# Patient Record
Sex: Female | Born: 1945
Health system: Southern US, Community
[De-identification: ages and names within clinical notes are randomized; demographics above are authoritative.]

## PROBLEM LIST (undated history)

## (undated) DIAGNOSIS — I8290 Acute embolism and thrombosis of unspecified vein: Secondary | ICD-10-CM

## (undated) DIAGNOSIS — J449 Chronic obstructive pulmonary disease, unspecified: Secondary | ICD-10-CM

## (undated) DIAGNOSIS — J45909 Unspecified asthma, uncomplicated: Secondary | ICD-10-CM

## (undated) DIAGNOSIS — I509 Heart failure, unspecified: Secondary | ICD-10-CM

## (undated) DIAGNOSIS — Z86711 Personal history of pulmonary embolism: Secondary | ICD-10-CM

## (undated) DIAGNOSIS — F319 Bipolar disorder, unspecified: Secondary | ICD-10-CM

## (undated) DIAGNOSIS — E7800 Pure hypercholesterolemia, unspecified: Secondary | ICD-10-CM

## (undated) DIAGNOSIS — F329 Major depressive disorder, single episode, unspecified: Secondary | ICD-10-CM

## (undated) DIAGNOSIS — F419 Anxiety disorder, unspecified: Secondary | ICD-10-CM

## (undated) DIAGNOSIS — F32A Depression, unspecified: Secondary | ICD-10-CM

## (undated) HISTORY — PX: EYE SURGERY: SHX253

## (undated) HISTORY — DX: Acute embolism and thrombosis of unspecified vein: I82.90

## (undated) HISTORY — DX: Depression, unspecified: F32.A

## (undated) HISTORY — PX: COLONOSCOPY: SHX174

## (undated) HISTORY — DX: Anxiety disorder, unspecified: F41.9

## (undated) HISTORY — DX: Pure hypercholesterolemia, unspecified: E78.00

## (undated) HISTORY — DX: Personal history of pulmonary embolism: Z86.711

## (undated) HISTORY — DX: Bipolar disorder, unspecified: F31.9

## (undated) HISTORY — PX: TONSILLECTOMY: SUR1361

## (undated) HISTORY — DX: Heart failure, unspecified: I50.9

## (undated) HISTORY — DX: Major depressive disorder, single episode, unspecified: F32.9

---

## 2008-01-01 ENCOUNTER — Ambulatory Visit: Payer: Self-pay | Admitting: Anesthesiology

## 2008-06-26 ENCOUNTER — Emergency Department: Payer: Self-pay | Admitting: Internal Medicine

## 2009-03-10 ENCOUNTER — Ambulatory Visit: Payer: Self-pay | Admitting: Anesthesiology

## 2011-09-11 HISTORY — PX: BREAST SURGERY: SHX581

## 2012-12-22 ENCOUNTER — Ambulatory Visit: Payer: Self-pay | Admitting: General Surgery

## 2013-01-01 ENCOUNTER — Other Ambulatory Visit: Payer: Self-pay | Admitting: *Deleted

## 2013-01-01 ENCOUNTER — Ambulatory Visit (INDEPENDENT_AMBULATORY_CARE_PROVIDER_SITE_OTHER): Payer: Medicare Other | Admitting: General Surgery

## 2013-01-01 ENCOUNTER — Encounter: Payer: Self-pay | Admitting: General Surgery

## 2013-01-01 VITALS — BP 118/60 | HR 76 | Resp 12 | Ht 59.0 in | Wt 141.0 lb

## 2013-01-01 DIAGNOSIS — Z1211 Encounter for screening for malignant neoplasm of colon: Secondary | ICD-10-CM | POA: Insufficient documentation

## 2013-01-01 MED ORDER — POLYETHYLENE GLYCOL 3350 17 GM/SCOOP PO POWD: ORAL | Status: DC

## 2013-01-01 NOTE — Progress Notes (Signed)
Patient ID: Mackenzie Ward, female   DOB: 1946/03/22, 67 y.o.   MRN: [id number]  Chief Complaint  Patient presents with  . Colonoscopy    HPI Mackenzie Ward is a 67 y.o. female here today for an evaluation for a colonoscopy screening.Patient states she had a colonoscopy about 15 years ago. HPI  Past Medical History  Diagnosis Date  . High cholesterol   . Depression     Past Surgical History  Procedure Laterality Date  . Breast surgery Right 2013  . Colonoscopy  15 years ago    Family History  Problem Relation Age of Onset  . Lymphoma Mother     Non Hodgkin  . Lymphoma Sister     Non Hodgkin    Social History History  Substance Use Topics  . Smoking status: Current Every Day Smoker -- 0.75 packs/day for 45 years  . Smokeless tobacco: Never Used  . Alcohol Use: Yes    No Known Allergies  Current Outpatient Prescriptions  Medication Sig Dispense Refill  . divalproex (DEPAKOTE) 250 MG DR tablet Take 250 mg by mouth 2 (two) times daily.      . divalproex (DEPAKOTE) 500 MG DR tablet Take 500 mg by mouth daily.      Marland Kitchen ALPRAZolam (XANAX) 0.5 MG tablet Take by mouth 2 (two) times daily.       . folic acid (FOLVITE) 1 MG tablet Take 1 mg by mouth daily.       Marland Kitchen venlafaxine XR (EFFEXOR-XR) 150 MG 24 hr capsule Take 150 mg by mouth daily.        No current facility-administered medications for this visit.    Review of Systems Review of Systems  Constitutional: Negative.   Respiratory: Negative.   Cardiovascular: Negative.   Gastrointestinal: Negative.     Blood pressure 118/60, pulse 76, resp. rate 12, height 4\' 11"  (1.499 m), weight 141 lb (63.957 kg).  Physical Exam Physical Exam  Constitutional: She appears well-developed and well-nourished.  Eyes: Conjunctivae are normal. No scleral icterus.  Neck: Normal range of motion. Neck supple.  Cardiovascular: Normal rate and normal heart sounds.   Pulmonary/Chest: Effort normal and breath sounds normal.   Abdominal: Soft. Bowel sounds are normal. There is no hepatosplenomegaly. No hernia.    Data Reviewed nil  Assessment    Screening for colon polyps/cancer     Plan    Screening colonoscopy        Mackenzie Ward 01/01/2013, 9:54 AM

## 2013-01-01 NOTE — Patient Instructions (Addendum)
Colonoscopy with possible biopsy/polypectomy prn: Information regarding the procedure, including its potential risks and complications (including but not limited to perforation of the bowel, which may require emergency surgery to repair, and bleeding) was verbally given to the patient. Educational information regarding lower instestinal endoscopy was given to the patient. Written instructions for how to complete the bowel prep using Miralax were provided. The importance of drinking ample fluids to avoid dehydration as a result of the prep emphasized.Colonoscopy A colonoscopy is an exam to evaluate your entire colon. In this exam, your colon is cleansed. A long fiberoptic tube is inserted through your rectum and into your colon. The fiberoptic scope (endoscope) is a long bundle of enclosed and very flexible fibers. These fibers transmit light to the area examined and send images from that area to your caregiver. Discomfort is usually minimal. You may be given a drug to help you sleep (sedative) during or prior to the procedure. This exam helps to detect lumps (tumors), polyps, inflammation, and areas of bleeding. Your caregiver may also take a small piece of tissue (biopsy) that will be examined under a microscope. LET YOUR CAREGIVER KNOW ABOUT:   Allergies to food or medicine.  Medicines taken, including vitamins, herbs, eyedrops, over-the-counter medicines, and creams.  Use of steroids (by mouth or creams).  Previous problems with anesthetics or numbing medicines.  History of bleeding problems or blood clots.  Previous surgery.  Other health problems, including diabetes and kidney problems.  Possibility of pregnancy, if this applies. BEFORE THE PROCEDURE   A clear liquid diet may be required for 2 days before the exam.  Ask your caregiver about changing or stopping your regular medications.  Liquid injections (enemas) or laxatives may be required.  A large amount of electrolyte solution  may be given to you to drink over a short period of time. This solution is used to clean out your colon.  You should be present 60 minutes prior to your procedure or as directed by your caregiver. AFTER THE PROCEDURE   If you received a sedative or pain relieving medication, you will need to arrange for someone to drive you home.  Occasionally, there is a little blood passed with the first bowel movement. Do not be concerned. FINDING OUT THE RESULTS OF YOUR TEST Not all test results are available during your visit. If your test results are not back during the visit, make an appointment with your caregiver to find out the results. Do not assume everything is normal if you have not heard from your caregiver or the medical facility. It is important for you to follow up on all of your test results. HOME CARE INSTRUCTIONS   It is not unusual to pass moderate amounts of gas and experience mild abdominal cramping following the procedure. This is due to air being used to inflate your colon during the exam. Walking or a warm pack on your belly (abdomen) may help.  You may resume all normal meals and activities after sedatives and medicines have worn off.  Only take over-the-counter or prescription medicines for pain, discomfort, or fever as directed by your caregiver. Do not use aspirin or blood thinners if a biopsy was taken. Consult your caregiver for medicine usage if biopsies were taken. SEEK IMMEDIATE MEDICAL CARE IF:   You have a fever.  You pass large blood clots or fill a toilet with blood following the procedure. This may also occur 10 to 14 days following the procedure. This is more likely if a  biopsy was taken.  You develop abdominal pain that keeps getting worse and cannot be relieved with medicine. Document Released: 08/24/2000 Document Revised: 11/19/2011 Document Reviewed: 04/08/2008 [facility] Patient Information 2013 [place], [place].

## 2013-01-01 NOTE — Progress Notes (Signed)
Patient has been scheduled for a colonoscopy on 01-14-13 at [facility].

## 2013-01-05 ENCOUNTER — Other Ambulatory Visit: Payer: Self-pay | Admitting: General Surgery

## 2013-01-05 DIAGNOSIS — Z1211 Encounter for screening for malignant neoplasm of colon: Secondary | ICD-10-CM

## 2013-01-14 ENCOUNTER — Ambulatory Visit: Payer: Self-pay | Admitting: General Surgery

## 2013-01-14 DIAGNOSIS — D128 Benign neoplasm of rectum: Secondary | ICD-10-CM

## 2013-01-14 DIAGNOSIS — D126 Benign neoplasm of colon, unspecified: Secondary | ICD-10-CM

## 2013-01-14 DIAGNOSIS — Z1211 Encounter for screening for malignant neoplasm of colon: Secondary | ICD-10-CM

## 2013-01-14 DIAGNOSIS — D129 Benign neoplasm of anus and anal canal: Secondary | ICD-10-CM

## 2013-01-15 LAB — PATHOLOGY REPORT

## 2013-01-19 ENCOUNTER — Encounter: Payer: Self-pay | Admitting: General Surgery

## 2013-06-30 ENCOUNTER — Ambulatory Visit: Payer: Self-pay | Admitting: Family Medicine

## 2014-07-12 ENCOUNTER — Encounter: Payer: Self-pay | Admitting: General Surgery

## 2014-07-27 ENCOUNTER — Ambulatory Visit: Payer: Self-pay | Admitting: Family Medicine

## 2014-07-28 ENCOUNTER — Ambulatory Visit: Payer: Self-pay | Admitting: Family Medicine

## 2014-08-03 ENCOUNTER — Ambulatory Visit: Payer: Self-pay | Admitting: Family Medicine

## 2015-01-03 LAB — SURGICAL PATHOLOGY

## 2015-05-04 ENCOUNTER — Ambulatory Visit (INDEPENDENT_AMBULATORY_CARE_PROVIDER_SITE_OTHER): Payer: Medicare Other | Admitting: Family Medicine

## 2015-05-04 ENCOUNTER — Encounter: Payer: Self-pay | Admitting: Family Medicine

## 2015-05-04 VITALS — BP 105/71 | HR 93 | Temp 97.7°F | Resp 16 | Ht 59.0 in | Wt 146.2 lb

## 2015-05-04 DIAGNOSIS — F32A Depression, unspecified: Secondary | ICD-10-CM

## 2015-05-04 DIAGNOSIS — E785 Hyperlipidemia, unspecified: Secondary | ICD-10-CM

## 2015-05-04 DIAGNOSIS — F329 Major depressive disorder, single episode, unspecified: Secondary | ICD-10-CM

## 2015-05-04 DIAGNOSIS — J302 Other seasonal allergic rhinitis: Secondary | ICD-10-CM

## 2015-05-04 MED ORDER — SIMVASTATIN 20 MG PO TABS: 20.0000 mg | ORAL_TABLET | Freq: Every day | ORAL | Status: DC

## 2015-05-04 MED ORDER — FLUTICASONE PROPIONATE 50 MCG/ACT NA SUSP: 2.0000 | Freq: Every day | NASAL | Status: DC

## 2015-05-04 MED ORDER — CLONAZEPAM 1 MG PO TABS: 1.0000 mg | ORAL_TABLET | Freq: Three times a day (TID) | ORAL | Status: DC | PRN

## 2015-05-04 MED ORDER — VENLAFAXINE HCL ER 75 MG PO CP24: ORAL_CAPSULE | ORAL | Status: DC

## 2015-05-04 MED ORDER — LORATADINE 10 MG PO TABS: 10.0000 mg | ORAL_TABLET | Freq: Every day | ORAL | Status: DC

## 2015-05-04 NOTE — Progress Notes (Signed)
Name: Mackenzie Ward   MRN: [id number]    DOB: 1946/07/22   Date:05/04/2015       Progress Note  Subjective  Chief Complaint  Chief Complaint  Patient presents with  . Anxiety  . Cough    HPI  Here for f/u of depression.  That is a little worse because of her mother having worsening dementia.  She has hyperventilated some. Also c/o a throaty pnd cough.  No problem-specific assessment & plan notes found for this encounter.   Past Medical History  Diagnosis Date  . High cholesterol   . Depression     Social History  Substance Use Topics  . Smoking status: Current Every Day Smoker -- 0.75 packs/day for 45 years  . Smokeless tobacco: Never Used  . Alcohol Use: Yes     Current outpatient prescriptions:  .  clonazePAM (KLONOPIN) 0.5 MG tablet, , Disp: , Rfl:  .  simvastatin (ZOCOR) 20 MG tablet, , Disp: , Rfl:  .  venlafaxine XR (EFFEXOR-XR) 75 MG 24 hr capsule, , Disp: , Rfl:   No Known Allergies  Review of Systems  Constitutional: Negative for fever and chills.  HENT: Positive for congestion. Negative for hearing loss.   Eyes: Negative for blurred vision and double vision.  Respiratory: Negative for cough, sputum production, shortness of breath and wheezing.   Cardiovascular: Negative for chest pain, palpitations, orthopnea and leg swelling.  Gastrointestinal: Negative for heartburn, nausea, vomiting, abdominal pain and blood in stool.  Genitourinary: Negative for dysuria, urgency and frequency.  Musculoskeletal: Negative for myalgias and joint pain.  Skin: Negative for rash.  Neurological: Negative for headaches.  Psychiatric/Behavioral: Positive for depression. The patient is nervous/anxious.        Objective  Filed Vitals:   05/04/15 0911  BP: 105/71  Pulse: 93  Temp: 97.7 F (36.5 C)  Resp: 16  Height: 4\' 11"  (1.499 m)  Weight: 146 lb 3.2 oz (66.316 kg)     Physical Exam  Constitutional: She is oriented to person, place, and time and  well-developed, well-nourished, and in no distress. No distress.  HENT:  Clear post nasal drainage.  Neck: Normal range of motion. Neck supple. Carotid bruit is not present. No thyromegaly present.  Cardiovascular: Normal rate, regular rhythm, normal heart sounds and intact distal pulses.  Exam reveals no gallop and no friction rub.   No murmur heard. Pulmonary/Chest: Effort normal and breath sounds normal. No respiratory distress. She has no wheezes. She has no rales.  Abdominal: Soft. Bowel sounds are normal. She exhibits no distension and no mass. There is no tenderness.  Musculoskeletal: She exhibits no edema.  Lymphadenopathy:    She has no cervical adenopathy.  Neurological: She is alert and oriented to person, place, and time. No cranial nerve deficit.  Psychiatric:  S\omewhat anxious and depressed.  PHQ-9 score of 12  Vitals reviewed.      No results found for this or any previous visit (from the past 2160 hour(s)).   Assessment & Plan  1. Depression   - clonazePAM (KLONOPIN) 1 MG tablet; Take 1 tablet (1 mg total) by mouth 3 (three) times daily as needed for anxiety.  Dispense: 90 tablet; Refill: 5 - venlafaxine XR (EFFEXOR-XR) 75 MG 24 hr capsule; Take 3 capsules in the AM  Dispense: 90 capsule; Refill: 6  2. Hyperlipidemia   - simvastatin (ZOCOR) 20 MG tablet; Take 1 tablet (20 mg total) by mouth daily at 6 PM.  Dispense: 90 tablet; Refill:  3  3. Seasonal allergies   - fluticasone (FLONASE) 50 MCG/ACT nasal spray; Place 2 sprays into both nostrils daily.  Dispense: 16 g; Refill: 6 - loratadine (CLARITIN) 10 MG tablet; Take 1 tablet (10 mg total) by mouth daily.  Dispense: 30 tablet; Refill: 11

## 2015-06-20 ENCOUNTER — Ambulatory Visit (INDEPENDENT_AMBULATORY_CARE_PROVIDER_SITE_OTHER): Payer: Medicare Other | Admitting: Family Medicine

## 2015-06-20 ENCOUNTER — Ambulatory Visit
Admission: RE | Admit: 2015-06-20 | Discharge: 2015-06-20 | Disposition: A | Discharge status: Home / Self Care | Payer: Medicare Other | Source: Ambulatory Visit | Attending: Family Medicine | Admitting: Family Medicine

## 2015-06-20 ENCOUNTER — Encounter: Payer: Self-pay | Admitting: Family Medicine

## 2015-06-20 VITALS — BP 106/70 | HR 82 | Temp 97.8°F | Resp 16 | Ht 59.0 in | Wt 152.2 lb

## 2015-06-20 DIAGNOSIS — R5382 Chronic fatigue, unspecified: Secondary | ICD-10-CM | POA: Insufficient documentation

## 2015-06-20 DIAGNOSIS — R109 Unspecified abdominal pain: Secondary | ICD-10-CM | POA: Insufficient documentation

## 2015-06-20 DIAGNOSIS — F329 Major depressive disorder, single episode, unspecified: Secondary | ICD-10-CM | POA: Insufficient documentation

## 2015-06-20 DIAGNOSIS — M79671 Pain in right foot: Secondary | ICD-10-CM

## 2015-06-20 DIAGNOSIS — S92351A Displaced fracture of fifth metatarsal bone, right foot, initial encounter for closed fracture: Secondary | ICD-10-CM | POA: Insufficient documentation

## 2015-06-20 DIAGNOSIS — D171 Benign lipomatous neoplasm of skin and subcutaneous tissue of trunk: Secondary | ICD-10-CM | POA: Insufficient documentation

## 2015-06-20 DIAGNOSIS — Z23 Encounter for immunization: Secondary | ICD-10-CM

## 2015-06-20 DIAGNOSIS — F413 Other mixed anxiety disorders: Secondary | ICD-10-CM | POA: Insufficient documentation

## 2015-06-20 DIAGNOSIS — X58XXXA Exposure to other specified factors, initial encounter: Secondary | ICD-10-CM | POA: Diagnosis not present

## 2015-06-20 DIAGNOSIS — F419 Anxiety disorder, unspecified: Secondary | ICD-10-CM

## 2015-06-20 MED ORDER — MELOXICAM 7.5 MG PO TABS: ORAL_TABLET | ORAL | Status: DC

## 2015-06-20 NOTE — Patient Instructions (Signed)
Stay off foot as much as possible at this time.

## 2015-06-20 NOTE — Addendum Note (Signed)
Addended by: Theresia Majors A on: 06/20/2015 11:43 AM   Modules accepted: Orders

## 2015-06-20 NOTE — Progress Notes (Signed)
Name: Mackenzie Ward   MRN: [id number]    DOB: [birth year]   Date:06/20/2015       Progress Note  Subjective  Chief Complaint  Chief Complaint  Patient presents with  . Foot Injury    right foot-Fall Injury    HPI C/o R. Foot injury about 1 month ago.  Probably twisted foot.  Has had lat. Forefoot pain since. Also c/o pain in lat. Side of foot.  States she gets extremely fatigued with any activity and wonders if it is her feet.  No problem-specific assessment & plan notes found for this encounter.   Past Medical History  Diagnosis Date  . High cholesterol   . Depression     Social History  Substance Use Topics  . Smoking status: Current Every Day Smoker -- 1.00 packs/day for 45 years    Types: Cigarettes  . Smokeless tobacco: Never Used  . Alcohol Use: 0.0 oz/week    0 Standard drinks or equivalent per week     Current outpatient prescriptions:  .  clonazePAM (KLONOPIN) 1 MG tablet, Take 1 tablet (1 mg total) by mouth 3 (three) times daily as needed for anxiety., Disp: 90 tablet, Rfl: 5 .  fluticasone (FLONASE) 50 MCG/ACT nasal spray, Place 2 sprays into both nostrils daily., Disp: 16 g, Rfl: 6 .  loratadine (CLARITIN) 10 MG tablet, Take 1 tablet (10 mg total) by mouth daily., Disp: 30 tablet, Rfl: 11 .  meloxicam (MOBIC) 7.5 MG tablet, Take 7.5 mg by mouth as needed for pain., Disp: , Rfl:  .  menthol-zinc oxide (GOLD BOND) powder, GOLD BOND (External Powder)  1 (one) Powder Powder apply under breast to keep dry for 0 days  Quantity: 1;  Refills: 0   Ordered :17-March-2014  Larene Beach MD;  Started 11-Nov-2013 Active, Disp: , Rfl:  .  simvastatin (ZOCOR) 20 MG tablet, Take 1 tablet (20 mg total) by mouth daily at 6 PM., Disp: 90 tablet, Rfl: 3 .  venlafaxine XR (EFFEXOR-XR) 75 MG 24 hr capsule, Take 3 capsules in the AM, Disp: 90 capsule, Rfl: 6 .  folic acid (FOLVITE) 1 MG tablet, Take by mouth., Disp: , Rfl:   No Known Allergies  Review of Systems   Constitutional: Positive for malaise/fatigue. Negative for fever, chills and weight loss.  HENT: Negative for hearing loss.   Eyes: Negative for blurred vision and double vision.  Respiratory: Positive for cough (improved). Negative for shortness of breath and wheezing.   Cardiovascular: Negative for chest pain, palpitations, orthopnea and leg swelling.  Gastrointestinal: Positive for abdominal pain and blood in stool. Negative for heartburn.  Genitourinary: Negative for dysuria, urgency and frequency.  Musculoskeletal: Positive for joint pain (R foot).  Neurological: Negative for weakness and headaches.      Objective  Filed Vitals:   06/20/15 1048  BP: 106/70  Pulse: 82  Temp: 97.8 F (36.6 C)  Resp: 16  Height: 4\' 11"  (1.499 m)  Weight: 152 lb 3.2 oz (69.037 kg)     Physical Exam  Constitutional: She is oriented to person, place, and time and well-developed, well-nourished, and in no distress. No distress.  HENT:  Head: Normocephalic and atraumatic.  Neck: Normal range of motion. Neck supple. Carotid bruit is not present. No thyromegaly present.  Cardiovascular: Normal rate, regular rhythm and normal heart sounds.  Exam reveals no gallop and no friction rub.   No murmur heard. Pulses:      Dorsalis pedis pulses are 1+ on the  right side, and 1+ on the left side.       Posterior tibial pulses are 1+ on the right side, and 1+ on the left side.  Pulmonary/Chest: Effort normal and breath sounds normal. No respiratory distress. She has no wheezes. She has no rales.  Musculoskeletal: She exhibits no edema.  R foot tender over 5th metacarpal and somewhat over 4th metacarpal.   No gross bony abnormality.  Lymphadenopathy:    She has no cervical adenopathy.  Neurological: She is alert and oriented to person, place, and time.  Vitals reviewed.     No results found for this or any previous visit (from the past 2160 hour(s)).   Assessment & Plan  1. Need for influenza  vaccination  - Flu vaccine HIGH DOSE PF (Fluzone High dose)  2. Right foot pain  - DG Foot Complete Right; Future - meloxicam (MOBIC) 7.5 MG tablet; 1-2 tabs daily for foot pain  Dispense: 30 tablet; Refill: 6

## 2015-07-05 ENCOUNTER — Ambulatory Visit (INDEPENDENT_AMBULATORY_CARE_PROVIDER_SITE_OTHER): Payer: Medicare Other | Admitting: Family Medicine

## 2015-07-05 ENCOUNTER — Encounter: Payer: Self-pay | Admitting: Family Medicine

## 2015-07-05 VITALS — BP 114/73 | HR 91 | Temp 97.9°F | Resp 16 | Ht 59.0 in | Wt 154.6 lb

## 2015-07-05 DIAGNOSIS — F411 Generalized anxiety disorder: Secondary | ICD-10-CM

## 2015-07-05 DIAGNOSIS — F329 Major depressive disorder, single episode, unspecified: Secondary | ICD-10-CM

## 2015-07-05 DIAGNOSIS — F32A Depression, unspecified: Secondary | ICD-10-CM

## 2015-07-05 MED ORDER — VORTIOXETINE HBR 10 MG PO TABS: 10.0000 mg | ORAL_TABLET | Freq: Every morning | ORAL | Status: DC

## 2015-07-05 NOTE — Progress Notes (Signed)
Name: Mackenzie Ward   MRN: [id number]    DOB: 12-15-1945   Date:07/05/2015       Progress Note  Subjective  Chief Complaint  Chief Complaint  Patient presents with  . Depression    getting worst     HPI  Here for f/u of depression.  Feels that she is getting worse.  Her mother's health is getting much worse.  PHQ-9 score up to 22 (from 17). No problem-specific assessment & plan notes found for this encounter.   Past Medical History  Diagnosis Date  . High cholesterol   . Depression     Social History  Substance Use Topics  . Smoking status: Current Every Day Smoker -- 1.00 packs/day for 45 years    Types: Cigarettes  . Smokeless tobacco: Never Used  . Alcohol Use: No     Current outpatient prescriptions:  .  clonazePAM (KLONOPIN) 1 MG tablet, Take 1 tablet (1 mg total) by mouth 3 (three) times daily as needed for anxiety., Disp: 90 tablet, Rfl: 5 .  fluticasone (FLONASE) 50 MCG/ACT nasal spray, Place 2 sprays into both nostrils daily., Disp: 16 g, Rfl: 6 .  loratadine (CLARITIN) 10 MG tablet, Take 1 tablet (10 mg total) by mouth daily., Disp: 30 tablet, Rfl: 11 .  meloxicam (MOBIC) 7.5 MG tablet, 1-2 tabs daily for foot pain, Disp: 30 tablet, Rfl: 6 .  simvastatin (ZOCOR) 20 MG tablet, Take 1 tablet (20 mg total) by mouth daily at 6 PM., Disp: 90 tablet, Rfl: 3 .  venlafaxine (EFFEXOR) 75 MG tablet, Take 75 mg by mouth every morning. Take 3 capsule every morning, Disp: , Rfl:   No Known Allergies  Review of Systems  Constitutional: Negative for fever, chills, weight loss and malaise/fatigue.  HENT: Negative for hearing loss.   Eyes: Negative for blurred vision and double vision.  Respiratory: Negative for cough, sputum production and shortness of breath.   Cardiovascular: Negative for chest pain, palpitations, orthopnea and leg swelling.  Gastrointestinal: Negative for heartburn, abdominal pain and blood in stool.  Genitourinary: Negative for dysuria, urgency and  frequency.  Musculoskeletal: Negative for myalgias and joint pain.  Neurological: Negative for dizziness, tremors, weakness and headaches.  Psychiatric/Behavioral: Positive for depression. The patient is nervous/anxious.       Objective  Filed Vitals:   07/05/15 0939  BP: 114/73  Pulse: 91  Temp: 97.9 F (36.6 C)  TempSrc: Oral  Resp: 16  Height: 4\' 11"  (1.499 m)  Weight: 154 lb 9.6 oz (70.126 kg)     Physical Exam  Constitutional: She is oriented to person, place, and time and well-developed, well-nourished, and in no distress. No distress.  HENT:  Head: Normocephalic and atraumatic.  Neck: Normal range of motion. Neck supple. No thyromegaly present.  Cardiovascular: Normal rate, regular rhythm, normal heart sounds and intact distal pulses.  Exam reveals no gallop and no friction rub.   No murmur heard. Pulmonary/Chest: Effort normal and breath sounds normal. No respiratory distress. She has no wheezes. She has no rales.  Abdominal: Soft. Bowel sounds are normal. She exhibits no distension and no mass. There is no tenderness.  Musculoskeletal: Normal range of motion. She exhibits no edema.  Lymphadenopathy:    She has no cervical adenopathy.  Neurological: She is alert and oriented to person, place, and time.  Psychiatric:  Affect is depressed.  Motivation is poor.  Memory is less than she would like.  Vitals reviewed.     No results  found for this or any previous visit (from the past 2160 hour(s)).   Assessment & Plan  1. Depression  - Vortioxetine HBr (TRINTELLIX) 10 MG TABS; Take 1 tablet (10 mg total) by mouth every morning.  Dispense: 30 tablet; Refill: 6 -stop Effexor per taper provided before starting this new medication. 2. Generalized anxiety disorder

## 2015-07-05 NOTE — Patient Instructions (Signed)
Taper off Effexor, 2 a day for 1 week, 1 a day for 1 week, 1 every other day for 1 week, then none x 4 days.  Then Start Trintellix 10 mg. 1 daily.

## 2015-08-24 ENCOUNTER — Ambulatory Visit (INDEPENDENT_AMBULATORY_CARE_PROVIDER_SITE_OTHER): Payer: Medicare Other | Admitting: Podiatry

## 2015-08-24 ENCOUNTER — Encounter: Payer: Self-pay | Admitting: Podiatry

## 2015-08-24 ENCOUNTER — Ambulatory Visit (INDEPENDENT_AMBULATORY_CARE_PROVIDER_SITE_OTHER): Payer: Medicare Other

## 2015-08-24 DIAGNOSIS — M7662 Achilles tendinitis, left leg: Secondary | ICD-10-CM

## 2015-08-24 DIAGNOSIS — M2141 Flat foot [pes planus] (acquired), right foot: Secondary | ICD-10-CM

## 2015-08-24 DIAGNOSIS — M79676 Pain in unspecified toe(s): Secondary | ICD-10-CM | POA: Diagnosis not present

## 2015-08-24 DIAGNOSIS — R29818 Other symptoms and signs involving the nervous system: Secondary | ICD-10-CM | POA: Diagnosis not present

## 2015-08-24 DIAGNOSIS — M2142 Flat foot [pes planus] (acquired), left foot: Secondary | ICD-10-CM | POA: Diagnosis not present

## 2015-08-24 DIAGNOSIS — M7661 Achilles tendinitis, right leg: Secondary | ICD-10-CM

## 2015-08-24 DIAGNOSIS — R2689 Other abnormalities of gait and mobility: Secondary | ICD-10-CM

## 2015-08-24 DIAGNOSIS — B351 Tinea unguium: Secondary | ICD-10-CM

## 2015-08-24 DIAGNOSIS — M722 Plantar fascial fibromatosis: Secondary | ICD-10-CM | POA: Diagnosis not present

## 2015-08-24 NOTE — Progress Notes (Signed)
   Subjective:    Patient ID: Mackenzie Ward, female    DOB: 03/02/1946, 69 y.o.   MRN: [id number]  HPI: She presents today as a 69 year old female complaining of loss of balance and recent falls. 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 She states that she has tried many over-the-counter insole because she feels that her feet are flat and this could be causing her to lose her balance. She also states that her toenails are grossly elongated and she was unable to cut them because of their thickness.    Review of Systems  Constitutional: Positive for fatigue.  Musculoskeletal: Positive for gait problem.  Neurological: Positive for dizziness, weakness and light-headedness.  All other systems reviewed and are negative.      Objective:   Physical Exam: 69 year old female stress vital signs stable alert and oriented 3. Pulses are strongly palpable. Neurologic sensorium is intact. Negative Romberg's. Deep tendon reflexes are intact and muscle strength is normal bilateral. Orthopedic evaluation demonstrates pain on palpation to the Achilles tendon and its insertion site of the left heel with some edema in the pre-Achilles fat pad. She also has some tenderness along the medial aspect of the ankle extending into the posterior tibial tendon left. Right foot demonstrates pain on palpation medial calcaneal tubercle of the right heel. Cutaneous evaluation of Mr. supple hydrated cutis nails that are thick yellow dystrophic, mycotic and painful palpation as well as debridement. She also has soft tissue increase in density at the Achilles insertion site as well as the plantar fascial calcaneal insertion site left foot and right foot respectively.        Assessment & Plan:  Assessment: Achilles tendinitis insertional in nature left. Plantar fasciitis right foot. Pain limb  secondary to onychomycosis 1 through 5 bilateral.  Plan: Discussed etiology pathology conservative versus surgical therapies. Injected Kenalog to the pre-Achilles fat pad area more located toward the medial ankle then posteriorly I also injected the plantar fascial calcaneal insertion site right foot. Debrided toenails 1 through 5 bilateral. And I will follow-up with her in 1 month's period

## 2015-09-21 ENCOUNTER — Ambulatory Visit (INDEPENDENT_AMBULATORY_CARE_PROVIDER_SITE_OTHER): Payer: Medicare HMO | Admitting: Podiatry

## 2015-09-21 ENCOUNTER — Encounter: Payer: Self-pay | Admitting: Podiatry

## 2015-09-21 VITALS — BP 115/74 | HR 96 | Resp 18

## 2015-09-21 DIAGNOSIS — M7661 Achilles tendinitis, right leg: Secondary | ICD-10-CM

## 2015-09-21 DIAGNOSIS — M7662 Achilles tendinitis, left leg: Secondary | ICD-10-CM

## 2015-09-21 DIAGNOSIS — M722 Plantar fascial fibromatosis: Secondary | ICD-10-CM | POA: Diagnosis not present

## 2015-09-21 NOTE — Progress Notes (Signed)
She presents today for follow-up of her plantar fasciitis and states that a sickly her feet are doing better with exception of the right one. She is going to follow up with her primary provider tomorrow regarding her balance.  Objective: Vital signs are stable alert and oriented 3. Pulses are palpable. She has pain on palpation of the right heel.  Assessment: Balance issues. Pain in limb secondary to plantar fasciitis right foot.  Plan: Discussed etiology and pathology conservative surgical therapy. Injected right heel again today with Kenalog and local anesthetic follow-up with her in 1 month if needed encouraged continued anti-inflammatories.

## 2015-09-22 ENCOUNTER — Ambulatory Visit (INDEPENDENT_AMBULATORY_CARE_PROVIDER_SITE_OTHER): Payer: Medicare HMO | Admitting: Family Medicine

## 2015-09-22 ENCOUNTER — Encounter: Payer: Self-pay | Admitting: Family Medicine

## 2015-09-22 VITALS — BP 122/80 | HR 83 | Temp 97.4°F | Resp 16 | Ht 59.0 in | Wt 156.0 lb

## 2015-09-22 DIAGNOSIS — F32A Depression, unspecified: Secondary | ICD-10-CM

## 2015-09-22 DIAGNOSIS — F413 Other mixed anxiety disorders: Secondary | ICD-10-CM

## 2015-09-22 DIAGNOSIS — F329 Major depressive disorder, single episode, unspecified: Secondary | ICD-10-CM

## 2015-09-22 MED ORDER — FLUOXETINE HCL 20 MG PO TABS: 20.0000 mg | ORAL_TABLET | Freq: Every day | ORAL | Status: DC

## 2015-09-22 NOTE — Progress Notes (Signed)
Name: Mackenzie Ward   MRN: [id number]    DOB: 04/03/1946   Date:09/22/2015       Progress Note  Subjective  Chief Complaint  Chief Complaint  Patient presents with  . Depression    HPI  Here for f/u of Depression and agitation.  She has been on Trintellix and feels more irritable and agitated.  Does not want to take it anymore.    Also c/o bilateral feet hurting.  Seeing Dr. Jens Som at [facility]. No problem-specific assessment & plan notes found for this encounter.   Past Medical History  Diagnosis Date  . High cholesterol   . Depression     Past Surgical History  Procedure Laterality Date  . Breast surgery Right 2013  . Colonoscopy  15 years ago    Family History  Problem Relation Age of Onset  . Lymphoma Mother     Non Hodgkin  . Lymphoma Sister     Non Hodgkin    Social History   Social History  . Marital Status: Married    Spouse Name: N/A  . Number of Children: N/A  . Years of Education: N/A   Occupational History  . Not on file.   Social History Main Topics  . Smoking status: Current Every Day Smoker -- 1.00 packs/day for 45 years    Types: Cigarettes  . Smokeless tobacco: Never Used  . Alcohol Use: No  . Drug Use: No  . Sexual Activity: Not on file   Other Topics Concern  . Not on file   Social History Narrative     Current outpatient prescriptions:  .  clonazePAM (KLONOPIN) 1 MG tablet, Take 1 tablet (1 mg total) by mouth 3 (three) times daily as needed for anxiety., Disp: 90 tablet, Rfl: 5 .  FLUoxetine (PROZAC) 20 MG tablet, Take 1 tablet (20 mg total) by mouth daily., Disp: 30 tablet, Rfl: 6 .  fluticasone (FLONASE) 50 MCG/ACT nasal spray, Place 2 sprays into both nostrils daily., Disp: 16 g, Rfl: 6 .  folic acid (FOLVITE) 1 MG tablet, Take by mouth., Disp: , Rfl:  .  loratadine (CLARITIN) 10 MG tablet, Take 1 tablet (10 mg total) by mouth daily., Disp: 30 tablet, Rfl: 11 .  meloxicam (MOBIC) 7.5 MG tablet, 1-2 tabs daily for  foot pain, Disp: 30 tablet, Rfl: 6 .  menthol-zinc oxide (GOLD BOND) powder, GOLD BOND (External Powder) 1 (one) Powder Powder apply under breast to keep dry for 0 days Quantity: 1;  Refills: 0 Ordered :17-March-2014 Larene Beach MD;  Started 4-Mar-2015Active, Disp: , Rfl:   Not on File   Review of Systems  Constitutional: Negative for fever, chills, weight loss and malaise/fatigue.  HENT: Negative for hearing loss.   Eyes: Negative for blurred vision and double vision.  Respiratory: Negative for cough, shortness of breath and wheezing.   Cardiovascular: Negative for chest pain, palpitations and leg swelling.  Gastrointestinal: Negative for heartburn, abdominal pain and blood in stool.  Genitourinary: Negative for dysuria, urgency and frequency.  Skin: Negative for rash.  Neurological: Negative for dizziness, tremors, weakness and headaches.  Psychiatric/Behavioral: Positive for depression. The patient is nervous/anxious.       Objective  Filed Vitals:   09/22/15 1015  BP: 122/80  Pulse: 83  Temp: 97.4 F (36.3 C)  TempSrc: Oral  Resp: 16  Height: 4\' 11"  (1.499 m)  Weight: 156 lb (70.761 kg)    Physical Exam  Constitutional: She is oriented to person, place,  and time and well-developed, well-nourished, and in no distress. No distress.  HENT:  Head: Normocephalic and atraumatic.  Eyes: Conjunctivae and EOM are normal. Pupils are equal, round, and reactive to light. No scleral icterus.  Neck: Normal range of motion. Neck supple. Carotid bruit is not present. No thyromegaly present.  Cardiovascular: Normal rate, regular rhythm and normal heart sounds.  Exam reveals no gallop and no friction rub.   No murmur heard. Pulmonary/Chest: Effort normal and breath sounds normal. No respiratory distress. She has no wheezes. She has no rales.  Abdominal: Soft. Bowel sounds are normal. She exhibits no distension, no abdominal bruit and no mass. There is no tenderness.  Musculoskeletal:  She exhibits no edema.  Lymphadenopathy:    She has no cervical adenopathy.  Neurological: She is alert and oriented to person, place, and time.  Psychiatric:  Affect depressed.  No suicidal ideations.  Vitals reviewed.      No results found for this or any previous visit (from the past 2160 hour(s)).   Assessment & Plan  Problem List Items Addressed This Visit      Other   Depression - Primary   Relevant Medications   FLUoxetine (PROZAC) 20 MG tablet   Anxiety disorder   Relevant Medications   FLUoxetine (PROZAC) 20 MG tablet      Meds ordered this encounter  Medications  . FLUoxetine (PROZAC) 20 MG tablet    Sig: Take 1 tablet (20 mg total) by mouth daily.    Dispense:  30 tablet    Refill:  6

## 2015-11-02 ENCOUNTER — Other Ambulatory Visit: Payer: Self-pay | Admitting: Family Medicine

## 2015-11-07 ENCOUNTER — Ambulatory Visit: Payer: Medicare Other | Admitting: Family Medicine

## 2015-11-08 ENCOUNTER — Encounter: Payer: Self-pay | Admitting: Family Medicine

## 2015-11-08 ENCOUNTER — Ambulatory Visit (INDEPENDENT_AMBULATORY_CARE_PROVIDER_SITE_OTHER): Payer: Medicare HMO | Admitting: Family Medicine

## 2015-11-08 VITALS — BP 116/77 | HR 66 | Temp 97.8°F | Resp 16 | Wt 153.6 lb

## 2015-11-08 DIAGNOSIS — F329 Major depressive disorder, single episode, unspecified: Secondary | ICD-10-CM | POA: Diagnosis not present

## 2015-11-08 DIAGNOSIS — F419 Anxiety disorder, unspecified: Principal | ICD-10-CM

## 2015-11-08 DIAGNOSIS — F32A Depression, unspecified: Secondary | ICD-10-CM

## 2015-11-08 DIAGNOSIS — F418 Other specified anxiety disorders: Secondary | ICD-10-CM | POA: Diagnosis not present

## 2015-11-08 DIAGNOSIS — F413 Other mixed anxiety disorders: Secondary | ICD-10-CM

## 2015-11-08 MED ORDER — CLONAZEPAM 1 MG PO TABS: ORAL_TABLET | ORAL | Status: DC

## 2015-11-08 MED ORDER — FLUOXETINE HCL 20 MG PO TABS: 20.0000 mg | ORAL_TABLET | Freq: Every day | ORAL | Status: DC

## 2015-11-08 NOTE — Progress Notes (Signed)
Name: Mackenzie Ward   MRN: [id number]    DOB: 12/30/1945   Date:11/08/2015       Progress Note  Subjective  Chief Complaint  Chief Complaint  Patient presents with  . Depression    Follow up    HPI Here for f/o of depression with anxiety.  Her agitation is doing much better.  Prozac seems to help more than any other antidepressant.  She feels much better about herself and her situation.  Ranks herself as 7/10..  This is improved.  Stiul having some problem with concentration.  No problem-specific assessment & plan notes found for this encounter.   Past Medical History  Diagnosis Date  . High cholesterol   . Depression     Past Surgical History  Procedure Laterality Date  . Breast surgery Right 2013  . Colonoscopy  15 years ago    Family History  Problem Relation Age of Onset  . Lymphoma Mother     Non Hodgkin  . Lymphoma Sister     Non Hodgkin    Social History   Social History  . Marital Status: Married    Spouse Name: N/A  . Number of Children: N/A  . Years of Education: N/A   Occupational History  . Not on file.   Social History Main Topics  . Smoking status: Current Every Day Smoker -- 1.00 packs/day for 45 years    Types: Cigarettes  . Smokeless tobacco: Never Used  . Alcohol Use: No  . Drug Use: No  . Sexual Activity: Not on file   Other Topics Concern  . Not on file   Social History Narrative     Current outpatient prescriptions:  .  clonazePAM (KLONOPIN) 1 MG tablet, Take 1/2 tablet twice a day and 1 at  Bedtime., Disp: 60 tablet, Rfl: 5 .  FLUoxetine (PROZAC) 20 MG tablet, Take 1 tablet (20 mg total) by mouth daily., Disp: 90 tablet, Rfl: 3 .  loratadine (CLARITIN) 10 MG tablet, Take 1 tablet (10 mg total) by mouth daily., Disp: 30 tablet, Rfl: 11 .  fluticasone (FLONASE) 50 MCG/ACT nasal spray, Place 2 sprays into both nostrils daily. (Patient not taking: Reported on 11/08/2015), Disp: 16 g, Rfl: 6 .  folic acid (FOLVITE) 1 MG tablet,  Take by mouth. Reported on 11/08/2015, Disp: , Rfl:  .  meloxicam (MOBIC) 7.5 MG tablet, 1-2 tabs daily for foot pain (Patient not taking: Reported on 11/08/2015), Disp: 30 tablet, Rfl: 6 .  menthol-zinc oxide (GOLD BOND) powder, Reported on 11/08/2015, Disp: , Rfl:   Not on File   Review of Systems  Constitutional: Negative for fever, chills, weight loss and malaise/fatigue.  HENT: Negative for hearing loss.   Eyes: Negative for blurred vision and double vision.  Respiratory: Negative for cough, shortness of breath and wheezing.   Cardiovascular: Negative for chest pain, palpitations and leg swelling.  Gastrointestinal: Negative for heartburn, abdominal pain and blood in stool.  Genitourinary: Negative for dysuria, urgency and frequency.  Musculoskeletal: Negative for myalgias and joint pain.       Foot pain/tendonitis  Skin: Negative for rash.  Neurological: Negative for weakness and headaches.  Psychiatric/Behavioral: Positive for depression. The patient is nervous/anxious.       Objective  Filed Vitals:   11/08/15 0906  BP: 116/77  Pulse: 66  Temp: 97.8 F (36.6 C)  TempSrc: Oral  Resp: 16  Weight: 153 lb 9.6 oz (69.673 kg)    Physical Exam  Constitutional: She  is oriented to person, place, and time and well-developed, well-nourished, and in no distress.  HENT:  Head: Normocephalic and atraumatic.  Eyes: Conjunctivae and EOM are normal. Pupils are equal, round, and reactive to light. No scleral icterus.  Neck: Normal range of motion. Neck supple. Carotid bruit is not present. No thyromegaly present.  Cardiovascular: Normal rate, regular rhythm and normal heart sounds.  Exam reveals no gallop and no friction rub.   No murmur heard. Pulmonary/Chest: Effort normal and breath sounds normal. No respiratory distress. She has no wheezes. She has no rales.  Lymphadenopathy:    She has no cervical adenopathy.  Neurological: She is alert and oriented to person, place, and time.   Psychiatric:  Affect is much improved.  Anxiety and agitation improved.  Some concentration problem remains.  Vitals reviewed.      No results found for this or any previous visit (from the past 2160 hour(s)).   Assessment & Plan  Problem List Items Addressed This Visit      Other   Depression   Relevant Medications   FLUoxetine (PROZAC) 20 MG tablet   Anxiety and depression - Primary   Relevant Medications   clonazePAM (KLONOPIN) 1 MG tablet   Anxiety disorder   Relevant Medications   FLUoxetine (PROZAC) 20 MG tablet      Meds ordered this encounter  Medications  . FLUoxetine (PROZAC) 20 MG tablet    Sig: Take 1 tablet (20 mg total) by mouth daily.    Dispense:  90 tablet    Refill:  3  . clonazePAM (KLONOPIN) 1 MG tablet    Sig: Take 1/2 tablet twice a day and 1 at  Bedtime.    Dispense:  60 tablet    Refill:  5   1. Anxiety and depression  - clonazePAM (KLONOPIN) 1 MG tablet; Take 1/2 tablet twice a day and 1 at  Bedtime.  Dispense: 60 tablet; Refill: 5  2. Depression  - FLUoxetine (PROZAC) 20 MG tablet; Take 1 tablet (20 mg total) by mouth daily.  Dispense: 90 tablet; Refill: 3  3. Other mixed anxiety disorders  - FLUoxetine (PROZAC) 20 MG tablet; Take 1 tablet (20 mg total) by mouth daily.  Dispense: 90 tablet; Refill: 3

## 2015-11-21 ENCOUNTER — Ambulatory Visit: Payer: Medicare HMO | Admitting: Podiatry

## 2015-12-01 ENCOUNTER — Encounter: Payer: Self-pay | Admitting: Family Medicine

## 2015-12-01 ENCOUNTER — Ambulatory Visit (INDEPENDENT_AMBULATORY_CARE_PROVIDER_SITE_OTHER): Payer: Medicare HMO | Admitting: Family Medicine

## 2015-12-01 VITALS — BP 132/85 | HR 103 | Temp 98.5°F | Resp 16 | Ht 59.0 in | Wt 152.0 lb

## 2015-12-01 DIAGNOSIS — J45901 Unspecified asthma with (acute) exacerbation: Secondary | ICD-10-CM | POA: Diagnosis not present

## 2015-12-01 MED ORDER — ALBUTEROL SULFATE HFA 108 (90 BASE) MCG/ACT IN AERS: 2.0000 | INHALATION_SPRAY | Freq: Four times a day (QID) | RESPIRATORY_TRACT | Status: DC | PRN

## 2015-12-01 MED ORDER — PREDNISONE 10 MG PO TABS: ORAL_TABLET | ORAL | Status: DC

## 2015-12-01 MED ORDER — DM-GUAIFENESIN ER 30-600 MG PO TB12: 1.0000 | ORAL_TABLET | Freq: Two times a day (BID) | ORAL | Status: DC

## 2015-12-01 MED ORDER — AZITHROMYCIN 250 MG PO TABS: ORAL_TABLET | ORAL | Status: DC

## 2015-12-01 NOTE — Progress Notes (Signed)
Name: Mackenzie Ward   MRN: [id number]    DOB: 09-Apr-1946   Date:12/01/2015       Progress Note  Subjective  Chief Complaint  Chief Complaint  Patient presents with  . Cough  . Headache    HPI Here c/o cough for past 3-4 days.  Deep cough.  Dry.  + nasal drainage.  Some sputum production.  No fever.  No aches or chills.  No problem-specific assessment & plan notes found for this encounter.   Past Medical History  Diagnosis Date  . High cholesterol   . Depression     Social History  Substance Use Topics  . Smoking status: Current Every Day Smoker -- 1.00 packs/day for 45 years    Types: Cigarettes  . Smokeless tobacco: Never Used  . Alcohol Use: No     Current outpatient prescriptions:  .  clonazePAM (KLONOPIN) 1 MG tablet, Take 1/2 tablet twice a day and 1 at  Bedtime., Disp: 60 tablet, Rfl: 5 .  FLUoxetine (PROZAC) 20 MG tablet, Take 1 tablet (20 mg total) by mouth daily., Disp: 90 tablet, Rfl: 3 .  fluticasone (FLONASE) 50 MCG/ACT nasal spray, Place 2 sprays into both nostrils daily., Disp: 16 g, Rfl: 6 .  folic acid (FOLVITE) 1 MG tablet, Take by mouth. Reported on 11/08/2015, Disp: , Rfl:  .  loratadine (CLARITIN) 10 MG tablet, Take 1 tablet (10 mg total) by mouth daily., Disp: 30 tablet, Rfl: 11 .  meloxicam (MOBIC) 7.5 MG tablet, 1-2 tabs daily for foot pain, Disp: 30 tablet, Rfl: 6 .  menthol-zinc oxide (GOLD BOND) powder, Reported on 11/08/2015, Disp: , Rfl:   Not on File  Review of Systems  Constitutional: Positive for malaise/fatigue. Negative for fever, chills and weight loss.  HENT: Negative for hearing loss.   Eyes: Negative for blurred vision and double vision.  Respiratory: Positive for cough, shortness of breath and wheezing.   Cardiovascular: Negative for chest pain, palpitations and leg swelling.  Gastrointestinal: Negative for heartburn, abdominal pain and blood in stool.  Genitourinary: Negative for dysuria, urgency and frequency.  Skin:  Negative for rash.  Neurological: Negative for weakness and headaches.      Objective  Filed Vitals:   12/01/15 1454  BP: 132/85  Pulse: 103  Temp: 98.5 F (36.9 C)  TempSrc: Oral  Resp: 16  Height: 4\' 11"  (1.499 m)  Weight: 152 lb (68.947 kg)     Physical Exam  Constitutional: She is oriented to person, place, and time and well-developed, well-nourished, and in no distress. No distress.  HENT:  Head: Normocephalic and atraumatic.  Right Ear: External ear normal.  Left Ear: External ear normal.  Nose: Rhinorrhea (clear) present.  Mouth/Throat: Oropharynx is clear and moist.  Neck: Normal range of motion. Neck supple.  Cardiovascular: Regular rhythm and normal heart sounds.  Tachycardia present.   Pulmonary/Chest: Effort normal. No respiratory distress. She has wheezes (mild exp wheeze). She has no rales.  Coarse rhonchi throughout.  Musculoskeletal: She exhibits no edema.  Lymphadenopathy:    She has no cervical adenopathy.  Neurological: She is alert and oriented to person, place, and time.  Vitals reviewed.     No results found for this or any previous visit (from the past 2160 hour(s)).   Assessment & Plan  .1. Asthmatic bronchitis with acute exacerbation  - azithromycin (ZITHROMAX) 250 MG tablet; Take 2 tabs on day 1, then 1 tab daily on days 2-5  Dispense: 6 tablet; Refill: 0 -  albuterol (PROVENTIL HFA;VENTOLIN HFA) 108 (90 Base) MCG/ACT inhaler; Inhale 2 puffs into the lungs every 6 (six) hours as needed for wheezing or shortness of breath.  Dispense: 1 Inhaler; Refill: 12 - predniSONE (DELTASONE) 10 MG tablet; Take 3 tabs a day for 2 days, 2 tabs a day for 2 days, then 1 tab a day for 2 days. (3, 3, 2,. 2, 1, 1.)  Dispense: 12 tablet; Refill: 0 - dextromethorphan-guaiFENesin (MUCINEX DM) 30-600 MG 12hr tablet; Take 1 tablet by mouth 2 (two) times daily.  Dispense: 20 tablet; Refill: 0

## 2015-12-08 ENCOUNTER — Ambulatory Visit: Payer: Medicare Other | Admitting: Family Medicine

## 2015-12-08 ENCOUNTER — Ambulatory Visit
Admission: RE | Admit: 2015-12-08 | Discharge: 2015-12-08 | Disposition: A | Discharge status: Home / Self Care | Payer: Medicare HMO | Source: Ambulatory Visit | Attending: Family Medicine | Admitting: Family Medicine

## 2015-12-08 ENCOUNTER — Encounter: Payer: Self-pay | Admitting: Family Medicine

## 2015-12-08 ENCOUNTER — Ambulatory Visit (INDEPENDENT_AMBULATORY_CARE_PROVIDER_SITE_OTHER): Payer: Medicare Other | Admitting: Family Medicine

## 2015-12-08 VITALS — BP 134/85 | HR 96 | Temp 99.1°F | Resp 16 | Ht 59.0 in | Wt 146.0 lb

## 2015-12-08 DIAGNOSIS — R0602 Shortness of breath: Secondary | ICD-10-CM | POA: Insufficient documentation

## 2015-12-08 DIAGNOSIS — R059 Cough, unspecified: Secondary | ICD-10-CM

## 2015-12-08 DIAGNOSIS — R05 Cough: Secondary | ICD-10-CM | POA: Insufficient documentation

## 2015-12-08 DIAGNOSIS — J4521 Mild intermittent asthma with (acute) exacerbation: Secondary | ICD-10-CM

## 2015-12-08 MED ORDER — IPRATROPIUM-ALBUTEROL 0.5-2.5 (3) MG/3ML IN SOLN
3.0000 mL | Freq: Once | RESPIRATORY_TRACT | Status: AC
  Administered 2015-12-08: 3 mL via RESPIRATORY_TRACT

## 2015-12-08 MED ORDER — IPRATROPIUM-ALBUTEROL 0.5-2.5 (3) MG/3ML IN SOLN: 3.0000 mL | Freq: Four times a day (QID) | RESPIRATORY_TRACT | Status: DC | PRN

## 2015-12-08 NOTE — Patient Instructions (Signed)
Patient treated with DuoNeb nebulizer.  PO2 post treatment was 94%.

## 2015-12-08 NOTE — Progress Notes (Signed)
Name: Mackenzie Ward   MRN: [id number]    DOB: 1945/12/17   Date:12/08/2015       Progress Note  Subjective  Chief Complaint  Chief Complaint  Patient presents with  . Cough  . Shortness of Breath    HPI Here for f/u of cough and SOB.  She has finished Z-pak and prednisone and is finishing Tamiflu  tomorrow.  Husband with the flu.  She is still SOB and very tired with any activity.  Cough still productive of clear sputum.    No problem-specific assessment & plan notes found for this encounter.   Past Medical History  Diagnosis Date  . High cholesterol   . Depression     Social History  Substance Use Topics  . Smoking status: Current Every Day Smoker -- 1.00 packs/day for 45 years    Types: Cigarettes  . Smokeless tobacco: Never Used  . Alcohol Use: No     Current outpatient prescriptions:  .  albuterol (PROVENTIL HFA;VENTOLIN HFA) 108 (90 Base) MCG/ACT inhaler, Inhale 2 puffs into the lungs every 6 (six) hours as needed for wheezing or shortness of breath., Disp: 1 Inhaler, Rfl: 12 .  clonazePAM (KLONOPIN) 1 MG tablet, Take 1/2 tablet twice a day and 1 at  Bedtime., Disp: 60 tablet, Rfl: 5 .  dextromethorphan-guaiFENesin (MUCINEX DM) 30-600 MG 12hr tablet, Take 1 tablet by mouth 2 (two) times daily., Disp: 20 tablet, Rfl: 0 .  FLUoxetine (PROZAC) 20 MG tablet, Take 1 tablet (20 mg total) by mouth daily., Disp: 90 tablet, Rfl: 3 .  fluticasone (FLONASE) 50 MCG/ACT nasal spray, Place 2 sprays into both nostrils daily., Disp: 16 g, Rfl: 6 .  folic acid (FOLVITE) 1 MG tablet, Take by mouth. Reported on 11/08/2015, Disp: , Rfl:  .  loratadine (CLARITIN) 10 MG tablet, Take 1 tablet (10 mg total) by mouth daily., Disp: 30 tablet, Rfl: 11 .  meloxicam (MOBIC) 7.5 MG tablet, 1-2 tabs daily for foot pain, Disp: 30 tablet, Rfl: 6 .  menthol-zinc oxide (GOLD BOND) powder, Reported on 11/08/2015, Disp: , Rfl:  .  TAMIFLU 75 MG capsule, Take 75 mg by mouth 2 (two) times daily., Disp: ,  Rfl:   Not on File  Review of Systems  Constitutional: Positive for malaise/fatigue. Negative for fever, chills and weight loss.  HENT: Positive for congestion. Negative for hearing loss.   Eyes: Negative for blurred vision and double vision.  Respiratory: Positive for cough, sputum production and shortness of breath. Negative for wheezing.   Cardiovascular: Negative for chest pain, palpitations and leg swelling.  Gastrointestinal: Negative for heartburn, abdominal pain and blood in stool.  Genitourinary: Negative for dysuria, urgency and frequency.  Musculoskeletal: Negative for myalgias.  Neurological: Positive for weakness. Negative for headaches.      Objective  Filed Vitals:   12/08/15 1443  BP: 134/85  Pulse: 96  Temp: 99.1 F (37.3 C)  TempSrc: Oral  Resp: 16  Height: 4\' 11"  (1.499 m)  Weight: 146 lb (66.225 kg)  SpO2: 93%     Physical Exam  Constitutional: She is oriented to person, place, and time. She appears distressed (Appears to feel very weak.).  HENT:  Head: Normocephalic and atraumatic.  Right Ear: External ear normal.  Left Ear: External ear normal.  Nose: Nose normal.  Mouth/Throat: Oropharynx is clear and moist.  Eyes: Conjunctivae and EOM are normal. Pupils are equal, round, and reactive to light. No scleral icterus.  Neck: No thyromegaly present.  Cardiovascular: Normal rate, regular rhythm and normal heart sounds.  Exam reveals no gallop and no friction rub.   No murmur heard. Pulmonary/Chest: No respiratory distress. She has no rales.  Musical rhonchi with wet sounding cough.  Some SOB after coughing and after walking down hallway.  Musculoskeletal: She exhibits no edema.  Lymphadenopathy:    She has no cervical adenopathy.  Neurological: She is alert and oriented to person, place, and time.  Skin: Skin is warm and dry.  Vitals reviewed.     No results found for this or any previous visit (from the past 2160 hour(s)).   Assessment &  Plan  1. Cough  - DG Chest 2 View; Future - DG Chest 2 View-no acute cardiopulmonary disease. - ipratropium-albuterol (DUONEB) 0.5-2.5 (3) MG/3ML nebulizer solution 3 mL; Take 3 mLs by nebulization once.  2. SOB (shortness of breath)  - DG Chest 2 View; Future - DG Chest 2 View - ipratropium-albuterol (DUONEB) 0.5-2.5 (3) MG/3ML nebulizer solution 3 mL; Take 3 mLs by nebulization once.  3. Asthmatic bronchitis, mild intermittent, with acute exacerbation  - ipratropium-albuterol (DUONEB) 0.5-2.5 (3) MG/3ML SOLN; Take 3 mLs by nebulization every 6 (six) hours as needed.  Dispense: 320 mL; Refill: 12

## 2015-12-12 ENCOUNTER — Ambulatory Visit: Payer: Medicare Other | Admitting: Family Medicine

## 2015-12-22 ENCOUNTER — Encounter: Payer: Self-pay | Admitting: Family Medicine

## 2015-12-22 ENCOUNTER — Ambulatory Visit (INDEPENDENT_AMBULATORY_CARE_PROVIDER_SITE_OTHER): Payer: Medicare HMO | Admitting: Family Medicine

## 2015-12-22 VITALS — BP 98/62 | HR 94 | Resp 16 | Ht 59.0 in | Wt 150.0 lb

## 2015-12-22 DIAGNOSIS — F419 Anxiety disorder, unspecified: Principal | ICD-10-CM

## 2015-12-22 DIAGNOSIS — R5382 Chronic fatigue, unspecified: Secondary | ICD-10-CM

## 2015-12-22 DIAGNOSIS — F418 Other specified anxiety disorders: Secondary | ICD-10-CM | POA: Diagnosis not present

## 2015-12-22 DIAGNOSIS — R5383 Other fatigue: Secondary | ICD-10-CM | POA: Insufficient documentation

## 2015-12-22 DIAGNOSIS — F329 Major depressive disorder, single episode, unspecified: Secondary | ICD-10-CM

## 2015-12-22 NOTE — Progress Notes (Signed)
Name: Mackenzie Ward   MRN: [id number]    DOB: 02-Aug-1946   Date:12/22/2015       Progress Note  Subjective  Chief Complaint  Chief Complaint  Patient presents with  . Cough    f/u breathing issues    HPI Here for f/u of cough.  She has finally stopped smoking.  Her cough has resolved.  She does c/o fatigue all the time.  Too tired to do her routine housework a lot of times.  Her depression and agitation  are doing very well.  No problem-specific assessment & plan notes found for this encounter.   Past Medical History  Diagnosis Date  . High cholesterol   . Depression     Past Surgical History  Procedure Laterality Date  . Breast surgery Right 2013  . Colonoscopy  15 years ago    Family History  Problem Relation Age of Onset  . Lymphoma Mother     Non Hodgkin  . Lymphoma Sister     Non Hodgkin    Social History   Social History  . Marital Status: Married    Spouse Name: N/A  . Number of Children: N/A  . Years of Education: N/A   Occupational History  . Not on file.   Social History Main Topics  . Smoking status: Former Smoker -- 1.00 packs/day for 45 years    Types: Cigarettes    Quit date: 11/21/2015  . Smokeless tobacco: Never Used  . Alcohol Use: No  . Drug Use: No  . Sexual Activity: Not on file   Other Topics Concern  . Not on file   Social History Narrative     Current outpatient prescriptions:  .  albuterol (PROVENTIL HFA;VENTOLIN HFA) 108 (90 Base) MCG/ACT inhaler, Inhale 2 puffs into the lungs every 6 (six) hours as needed for wheezing or shortness of breath., Disp: 1 Inhaler, Rfl: 12 .  clonazePAM (KLONOPIN) 1 MG tablet, Take 1/2 tablet twice a day and 1 at  Bedtime., Disp: 60 tablet, Rfl: 5 .  FLUoxetine (PROZAC) 20 MG tablet, Take 1 tablet (20 mg total) by mouth daily., Disp: 90 tablet, Rfl: 3 .  ipratropium-albuterol (DUONEB) 0.5-2.5 (3) MG/3ML SOLN, Take 3 mLs by nebulization every 6 (six) hours as needed., Disp: 320 mL, Rfl: 12 .   loratadine (CLARITIN) 10 MG tablet, Take 1 tablet (10 mg total) by mouth daily., Disp: 30 tablet, Rfl: 11  Not on File   Review of Systems  Constitutional: Positive for malaise/fatigue. Negative for fever, chills and weight loss.  HENT: Negative for hearing loss.   Eyes: Negative for blurred vision and double vision.  Respiratory: Negative for cough, hemoptysis and wheezing.   Cardiovascular: Negative for chest pain, palpitations and leg swelling.  Gastrointestinal: Negative for heartburn, abdominal pain and blood in stool.  Genitourinary: Negative for dysuria, urgency and frequency.  Skin: Negative for rash.  Neurological: Negative for weakness and headaches.      Objective  Filed Vitals:   12/22/15 1420  BP: 98/62  Pulse: 94  Resp: 16  Height: 4\' 11"  (1.499 m)  Weight: 150 lb (68.04 kg)    Physical Exam  Constitutional: She is oriented to person, place, and time and well-developed, well-nourished, and in no distress. No distress.  HENT:  Head: Normocephalic and atraumatic.  Right Ear: External ear normal.  Left Ear: External ear normal.  Nose: Nose normal.  Mouth/Throat: Oropharynx is clear and moist.  Eyes: Conjunctivae and EOM are normal. Pupils  are equal, round, and reactive to light. No scleral icterus.  Neck: Normal range of motion. Neck supple. Carotid bruit is not present. No thyromegaly present.  Cardiovascular: Normal rate, regular rhythm and normal heart sounds.  Exam reveals no gallop and no friction rub.   No murmur heard. Pulmonary/Chest: Effort normal and breath sounds normal. No respiratory distress. She has no wheezes. She has no rales.  Abdominal: Soft. Bowel sounds are normal. She exhibits abdominal bruit. She exhibits no distension and no mass. There is no tenderness.  Musculoskeletal: She exhibits no edema.  Lymphadenopathy:    She has no cervical adenopathy.  Neurological: She is alert and oriented to person, place, and time.  Psychiatric: Mood,  memory, affect and judgment normal.  Vitals reviewed.      No results found for this or any previous visit (from the past 2160 hour(s)).   Assessment & Plan  Problem List Items Addressed This Visit      Other   Anxiety and depression - Primary   Fatigue   Relevant Orders   Comprehensive Metabolic Panel (CMET)   CBC with Differential   TSH   Vitamin D (25 hydroxy)   B12 and Folate Panel      No orders of the defined types were placed in this encounter.   1. Anxiety and depression Cont. Prozac and Klonipin  2. Chronic fatigue  - Comprehensive Metabolic Panel (CMET) - CBC with Differential - TSH - Vitamin D (25 hydroxy) - B12 and Folate Panel

## 2015-12-28 LAB — COMPREHENSIVE METABOLIC PANEL
A/G RATIO: 1.5 (ref 1.2–2.2)
ALT: 16 IU/L (ref 0–32)
AST: 17 IU/L (ref 0–40)
Albumin: 3.8 g/dL (ref 3.6–4.8)
Alkaline Phosphatase: 105 IU/L (ref 39–117)
BUN/Creatinine Ratio: 10 — ABNORMAL LOW (ref 12–28)
BUN: 7 mg/dL — ABNORMAL LOW (ref 8–27)
CALCIUM: 9.5 mg/dL (ref 8.7–10.3)
CHLORIDE: 104 mmol/L (ref 96–106)
CO2: 23 mmol/L (ref 18–29)
Creatinine, Ser: 0.69 mg/dL (ref 0.57–1.00)
GFR calc Af Amer: 103 mL/min/{1.73_m2} (ref >59)
GFR, EST NON AFRICAN AMERICAN: 90 mL/min/{1.73_m2} (ref >59)
GLUCOSE: 92 mg/dL (ref 65–99)
Globulin, Total: 2.6 g/dL (ref 1.5–4.5)
POTASSIUM: 5.5 mmol/L — AB (ref 3.5–5.2)
Sodium: 143 mmol/L (ref 134–144)
Total Protein: 6.4 g/dL (ref 6.0–8.5)

## 2015-12-28 LAB — CBC WITH DIFFERENTIAL/PLATELET
BASOS ABS: 0 10*3/uL (ref 0.0–0.2)
Basos: 0 %
EOS (ABSOLUTE): 0.1 10*3/uL (ref 0.0–0.4)
Eos: 2 %
HEMOGLOBIN: 14 g/dL (ref 11.1–15.9)
Hematocrit: 42.9 % (ref 34.0–46.6)
Immature Grans (Abs): 0 10*3/uL (ref 0.0–0.1)
Immature Granulocytes: 0 %
LYMPHS: 33 %
Lymphocytes Absolute: 2.3 10*3/uL (ref 0.7–3.1)
MCH: 28.7 pg (ref 26.6–33.0)
MCHC: 32.6 g/dL (ref 31.5–35.7)
MCV: 88 fL (ref 79–97)
Monocytes Absolute: 0.6 10*3/uL (ref 0.1–0.9)
Monocytes: 8 %
NEUTROS PCT: 57 %
Neutrophils Absolute: 4 10*3/uL (ref 1.4–7.0)
PLATELETS: 355 10*3/uL (ref 150–379)
RBC: 4.88 x10E6/uL (ref 3.77–5.28)
RDW: 15.2 % (ref 12.3–15.4)
WBC: 7.1 10*3/uL (ref 3.4–10.8)

## 2015-12-28 LAB — TSH: TSH: 2.59 u[IU]/mL (ref 0.450–4.500)

## 2015-12-28 LAB — B12 AND FOLATE PANEL
Folate: 15 ng/mL (ref >3.0)
Vitamin B-12: 439 pg/mL (ref 211–946)

## 2015-12-28 LAB — VITAMIN D 25 HYDROXY (VIT D DEFICIENCY, FRACTURES): VIT D 25 HYDROXY: 11.5 ng/mL — AB (ref 30.0–100.0)

## 2015-12-29 ENCOUNTER — Other Ambulatory Visit: Payer: Self-pay | Admitting: *Deleted

## 2015-12-29 ENCOUNTER — Other Ambulatory Visit: Payer: Self-pay | Admitting: Family Medicine

## 2015-12-29 DIAGNOSIS — E875 Hyperkalemia: Secondary | ICD-10-CM

## 2016-01-06 LAB — BASIC METABOLIC PANEL
BUN / CREAT RATIO: 9 — AB (ref 12–28)
BUN: 6 mg/dL — ABNORMAL LOW (ref 8–27)
CO2: 21 mmol/L (ref 18–29)
CREATININE: 0.65 mg/dL (ref 0.57–1.00)
Calcium: 9.2 mg/dL (ref 8.7–10.3)
Chloride: 99 mmol/L (ref 96–106)
GFR, EST AFRICAN AMERICAN: 105 mL/min/{1.73_m2} (ref >59)
GFR, EST NON AFRICAN AMERICAN: 92 mL/min/{1.73_m2} (ref >59)
GLUCOSE: 84 mg/dL (ref 65–99)
Potassium: 5.3 mmol/L — ABNORMAL HIGH (ref 3.5–5.2)
SODIUM: 137 mmol/L (ref 134–144)

## 2016-02-16 ENCOUNTER — Ambulatory Visit
Admission: RE | Admit: 2016-02-16 | Discharge: 2016-02-16 | Disposition: A | Discharge status: Home / Self Care | Payer: Medicare HMO | Source: Ambulatory Visit | Attending: Family Medicine | Admitting: Family Medicine

## 2016-02-16 ENCOUNTER — Ambulatory Visit (INDEPENDENT_AMBULATORY_CARE_PROVIDER_SITE_OTHER): Payer: Medicare HMO | Admitting: Family Medicine

## 2016-02-16 ENCOUNTER — Encounter: Payer: Self-pay | Admitting: Family Medicine

## 2016-02-16 VITALS — BP 109/70 | HR 79 | Temp 98.0°F | Resp 16 | Ht <= 58 in | Wt 148.0 lb

## 2016-02-16 DIAGNOSIS — J40 Bronchitis, not specified as acute or chronic: Secondary | ICD-10-CM

## 2016-02-16 MED ORDER — DM-GUAIFENESIN ER 30-600 MG PO TB12: 1.0000 | ORAL_TABLET | Freq: Two times a day (BID) | ORAL | Status: DC | PRN

## 2016-02-16 MED ORDER — LEVOFLOXACIN 500 MG PO TABS: 500.0000 mg | ORAL_TABLET | Freq: Every day | ORAL | Status: AC

## 2016-02-16 MED ORDER — FLUTICASONE PROPIONATE HFA 110 MCG/ACT IN AERO: 2.0000 | INHALATION_SPRAY | Freq: Two times a day (BID) | RESPIRATORY_TRACT | Status: DC

## 2016-02-16 NOTE — Progress Notes (Signed)
Name: Mackenzie Ward   MRN: [id number]    DOB: [birth year]   Date:02/16/2016       Progress Note  Subjective  Chief Complaint  Chief Complaint  Patient presents with  . Cough    cough went away then came back several weeks ago.    HPI Here c/o cough for past 2 weeks.  Cough is occ productive.  Treated with Z-pak and Prednisone 2-3 months ago.  She has started smoking again.  She has had no fever or chills.  She c/o R lateral and post rib cage pain.  No problem-specific assessment & plan notes found for this encounter.   Past Medical History  Diagnosis Date  . High cholesterol   . Depression     Social History  Substance Use Topics  . Smoking status: Former Smoker -- 1.00 packs/day for 45 years    Types: Cigarettes    Quit date: 11/21/2015  . Smokeless tobacco: Never Used  . Alcohol Use: No     Current outpatient prescriptions:  .  albuterol (PROVENTIL HFA;VENTOLIN HFA) 108 (90 Base) MCG/ACT inhaler, Inhale 2 puffs into the lungs every 6 (six) hours as needed for wheezing or shortness of breath., Disp: 1 Inhaler, Rfl: 12 .  clonazePAM (KLONOPIN) 1 MG tablet, Take 1/2 tablet twice a day and 1 at  Bedtime., Disp: 60 tablet, Rfl: 5 .  diphenhydrAMINE (SOMINEX) 25 MG tablet, Take 25 mg by mouth at bedtime as needed for sleep., Disp: , Rfl:  .  FLUoxetine (PROZAC) 20 MG tablet, Take 1 tablet (20 mg total) by mouth daily., Disp: 90 tablet, Rfl: 3 .  ipratropium-albuterol (DUONEB) 0.5-2.5 (3) MG/3ML SOLN, Take 3 mLs by nebulization every 6 (six) hours as needed., Disp: 320 mL, Rfl: 12 .  loratadine (CLARITIN) 10 MG tablet, Take 1 tablet (10 mg total) by mouth daily., Disp: 30 tablet, Rfl: 11  Not on File  Review of Systems  Constitutional: Positive for malaise/fatigue. Negative for fever, chills and weight loss.  HENT: Negative for hearing loss.   Eyes: Negative for blurred vision and double vision.  Respiratory: Positive for cough, sputum production, shortness of breath and  wheezing.   Cardiovascular: Positive for chest pain (R lateral and post). Negative for palpitations and leg swelling.  Gastrointestinal: Negative for heartburn, abdominal pain and blood in stool.  Skin: Negative for rash.  Neurological: Negative for weakness and headaches.      Objective  Filed Vitals:   02/16/16 0913  BP: 109/70  Pulse: 79  Temp: 98 F (36.7 C)  TempSrc: Oral  Resp: 16  Height: 4\' 10"  (1.473 m)  Weight: 148 lb (67.132 kg)     Physical Exam  Constitutional: She is oriented to person, place, and time and well-developed, well-nourished, and in no distress. No distress.  HENT:  Head: Normocephalic and atraumatic.  Right Ear: External ear normal.  Left Ear: External ear normal.  Nose: Nose normal.  Mouth/Throat: Oropharynx is clear and moist.  Neck: Normal range of motion. Neck supple. Carotid bruit is not present. No thyromegaly present.  Cardiovascular: Normal rate, regular rhythm and normal heart sounds.   Pulmonary/Chest: Effort normal. No respiratory distress. She has wheezes. She has no rales.  Lymphadenopathy:    She has no cervical adenopathy.  Neurological: She is alert and oriented to person, place, and time.  Vitals reviewed.     Recent Results (from the past 2160 hour(s))  Comprehensive Metabolic Panel (CMET)     Status: Abnormal  Collection Time: 12/27/15  8:13 AM  Result Value Ref Range   Glucose 92 65 - 99 mg/dL   BUN 7 (L) 8 - 27 mg/dL   Creatinine, Ser 0.69 0.57 - 1.00 mg/dL   GFR calc non Af Amer 90 >59 mL/min/1.73   GFR calc Af Amer 103 >59 mL/min/1.73   BUN/Creatinine Ratio 10 (L) 12 - 28   Sodium 143 134 - 144 mmol/L   Potassium 5.5 (H) 3.5 - 5.2 mmol/L   Chloride 104 96 - 106 mmol/L   CO2 23 18 - 29 mmol/L   Calcium 9.5 8.7 - 10.3 mg/dL   Total Protein 6.4 6.0 - 8.5 g/dL   Albumin 3.8 3.6 - 4.8 g/dL   Globulin, Total 2.6 1.5 - 4.5 g/dL   Albumin/Globulin Ratio 1.5 1.2 - 2.2   Bilirubin Total <0.2 0.0 - 1.2 mg/dL    Alkaline Phosphatase 105 39 - 117 IU/L   AST 17 0 - 40 IU/L   ALT 16 0 - 32 IU/L  CBC with Differential     Status: None   Collection Time: 12/27/15  8:13 AM  Result Value Ref Range   WBC 7.1 3.4 - 10.8 x10E3/uL   RBC 4.88 3.77 - 5.28 x10E6/uL   Hemoglobin 14.0 11.1 - 15.9 g/dL   Hematocrit 42.9 34.0 - 46.6 %   MCV 88 79 - 97 fL   MCH 28.7 26.6 - 33.0 pg   MCHC 32.6 31.5 - 35.7 g/dL   RDW 15.2 12.3 - 15.4 %   Platelets 355 150 - 379 x10E3/uL   Neutrophils 57 %   Lymphs 33 %   Monocytes 8 %   Eos 2 %   Basos 0 %   Neutrophils Absolute 4.0 1.4 - 7.0 x10E3/uL   Lymphocytes Absolute 2.3 0.7 - 3.1 x10E3/uL   Monocytes Absolute 0.6 0.1 - 0.9 x10E3/uL   EOS (ABSOLUTE) 0.1 0.0 - 0.4 x10E3/uL   Basophils Absolute 0.0 0.0 - 0.2 x10E3/uL   Immature Granulocytes 0 %   Immature Grans (Abs) 0.0 0.0 - 0.1 x10E3/uL  TSH     Status: None   Collection Time: 12/27/15  8:13 AM  Result Value Ref Range   TSH 2.590 0.450 - 4.500 uIU/mL  Vitamin D (25 hydroxy)     Status: Abnormal   Collection Time: 12/27/15  8:13 AM  Result Value Ref Range   Vit D, 25-Hydroxy 11.5 (L) 30.0 - 100.0 ng/mL    Comment: Vitamin D deficiency has been defined by the Institute of Medicine and an Endocrine Society practice guideline as a level of serum 25-OH vitamin D less than 20 ng/mL (1,2). The Endocrine Society went on to further define vitamin D insufficiency as a level between 21 and 29 ng/mL (2). 1. IOM (Institute of Medicine). 2010. Dietary reference    intakes for calcium and D. [place]: The    [employer]. 2. Holick MF, [place] , Bischoff-Ferrari HA, et al.    Evaluation, treatment, and prevention of vitamin D    deficiency: an Endocrine Society clinical practice    guideline. JCEM. 2011 Jul; 96(7):1911-30.   B12 and Folate Panel     Status: None   Collection Time: 12/27/15  8:13 AM  Result Value Ref Range   Vitamin B-12 439 211 - 946 pg/mL   Folate 15.0 >3.0 ng/mL    Comment: A  serum folate concentration of less than 3.1 ng/mL is considered to represent clinical deficiency.   Basic Metabolic Panel (  BMET)     Status: Abnormal   Collection Time: 01/05/16  8:39 AM  Result Value Ref Range   Glucose 84 65 - 99 mg/dL   BUN 6 (L) 8 - 27 mg/dL   Creatinine, Ser 0.65 0.57 - 1.00 mg/dL   GFR calc non Af Amer 92 >59 mL/min/1.73   GFR calc Af Amer 105 >59 mL/min/1.73   BUN/Creatinine Ratio 9 (L) 12 - 28   Sodium 137 134 - 144 mmol/L   Potassium 5.3 (H) 3.5 - 5.2 mmol/L   Chloride 99 96 - 106 mmol/L   CO2 21 18 - 29 mmol/L   Calcium 9.2 8.7 - 10.3 mg/dL     Assessment & Plan   1. Bronchitis  - DG Chest 2 View; Future - levofloxacin (LEVAQUIN) 500 MG tablet; Take 1 tablet (500 mg total) by mouth daily.  Dispense: 10 tablet; Refill: 0 - fluticasone (FLOVENT HFA) 110 MCG/ACT inhaler; Inhale 2 puffs into the lungs 2 (two) times daily. Rinse mouth with water after use  Dispense: 1 Inhaler; Refill: 12 - dextromethorphan-guaiFENesin (MUCINEX DM) 30-600 MG 12hr tablet; Take 1 tablet by mouth 2 (two) times daily as needed for cough.  Dispense: 30 tablet; Refill: 1

## 2016-02-16 NOTE — Patient Instructions (Signed)
Discussed stopping smoking again

## 2016-03-06 ENCOUNTER — Encounter: Payer: Self-pay | Admitting: Family Medicine

## 2016-03-06 ENCOUNTER — Ambulatory Visit (INDEPENDENT_AMBULATORY_CARE_PROVIDER_SITE_OTHER): Payer: Medicare HMO | Admitting: Family Medicine

## 2016-03-06 VITALS — BP 99/63 | HR 76 | Temp 97.7°F | Resp 16 | Ht <= 58 in | Wt 148.0 lb

## 2016-03-06 DIAGNOSIS — R197 Diarrhea, unspecified: Secondary | ICD-10-CM

## 2016-03-06 DIAGNOSIS — R053 Chronic cough: Secondary | ICD-10-CM

## 2016-03-06 DIAGNOSIS — R05 Cough: Secondary | ICD-10-CM

## 2016-03-06 DIAGNOSIS — F329 Major depressive disorder, single episode, unspecified: Secondary | ICD-10-CM | POA: Diagnosis not present

## 2016-03-06 DIAGNOSIS — F32A Depression, unspecified: Secondary | ICD-10-CM

## 2016-03-06 DIAGNOSIS — F413 Other mixed anxiety disorders: Secondary | ICD-10-CM

## 2016-03-06 NOTE — Progress Notes (Signed)
Name: Mackenzie Ward   MRN: [id number]    DOB: [birth year]   Date:03/06/2016       Progress Note  Subjective  Chief Complaint  Chief Complaint  Patient presents with  . Depression  . Diarrhea    mornings x months    HPI Here for f/u of depression.  She is developing agitation and over activity again.  She feels tired all the time. She feels that she is more forgetful.  She got confused in bedroom several weeks ago. She has developed diarrhea in the AM daily for past several months.  Watery.  None in afternoon  She still has some cough productive of clear sputum.  No problem-specific assessment & plan notes found for this encounter.   Past Medical History  Diagnosis Date  . High cholesterol   . Depression     Past Surgical History  Procedure Laterality Date  . Breast surgery Right 2013  . Colonoscopy  15 years ago    Family History  Problem Relation Age of Onset  . Lymphoma Mother     Non Hodgkin  . Lymphoma Sister     Non Hodgkin    Social History   Social History  . Marital Status: Married    Spouse Name: N/A  . Number of Children: N/A  . Years of Education: N/A   Occupational History  . Not on file.   Social History Main Topics  . Smoking status: Former Smoker -- 1.00 packs/day for 45 years    Types: Cigarettes    Quit date: 11/21/2015  . Smokeless tobacco: Never Used  . Alcohol Use: No  . Drug Use: No  . Sexual Activity: Not on file   Other Topics Concern  . Not on file   Social History Narrative     Current outpatient prescriptions:  .  albuterol (PROVENTIL HFA;VENTOLIN HFA) 108 (90 Base) MCG/ACT inhaler, Inhale 2 puffs into the lungs every 6 (six) hours as needed for wheezing or shortness of breath., Disp: 1 Inhaler, Rfl: 12 .  albuterol (PROVENTIL) (2.5 MG/3ML) 0.083% nebulizer solution, Take 2.5 mg by nebulization every 6 (six) hours as needed for wheezing or shortness of breath., Disp: , Rfl:  .  clonazePAM (KLONOPIN) 1 MG tablet, Take 1/2  tablet twice a day and 1 at  Bedtime., Disp: 60 tablet, Rfl: 5 .  dextromethorphan-guaiFENesin (MUCINEX DM) 30-600 MG 12hr tablet, Take 1 tablet by mouth 2 (two) times daily as needed for cough., Disp: 30 tablet, Rfl: 1 .  FLUoxetine (PROZAC) 20 MG tablet, Take 1 tablet (20 mg total) by mouth daily., Disp: 90 tablet, Rfl: 3 .  ipratropium-albuterol (DUONEB) 0.5-2.5 (3) MG/3ML SOLN, Take 3 mLs by nebulization every 6 (six) hours as needed., Disp: 320 mL, Rfl: 12 .  fluticasone (FLOVENT HFA) 110 MCG/ACT inhaler, Inhale 2 puffs into the lungs 2 (two) times daily. Rinse mouth with water after use (Patient not taking: Reported on 03/06/2016), Disp: 1 Inhaler, Rfl: 12  No Known Allergies   Review of Systems  Constitutional: Positive for malaise/fatigue. Negative for fever, chills and weight loss.  HENT: Negative for hearing loss.   Eyes: Negative for blurred vision and double vision.  Respiratory: Positive for cough. Negative for shortness of breath and wheezing.   Cardiovascular: Negative for palpitations, leg swelling and PND.  Gastrointestinal: Positive for diarrhea. Negative for heartburn, abdominal pain and blood in stool.  Genitourinary: Negative for dysuria, urgency and frequency.  Musculoskeletal: Negative for myalgias and joint pain.  Neurological: Negative for dizziness, tremors, weakness and headaches.  Psychiatric/Behavioral: Positive for depression. The patient is nervous/anxious.       Objective  Filed Vitals:   03/06/16 0924  BP: 99/63  Pulse: 76  Temp: 97.7 F (36.5 C)  TempSrc: Oral  Resp: 16  Height: 4\' 10"  (1.473 m)  Weight: 148 lb (67.132 kg)    Physical Exam  Constitutional: She is oriented to person, place, and time and well-developed, well-nourished, and in no distress. No distress.  HENT:  Head: Normocephalic and atraumatic.  Eyes: Conjunctivae and EOM are normal. Pupils are equal, round, and reactive to light. No scleral icterus.  Neck: Normal range of  motion. Neck supple. Carotid bruit is not present. No thyromegaly present.  Cardiovascular: Normal rate, regular rhythm and normal heart sounds.  Exam reveals no gallop and no friction rub.   No murmur heard. Pulmonary/Chest: Effort normal and breath sounds normal. No respiratory distress. She has no wheezes. She has no rales.  Abdominal: Soft. Bowel sounds are normal. She exhibits no distension and no mass. There is no tenderness.  Musculoskeletal: She exhibits no edema.  Lymphadenopathy:    She has no cervical adenopathy.  Neurological: She is alert and oriented to person, place, and time.  Psychiatric:  Over talkative.  Flight of ideas.    Vitals reviewed.      Recent Results (from the past 2160 hour(s))  Comprehensive Metabolic Panel (CMET)     Status: Abnormal   Collection Time: 12/27/15  8:13 AM  Result Value Ref Range   Glucose 92 65 - 99 mg/dL   BUN 7 (L) 8 - 27 mg/dL   Creatinine, Ser 0.69 0.57 - 1.00 mg/dL   GFR calc non Af Amer 90 >59 mL/min/1.73   GFR calc Af Amer 103 >59 mL/min/1.73   BUN/Creatinine Ratio 10 (L) 12 - 28   Sodium 143 134 - 144 mmol/L   Potassium 5.5 (H) 3.5 - 5.2 mmol/L   Chloride 104 96 - 106 mmol/L   CO2 23 18 - 29 mmol/L   Calcium 9.5 8.7 - 10.3 mg/dL   Total Protein 6.4 6.0 - 8.5 g/dL   Albumin 3.8 3.6 - 4.8 g/dL   Globulin, Total 2.6 1.5 - 4.5 g/dL   Albumin/Globulin Ratio 1.5 1.2 - 2.2   Bilirubin Total <0.2 0.0 - 1.2 mg/dL   Alkaline Phosphatase 105 39 - 117 IU/L   AST 17 0 - 40 IU/L   ALT 16 0 - 32 IU/L  CBC with Differential     Status: None   Collection Time: 12/27/15  8:13 AM  Result Value Ref Range   WBC 7.1 3.4 - 10.8 x10E3/uL   RBC 4.88 3.77 - 5.28 x10E6/uL   Hemoglobin 14.0 11.1 - 15.9 g/dL   Hematocrit 42.9 34.0 - 46.6 %   MCV 88 79 - 97 fL   MCH 28.7 26.6 - 33.0 pg   MCHC 32.6 31.5 - 35.7 g/dL   RDW 15.2 12.3 - 15.4 %   Platelets 355 150 - 379 x10E3/uL   Neutrophils 57 %   Lymphs 33 %   Monocytes 8 %   Eos 2 %    Basos 0 %   Neutrophils Absolute 4.0 1.4 - 7.0 x10E3/uL   Lymphocytes Absolute 2.3 0.7 - 3.1 x10E3/uL   Monocytes Absolute 0.6 0.1 - 0.9 x10E3/uL   EOS (ABSOLUTE) 0.1 0.0 - 0.4 x10E3/uL   Basophils Absolute 0.0 0.0 - 0.2 x10E3/uL   Immature Granulocytes 0 %  Immature Grans (Abs) 0.0 0.0 - 0.1 x10E3/uL  TSH     Status: None   Collection Time: 12/27/15  8:13 AM  Result Value Ref Range   TSH 2.590 0.450 - 4.500 uIU/mL  Vitamin D (25 hydroxy)     Status: Abnormal   Collection Time: 12/27/15  8:13 AM  Result Value Ref Range   Vit D, 25-Hydroxy 11.5 (L) 30.0 - 100.0 ng/mL    Comment: Vitamin D deficiency has been defined by the [place] practice guideline as a level of serum 25-OH vitamin D less than 20 ng/mL (1,2). The Endocrine Society went on to further define vitamin D insufficiency as a level between 21 and 29 ng/mL (2). 1. IOM (Institute of Medicine). 2010. Dietary reference    intakes for calcium and D. [place]: The    [employer]. 2. Holick MF, Binkley [place], Bischoff-Ferrari HA, et al.    Evaluation, treatment, and prevention of vitamin D    deficiency: an Endocrine Society clinical practice    guideline. JCEM. 2011 Jul; 96(7):1911-30.   B12 and Folate Panel     Status: None   Collection Time: 12/27/15  8:13 AM  Result Value Ref Range   Vitamin B-12 439 211 - 946 pg/mL   Folate 15.0 >3.0 ng/mL    Comment: A serum folate concentration of less than 3.1 ng/mL is considered to represent clinical deficiency.   Basic Metabolic Panel (BMET)     Status: Abnormal   Collection Time: 01/05/16  8:39 AM  Result Value Ref Range   Glucose 84 65 - 99 mg/dL   BUN 6 (L) 8 - 27 mg/dL   Creatinine, Ser 0.65 0.57 - 1.00 mg/dL   GFR calc non Af Amer 92 >59 mL/min/1.73   GFR calc Af Amer 105 >59 mL/min/1.73   BUN/Creatinine Ratio 9 (L) 12 - 28   Sodium 137 134 - 144 mmol/L   Potassium 5.3 (H) 3.5 - 5.2 mmol/L   Chloride 99 96 - 106  mmol/L   CO2 21 18 - 29 mmol/L   Calcium 9.2 8.7 - 10.3 mg/dL     Assessment & Plan  Problem List Items Addressed This Visit      Other   Depression - Primary   Relevant Orders   Ambulatory referral to Psychiatry   Anxiety disorder   Chronic cough    Other Visit Diagnoses    Diarrhea, unspecified type        Relevant Orders    Stool Culture    Stool C-Diff Toxin Assay    Stool Giardia/Cryptosporidium       Meds ordered this encounter  Medications  . albuterol (PROVENTIL) (2.5 MG/3ML) 0.083% nebulizer solution    Sig: Take 2.5 mg by nebulization every 6 (six) hours as needed for wheezing or shortness of breath.   1. Depression Cont Prozac and Klonopin for now. - Ambulatory referral to Psychiatry-[facility]  2. Other mixed anxiety disorders   3. Diarrhea, unspecified type Use OTC Imodium once or twice daily as needed. - Stool Culture - Stool C-Diff Toxin Assay - Stool Giardia/Cryptosporidium  4. Chronic cough Use Albuterol prn Patient refuses to use Advair/Symbicort or steroid inhaler because of possibility of thrush.

## 2016-03-08 LAB — C. DIFFICILE GDH AND TOXIN A/B
C. DIFF TOXIN A/B: NOT DETECTED
C. DIFFICILE GDH: NOT DETECTED

## 2016-03-11 LAB — STOOL CULTURE

## 2016-03-12 LAB — GIARDIA/CRYPTOSPORIDIUM (EIA)

## 2016-03-22 ENCOUNTER — Ambulatory Visit (INDEPENDENT_AMBULATORY_CARE_PROVIDER_SITE_OTHER): Payer: Medicare HMO | Admitting: Family Medicine

## 2016-03-22 ENCOUNTER — Encounter: Payer: Self-pay | Admitting: Family Medicine

## 2016-03-22 VITALS — BP 113/70 | HR 83 | Temp 98.0°F | Resp 16 | Ht <= 58 in | Wt 148.0 lb

## 2016-03-22 DIAGNOSIS — F329 Major depressive disorder, single episode, unspecified: Secondary | ICD-10-CM

## 2016-03-22 DIAGNOSIS — F39 Unspecified mood [affective] disorder: Secondary | ICD-10-CM | POA: Diagnosis not present

## 2016-03-22 DIAGNOSIS — F418 Other specified anxiety disorders: Secondary | ICD-10-CM | POA: Diagnosis not present

## 2016-03-22 DIAGNOSIS — F419 Anxiety disorder, unspecified: Secondary | ICD-10-CM

## 2016-03-22 NOTE — Progress Notes (Signed)
Name: Mackenzie Ward   MRN: [id number]    DOB: Feb 06, 1946   Date:03/22/2016       Progress Note  Subjective  Chief Complaint  Chief Complaint  Patient presents with  . Depression    HPI  Here to f/u depression and mood disorder.  She has appt with Psych next week.  Apprehensive somewhat but willing to go if I redommend No problem-specific assessment & plan notes found for this encounter.   Past Medical History  Diagnosis Date  . High cholesterol   . Depression     Past Surgical History  Procedure Laterality Date  . Breast surgery Right 2013  . Colonoscopy  15 years ago    Family History  Problem Relation Age of Onset  . Lymphoma Mother     Non Hodgkin  . Lymphoma Sister     Non Hodgkin    Social History   Social History  . Marital Status: Married    Spouse Name: N/A  . Number of Children: N/A  . Years of Education: N/A   Occupational History  . Not on file.   Social History Main Topics  . Smoking status: Former Smoker -- 1.00 packs/day for 45 years    Types: Cigarettes    Quit date: 11/21/2015  . Smokeless tobacco: Never Used  . Alcohol Use: No  . Drug Use: No  . Sexual Activity: Not on file   Other Topics Concern  . Not on file   Social History Narrative     Current outpatient prescriptions:  .  albuterol (PROVENTIL HFA;VENTOLIN HFA) 108 (90 Base) MCG/ACT inhaler, Inhale 2 puffs into the lungs every 6 (six) hours as needed for wheezing or shortness of breath., Disp: 1 Inhaler, Rfl: 12 .  albuterol (PROVENTIL) (2.5 MG/3ML) 0.083% nebulizer solution, Take 2.5 mg by nebulization every 6 (six) hours as needed for wheezing or shortness of breath., Disp: , Rfl:  .  clonazePAM (KLONOPIN) 1 MG tablet, Take 1/2 tablet twice a day and 1 at  Bedtime., Disp: 60 tablet, Rfl: 5 .  FLUoxetine (PROZAC) 20 MG tablet, Take 1 tablet (20 mg total) by mouth daily., Disp: 90 tablet, Rfl: 3 .  ipratropium-albuterol (DUONEB) 0.5-2.5 (3) MG/3ML SOLN, Take 3 mLs by  nebulization every 6 (six) hours as needed., Disp: 320 mL, Rfl: 12  Not on File   Review of Systems  Constitutional: Negative for fever, chills, weight loss and malaise/fatigue.  HENT: Negative for hearing loss.   Eyes: Negative for blurred vision and double vision.  Respiratory: Negative for cough, shortness of breath and wheezing.   Cardiovascular: Negative for chest pain, palpitations and leg swelling.  Gastrointestinal: Negative for heartburn, abdominal pain and blood in stool.  Genitourinary: Negative for dysuria, urgency and frequency.  Skin: Negative for rash.  Neurological: Negative for weakness and headaches.  Psychiatric/Behavioral: Positive for depression. The patient is nervous/anxious and has insomnia.       Objective  Filed Vitals:   03/22/16 1051  BP: 113/70  Pulse: 83  Temp: 98 F (36.7 C)  TempSrc: Oral  Resp: 16  Height: 4\' 10"  (1.473 m)  Weight: 148 lb (67.132 kg)    Physical Exam  Constitutional: She is oriented to person, place, and time and well-developed, well-nourished, and in no distress.  HENT:  Head: Normocephalic and atraumatic.  Cardiovascular: Normal rate, regular rhythm and normal heart sounds.   Pulmonary/Chest: Effort normal and breath sounds normal.  Neurological: She is alert and oriented to person, place,  and time.  Psychiatric:  She is very talkative.  Some flight of ideas.  Appropriate.  Vitals reviewed.      Recent Results (from the past 2160 hour(s))  Comprehensive Metabolic Panel (CMET)     Status: Abnormal   Collection Time: 12/27/15  8:13 AM  Result Value Ref Range   Glucose 92 65 - 99 mg/dL   BUN 7 (L) 8 - 27 mg/dL   Creatinine, Ser 0.69 0.57 - 1.00 mg/dL   GFR calc non Af Amer 90 >59 mL/min/1.73   GFR calc Af Amer 103 >59 mL/min/1.73   BUN/Creatinine Ratio 10 (L) 12 - 28   Sodium 143 134 - 144 mmol/L   Potassium 5.5 (H) 3.5 - 5.2 mmol/L   Chloride 104 96 - 106 mmol/L   CO2 23 18 - 29 mmol/L   Calcium 9.5 8.7 -  10.3 mg/dL   Total Protein 6.4 6.0 - 8.5 g/dL   Albumin 3.8 3.6 - 4.8 g/dL   Globulin, Total 2.6 1.5 - 4.5 g/dL   Albumin/Globulin Ratio 1.5 1.2 - 2.2   Bilirubin Total <0.2 0.0 - 1.2 mg/dL   Alkaline Phosphatase 105 39 - 117 IU/L   AST 17 0 - 40 IU/L   ALT 16 0 - 32 IU/L  CBC with Differential     Status: None   Collection Time: 12/27/15  8:13 AM  Result Value Ref Range   WBC 7.1 3.4 - 10.8 x10E3/uL   RBC 4.88 3.77 - 5.28 x10E6/uL   Hemoglobin 14.0 11.1 - 15.9 g/dL   Hematocrit 42.9 34.0 - 46.6 %   MCV 88 79 - 97 fL   MCH 28.7 26.6 - 33.0 pg   MCHC 32.6 31.5 - 35.7 g/dL   RDW 15.2 12.3 - 15.4 %   Platelets 355 150 - 379 x10E3/uL   Neutrophils 57 %   Lymphs 33 %   Monocytes 8 %   Eos 2 %   Basos 0 %   Neutrophils Absolute 4.0 1.4 - 7.0 x10E3/uL   Lymphocytes Absolute 2.3 0.7 - 3.1 x10E3/uL   Monocytes Absolute 0.6 0.1 - 0.9 x10E3/uL   EOS (ABSOLUTE) 0.1 0.0 - 0.4 x10E3/uL   Basophils Absolute 0.0 0.0 - 0.2 x10E3/uL   Immature Granulocytes 0 %   Immature Grans (Abs) 0.0 0.0 - 0.1 x10E3/uL  TSH     Status: None   Collection Time: 12/27/15  8:13 AM  Result Value Ref Range   TSH 2.590 0.450 - 4.500 uIU/mL  Vitamin D (25 hydroxy)     Status: Abnormal   Collection Time: 12/27/15  8:13 AM  Result Value Ref Range   Vit D, 25-Hydroxy 11.5 (L) 30.0 - 100.0 ng/mL    Comment: Vitamin D deficiency has been defined by the Institute of Medicine and an Endocrine Society practice guideline as a level of serum 25-OH vitamin D less than 20 ng/mL (1,2). The Endocrine Society went on to further define vitamin D insufficiency as a level between 21 and 29 ng/mL (2). 1. IOM (Institute of Medicine). 2010. Dietary reference    intakes for calcium and D. [place]: The    [employer]. 2. Holick MF, Binkley [place], Bischoff-Ferrari HA, et al.    Evaluation, treatment, and prevention of vitamin D    deficiency: an Endocrine Society clinical practice    guideline. JCEM. 2011 Jul;  96(7):1911-30.   B12 and Folate Panel     Status: None   Collection Time: 12/27/15  8:13 AM  Result Value Ref Range   Vitamin B-12 439 211 - 946 pg/mL   Folate 15.0 >3.0 ng/mL    Comment: A serum folate concentration of less than 3.1 ng/mL is considered to represent clinical deficiency.   Basic Metabolic Panel (BMET)     Status: Abnormal   Collection Time: 01/05/16  8:39 AM  Result Value Ref Range   Glucose 84 65 - 99 mg/dL   BUN 6 (L) 8 - 27 mg/dL   Creatinine, Ser 0.65 0.57 - 1.00 mg/dL   GFR calc non Af Amer 92 >59 mL/min/1.73   GFR calc Af Amer 105 >59 mL/min/1.73   BUN/Creatinine Ratio 9 (L) 12 - 28   Sodium 137 134 - 144 mmol/L   Potassium 5.3 (H) 3.5 - 5.2 mmol/L   Chloride 99 96 - 106 mmol/L   CO2 21 18 - 29 mmol/L   Calcium 9.2 8.7 - 10.3 mg/dL  Stool Culture     Status: None   Collection Time: 03/07/16 10:05 AM  Result Value Ref Range   Organism ID, Bacteria No Salmonella,Shigella,Campylobacter,Yersinia,or    Organism ID, Bacteria No E.coli [id number] isolated.   Giardia/cryptosporidium (EIA)     Status: None   Collection Time: 03/07/16 10:05 AM  Result Value Ref Range   Source: STOOL    Giardia Ag, EIA, Stool       Comment:   GIARDIA AG, EIA, STOOL       MICRO NUMBER:      [id number]   TEST STATUS:       FINAL   SPECIMEN SOURCE:   STOOL   SPECIMEN QUALITY:  ADEQUATE   RESULT 1:          Not Detected                      NOTE: Due to intermittent shedding, one negative                      sample does not necessarily rule out the presence                      of a parasitic infection.    Source: STOOL    Cryptosporidium Ag, DFA       Comment:   CRYPTOSPORIDIUM AG, DFA       MICRO NUMBER:      [id number]   TEST STATUS:       FINAL   SPECIMEN SOURCE:   STOOL   SPECIMEN QUALITY:  ADEQUATE   CRYPTOSPORIDIUM:   Not Detected                      NOTE: Due to intermittent shedding, one negative                      sample does not necessarily rule out the presence                       of a parasitic infection.   C. difficile GDH and Toxin A/B     Status: None   Collection Time: 03/07/16 10:05 AM  Result Value Ref Range   C. difficile GDH Not Detected    C. difficile Toxin A/B Not Detected     Comment: No Toxigenic C. difficile Detected   Source STOOL      Assessment & Plan  Problem List Items Addressed This Visit  Other   Anxiety and depression   Affective disorder ([place]) - Primary      No orders of the defined types were placed in this encounter.   1. Affective disorder ([place])   2. Anxiety and depression Keep Psych appt. Next week.  Cont Prozac and Klonipin for now.

## 2016-03-26 ENCOUNTER — Encounter: Payer: Self-pay | Admitting: Psychiatry

## 2016-03-26 ENCOUNTER — Ambulatory Visit (INDEPENDENT_AMBULATORY_CARE_PROVIDER_SITE_OTHER): Payer: Medicare HMO | Admitting: Psychiatry

## 2016-03-26 VITALS — BP 100/62 | HR 95 | Temp 98.2°F | Ht <= 58 in | Wt 148.8 lb

## 2016-03-26 DIAGNOSIS — F310 Bipolar disorder, current episode hypomanic: Secondary | ICD-10-CM

## 2016-03-26 MED ORDER — QUETIAPINE FUMARATE 50 MG PO TABS: 50.0000 mg | ORAL_TABLET | Freq: Every day | ORAL | Status: DC

## 2016-03-26 MED ORDER — CLONAZEPAM 0.5 MG PO TABS: 0.5000 mg | ORAL_TABLET | Freq: Every day | ORAL | Status: DC
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
## 2016-04-02 ENCOUNTER — Ambulatory Visit: Payer: Medicare Other | Admitting: Family Medicine

## 2016-04-10 ENCOUNTER — Ambulatory Visit (INDEPENDENT_AMBULATORY_CARE_PROVIDER_SITE_OTHER): Payer: Medicare HMO | Admitting: Psychiatry

## 2016-04-10 DIAGNOSIS — F413 Other mixed anxiety disorders: Secondary | ICD-10-CM | POA: Diagnosis not present

## 2016-04-10 DIAGNOSIS — F329 Major depressive disorder, single episode, unspecified: Secondary | ICD-10-CM | POA: Diagnosis not present

## 2016-04-10 DIAGNOSIS — F32A Depression, unspecified: Secondary | ICD-10-CM

## 2016-04-10 DIAGNOSIS — F310 Bipolar disorder, current episode hypomanic: Secondary | ICD-10-CM

## 2016-04-10 MED ORDER — CLONAZEPAM 1 MG PO TABS: 0.5000 mg | ORAL_TABLET | Freq: Every day | ORAL | Status: DC

## 2016-04-10 MED ORDER — QUETIAPINE FUMARATE 50 MG PO TABS: 50.0000 mg | ORAL_TABLET | Freq: Every day | ORAL | 0 refills | Status: DC

## 2016-04-10 MED ORDER — FLUOXETINE HCL 20 MG PO TABS: 20.0000 mg | ORAL_TABLET | Freq: Every day | ORAL | 3 refills | Status: DC
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
## 2016-05-07 ENCOUNTER — Ambulatory Visit (INDEPENDENT_AMBULATORY_CARE_PROVIDER_SITE_OTHER): Payer: Medicare HMO | Admitting: Family Medicine

## 2016-05-07 ENCOUNTER — Encounter: Payer: Self-pay | Admitting: Family Medicine

## 2016-05-07 DIAGNOSIS — F319 Bipolar disorder, unspecified: Secondary | ICD-10-CM | POA: Diagnosis not present

## 2016-05-07 NOTE — Progress Notes (Signed)
Name: Mackenzie Ward   MRN: [id number]    DOB: 19-Oct-1945   Date:05/07/2016       Progress Note  Subjective  Chief Complaint  Chief Complaint  Patient presents with  . Depression    HPI For f/u of Depression.  He is happy with the Psychiatrist.  Dx of bipolar disorder and meds changed.   She is feeling much better overall.  More energy at home and more motivation.  No problem-specific Assessment & Plan notes found for this encounter.   Past Medical History:  Diagnosis Date  . Depression   . High cholesterol     Past Surgical History:  Procedure Laterality Date  . BREAST SURGERY Right 2013  . COLONOSCOPY  15 years ago  . EYE SURGERY    . TONSILLECTOMY      Family History  Problem Relation Age of Onset  . Lymphoma Mother     Non Hodgkin  . Dementia Mother   . Alzheimer's disease Mother   . Lymphoma Sister     Non Hodgkin  . Depression Sister   . Heart attack Father   . Alcohol abuse Father     Social History   Social History  . Marital status: Married    Spouse name: N/A  . Number of children: N/A  . Years of education: N/A   Occupational History  . Not on file.   Social History Main Topics  . Smoking status: Former Smoker    Packs/day: 1.00    Years: 45.00    Types: Cigarettes    Quit date: 11/21/2015  . Smokeless tobacco: Never Used  . Alcohol use No  . Drug use: No  . Sexual activity: Not Currently   Other Topics Concern  . Not on file   Social History Narrative  . No narrative on file     Current Outpatient Prescriptions:  .  albuterol (PROVENTIL HFA;VENTOLIN HFA) 108 (90 Base) MCG/ACT inhaler, Inhale 2 puffs into the lungs every 6 (six) hours as needed for wheezing or shortness of breath., Disp: 1 Inhaler, Rfl: 12 .  albuterol (PROVENTIL) (2.5 MG/3ML) 0.083% nebulizer solution, Take 2.5 mg by nebulization every 6 (six) hours as needed for wheezing or shortness of breath., Disp: , Rfl:  .  Cholecalciferol (VITAMIN D) 2000 units CAPS,  Take 200 Units by mouth daily., Disp: , Rfl:  .  clonazePAM (KLONOPIN) 1 MG tablet, Take 0.5 tablets (0.5 mg total) by mouth at bedtime. Pt has picked up 1mg  pills #60 on 04/05/16, Disp: , Rfl:  .  Cyanocobalamin 2500 MCG CHEW, Chew 1 tablet by mouth daily., Disp: , Rfl:  .  FLUoxetine (PROZAC) 20 MG tablet, Take 1 tablet (20 mg total) by mouth daily., Disp: 90 tablet, Rfl: 3 .  ipratropium-albuterol (DUONEB) 0.5-2.5 (3) MG/3ML SOLN, Take 3 mLs by nebulization every 6 (six) hours as needed., Disp: 320 mL, Rfl: 12 .  QUEtiapine (SEROQUEL) 50 MG tablet, Take 1 tablet (50 mg total) by mouth at bedtime., Disp: 30 tablet, Rfl: 0 .  vitamin E 400 UNIT capsule, Take 400 Units by mouth daily., Disp: , Rfl:   Not on File   Review of Systems  Constitutional: Negative for chills, fever, malaise/fatigue and weight loss.  HENT: Negative for hearing loss.   Eyes: Negative for blurred vision and double vision.  Respiratory: Negative for cough, shortness of breath and wheezing.   Cardiovascular: Negative for chest pain and palpitations.  Gastrointestinal: Negative for abdominal pain, blood in stool  and heartburn.  Genitourinary: Negative for dysuria, frequency and urgency.  Skin: Negative for rash.  Neurological: Negative for dizziness, tremors, weakness and headaches.  Psychiatric/Behavioral: Negative for depression. The patient is not nervous/anxious and does not have insomnia.       Objective  Vitals:   05/07/16 1104  BP: 132/81  Pulse: 69  Resp: 16  Temp: 97.7 F (36.5 C)  TempSrc: Oral  Weight: 145 lb (65.8 kg)  Height: 5' (1.524 m)    Physical Exam  Constitutional: She is oriented to person, place, and time and well-developed, well-nourished, and in no distress. No distress.  HENT:  Head: Normocephalic and atraumatic.  Neck: Normal range of motion. Neck supple. Carotid bruit is not present. No thyromegaly present.  Cardiovascular: Normal rate, regular rhythm and normal heart sounds.   Exam reveals no gallop and no friction rub.   No murmur heard. Pulmonary/Chest: Effort normal and breath sounds normal. No respiratory distress. She has no wheezes. She has no rales.  Abdominal: Soft. Bowel sounds are normal. She exhibits no distension and no mass. There is no tenderness.  Musculoskeletal: She exhibits no edema.  Lymphadenopathy:    She has no cervical adenopathy.  Neurological: She is alert and oriented to person, place, and time.  Vitals reviewed.      Recent Results (from the past 2160 hour(s))  Stool Culture     Status: None   Collection Time: 03/07/16 10:05 AM  Result Value Ref Range   Organism ID, Bacteria No Salmonella,Shigella,Campylobacter,Yersinia,or    Organism ID, Bacteria No E.coli [id number] isolated.   Giardia/cryptosporidium (EIA)     Status: None   Collection Time: 03/07/16 10:05 AM  Result Value Ref Range   Source: STOOL    Giardia Ag, EIA, Stool       Comment:   GIARDIA AG, EIA, STOOL       MICRO NUMBER:      [id number]   TEST STATUS:       FINAL   SPECIMEN SOURCE:   STOOL   SPECIMEN QUALITY:  ADEQUATE   RESULT 1:          Not Detected                      NOTE: Due to intermittent shedding, one negative                      sample does not necessarily rule out the presence                      of a parasitic infection.    Source: STOOL    Cryptosporidium Ag, DFA       Comment:   CRYPTOSPORIDIUM AG, DFA       MICRO NUMBER:      [id number]   TEST STATUS:       FINAL   SPECIMEN SOURCE:   STOOL   SPECIMEN QUALITY:  ADEQUATE   CRYPTOSPORIDIUM:   Not Detected                      NOTE: Due to intermittent shedding, one negative                      sample does not necessarily rule out the presence                      of a parasitic  infection.   C. difficile GDH and Toxin A/B     Status: None   Collection Time: 03/07/16 10:05 AM  Result Value Ref Range   C. difficile GDH Not Detected    C. difficile Toxin A/B Not Detected     Comment: No  Toxigenic C. difficile Detected   Source STOOL      Assessment & Plan  Problem List Items Addressed This Visit      Other   Bipolar 1 disorder ([place])    Other Visit Diagnoses   None.     Meds ordered this encounter  Medications  . vitamin E 400 UNIT capsule    Sig: Take 400 Units by mouth daily.  . Cholecalciferol (VITAMIN D) 2000 units CAPS    Sig: Take 200 Units by mouth daily.  . Cyanocobalamin 2500 MCG CHEW    Sig: Chew 1 tablet by mouth daily.   1. Bipolar 1 disorder ([place]) Cont meds from Psych and cont visits.

## 2016-05-09 ENCOUNTER — Encounter: Payer: Self-pay | Admitting: Psychiatry

## 2016-05-09 ENCOUNTER — Ambulatory Visit (INDEPENDENT_AMBULATORY_CARE_PROVIDER_SITE_OTHER): Payer: Medicare HMO | Admitting: Psychiatry

## 2016-05-09 VITALS — BP 125/79 | HR 97 | Temp 97.9°F | Ht 60.0 in | Wt 148.0 lb

## 2016-05-09 DIAGNOSIS — F310 Bipolar disorder, current episode hypomanic: Secondary | ICD-10-CM | POA: Diagnosis not present

## 2016-05-09 MED ORDER — CLONAZEPAM 0.5 MG PO TABS: 0.5000 mg | ORAL_TABLET | Freq: Every day | ORAL | 1 refills | Status: DC

## 2016-05-09 MED ORDER — QUETIAPINE FUMARATE 50 MG PO TABS: 50.0000 mg | ORAL_TABLET | Freq: Every day | ORAL | 1 refills | Status: DC
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
## 2016-07-09 ENCOUNTER — Encounter: Payer: Self-pay | Admitting: Psychiatry

## 2016-07-09 ENCOUNTER — Ambulatory Visit (INDEPENDENT_AMBULATORY_CARE_PROVIDER_SITE_OTHER): Payer: Medicare HMO | Admitting: Psychiatry

## 2016-07-09 VITALS — BP 112/74 | HR 109 | Temp 97.5°F | Wt 145.4 lb

## 2016-07-09 DIAGNOSIS — F413 Other mixed anxiety disorders: Secondary | ICD-10-CM

## 2016-07-09 DIAGNOSIS — F310 Bipolar disorder, current episode hypomanic: Secondary | ICD-10-CM | POA: Diagnosis not present

## 2016-07-09 MED ORDER — FLUOXETINE HCL 20 MG PO TABS: 20.0000 mg | ORAL_TABLET | Freq: Every day | ORAL | 3 refills | Status: DC

## 2016-07-09 MED ORDER — QUETIAPINE FUMARATE 50 MG PO TABS: 50.0000 mg | ORAL_TABLET | Freq: Every day | ORAL | 1 refills | Status: DC

## 2016-07-09 MED ORDER — CLONAZEPAM 0.5 MG PO TABS: 0.5000 mg | ORAL_TABLET | Freq: Every day | ORAL | 1 refills | Status: DC
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
## 2016-07-30 ENCOUNTER — Ambulatory Visit (INDEPENDENT_AMBULATORY_CARE_PROVIDER_SITE_OTHER): Payer: Medicare HMO | Admitting: Family Medicine

## 2016-07-30 ENCOUNTER — Encounter: Payer: Self-pay | Admitting: Family Medicine

## 2016-07-30 VITALS — BP 123/77 | HR 82 | Temp 97.7°F | Resp 16 | Ht <= 58 in | Wt 145.0 lb

## 2016-07-30 DIAGNOSIS — L821 Other seborrheic keratosis: Secondary | ICD-10-CM | POA: Diagnosis not present

## 2016-07-30 DIAGNOSIS — J320 Chronic maxillary sinusitis: Secondary | ICD-10-CM | POA: Diagnosis not present

## 2016-07-30 MED ORDER — FLUTICASONE PROPIONATE 50 MCG/ACT NA SUSP: 2.0000 | Freq: Every day | NASAL | 3 refills | Status: DC

## 2016-07-30 MED ORDER — AMOXICILLIN-POT CLAVULANATE 875-125 MG PO TABS: 1.0000 | ORAL_TABLET | Freq: Two times a day (BID) | ORAL | 0 refills | Status: DC

## 2016-07-30 NOTE — Assessment & Plan Note (Signed)
Benign appearing SK on R lateral scalp Follow-up as needed

## 2016-07-30 NOTE — Patient Instructions (Signed)
Thank you for coming in to clinic today.  It sounds like you have a Sinusitis (Bacterial Infection) - this most likely started as an Upper Respiratory Virus that has settled into an infection. Allergies can also cause this. - Start Augmentin 1 pill twice daily (breakfast and dinner, with food and plenty of water) for 10 days, complete entire course, do not stop early even if feeling better - Start taking Flonase nasal spray 2 spray in each nostril each day for next 4 weeks, may need for longer - May also try to flush nose with Nasal Saline spray multiple times a day to help flush out congestion and clear sinuses - Improve hydration by drinking plenty of clear fluids (water, gatorade) to reduce secretions and thin congestion - Start Claritin or Zyrtec (once daily) over the counter for about 1 month trial - if helping can continue or resume during allergy season - Congestion draining down throat can cause irritation. May try warm herbal tea with honey, cough drops - Stop mucinex  Mole on head is a Seborrheic Keratosis, which is a benign skin growth.  Please schedule a follow-up appointment with Dr. Parks Ranger in 2 weeks if not improving sinusitis  If you have any other questions or concerns, please feel free to call the clinic or send a message through [facility]. You may also schedule an earlier appointment if necessary.  Nobie Putnam, DO [place]

## 2016-07-30 NOTE — Progress Notes (Signed)
Subjective:    Patient ID: Mackenzie Ward, female    DOB: 04-02-1946, 70 y.o.   MRN: [id number]  Mackenzie Ward is a 70 y.o. female presenting on 07/30/2016 for URI (onset 09/17 ear pain and HA not coughing too much mostly in morning has only 15% energy SOB as per pt )  Patient presents for a same day appointment.  HPI   SINUSITIS: - Reports intermittent worsening over past 3 months, husband is sick contact with same symptoms over this time period, thinks they have shared same infection a few times. Describes initial symptoms are URI congestion, cough, cold symptoms, improvement then return of symptoms off and on. Recently over past few weeks she has had worsening sinus pain and pressure with ear fullness and pressure as well, associated headaches, sinus congestion and pressure is worse in morning and at night. Still has occasional cough but non productive and it is improved. - Tried Mucinex-D for several weeks without relief, various other OTC meds - Has history of allergies, but no longer taking flonase and anti-histamine - She has resumed smoking as well - Admits concern with low energy worse in morning and fatigue with this persistent illness, some dyspnea with activity at times but improves with rest - Denies any fevers/chills, nausea, vomiting, chest pain, dyspnea at rest, back pain, ear pain, dizziness, lightheadedness, pre-syncope   Seborrheic Keratosis, Scalp - Family member noticed a mole on her scalp when cutting her hair recently, wanted to get it checked out. Unsure how long it has been there, described a large brown flat spot. - No prior history of skin cancer - Denies redness, bleeding, swelling, pain  Social History  Substance Use Topics  . Smoking status: Former Smoker    Packs/day: 1.00    Years: 45.00    Types: Cigarettes    Quit date: 11/21/2015  . Smokeless tobacco: Never Used  . Alcohol use No    Review of Systems Per HPI unless specifically indicated  above     Objective:    BP 123/77   Pulse 82   Temp 97.7 F (36.5 C) (Oral)   Resp 16   Ht 4\' 10"  (1.473 m)   Wt 145 lb (65.8 kg)   SpO2 99%   BMI 30.31 kg/m   Wt Readings from Last 3 Encounters:  07/30/16 145 lb (65.8 kg)  05/07/16 145 lb (65.8 kg)  03/22/16 148 lb (67.1 kg)    Physical Exam  Constitutional: She appears well-developed and well-nourished. No distress.  Well-appearing, comfortable, cooperative  HENT:  Head: Normocephalic and atraumatic.  Mouth/Throat: Oropharynx is clear and moist.  Mild maxillary sinuses tender. Nares with some deeper congestion but no purulence or edema. Bilateral TMs clear without erythema, effusion or bulging. Oropharynx clear without erythema, exudates, edema or asymmetry.  Eyes: Conjunctivae are normal.  Neck: Normal range of motion. Neck supple. No thyromegaly present.  Cardiovascular: Normal rate, regular rhythm, normal heart sounds and intact distal pulses.   No murmur heard. Pulmonary/Chest: Effort normal and breath sounds normal. No respiratory distress. She has no wheezes. She has no rales.  Musculoskeletal: She exhibits no edema.  Lymphadenopathy:    She has no cervical adenopathy.  Neurological: She is alert.  Skin: Skin is warm and dry. No rash noted. She is not diaphoretic.  Scalp Right mid lateral scalp superior to ear with 2 x 2 flat light brown raised rough stuck on appearance skin lesion most consistent with seborrheic keratosis. Non tender, no surrounding  skin changes  Nursing note and vitals reviewed.         Assessment & Plan:   Problem List Items Addressed This Visit    Seborrheic keratoses    Benign appearing SK on R lateral scalp Follow-up as needed       Other Visit Diagnoses    Chronic maxillary sinusitis    -  Primary  Consistent with chronic rhinosinusitis, likely initially viral URI with allergic etiology with improvement now with persistent worsening suspect re-infection second sickening with  possible bacterial infection with likely allergic rhinitis component  Plan: 1. Start Augmentin 875-125mg  PO BID x 10 days 2. Start Claritin 10mg  OTC trial x 1 month, may continue if improved 3. Resume Flonase 2 sprays BID x 1 month, may continue if improved then PRN 4. Supportive care with nasal saline OTC, hydration, warm compresses as needed 5. Return criteria reviewed    Relevant Medications   amoxicillin-clavulanate (AUGMENTIN) 875-125 MG tablet   fluticasone (FLONASE) 50 MCG/ACT nasal spray      Meds ordered this encounter  Medications  . amoxicillin-clavulanate (AUGMENTIN) 875-125 MG tablet    Sig: Take 1 tablet by mouth 2 (two) times daily.    Dispense:  20 tablet    Refill:  0  . fluticasone (FLONASE) 50 MCG/ACT nasal spray    Sig: Place 2 sprays into both nostrils daily. For 1 month then as needed    Dispense:  16 g    Refill:  3      Follow up plan: Return in about 2 weeks (around 08/13/2016), or if symptoms worsen or fail to improve, for 2 weeks if not improving sinusitis.  Nobie Putnam, [facility] 07/30/2016, 12:59 PM

## 2016-10-09 ENCOUNTER — Ambulatory Visit: Payer: Medicare HMO | Admitting: Psychiatry

## 2016-10-16 ENCOUNTER — Encounter: Payer: Self-pay | Admitting: Psychiatry

## 2016-10-16 ENCOUNTER — Ambulatory Visit (INDEPENDENT_AMBULATORY_CARE_PROVIDER_SITE_OTHER): Payer: 59 | Admitting: Psychiatry

## 2016-10-16 VITALS — BP 123/73 | HR 89 | Temp 97.7°F | Wt 144.6 lb

## 2016-10-16 DIAGNOSIS — F310 Bipolar disorder, current episode hypomanic: Secondary | ICD-10-CM | POA: Diagnosis not present

## 2016-10-16 DIAGNOSIS — F413 Other mixed anxiety disorders: Secondary | ICD-10-CM | POA: Diagnosis not present

## 2016-10-16 MED ORDER — FLUOXETINE HCL 20 MG PO TABS: 20.0000 mg | ORAL_TABLET | Freq: Every day | ORAL | 3 refills | Status: DC

## 2016-10-16 MED ORDER — BUSPIRONE HCL 5 MG PO TABS: 5.0000 mg | ORAL_TABLET | Freq: Every evening | ORAL | 2 refills | Status: DC

## 2016-10-16 MED ORDER — CLONAZEPAM 0.5 MG PO TABS: 0.2500 mg | ORAL_TABLET | Freq: Every day | ORAL | 2 refills | Status: DC

## 2016-10-16 MED ORDER — QUETIAPINE FUMARATE 50 MG PO TABS: 50.0000 mg | ORAL_TABLET | Freq: Every day | ORAL | 1 refills | Status: DC
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
## 2016-11-05 ENCOUNTER — Encounter: Payer: Self-pay | Admitting: Family Medicine

## 2016-11-05 ENCOUNTER — Encounter: Payer: Self-pay | Admitting: *Deleted

## 2016-11-05 ENCOUNTER — Other Ambulatory Visit: Payer: Self-pay | Admitting: *Deleted

## 2016-11-05 ENCOUNTER — Ambulatory Visit (INDEPENDENT_AMBULATORY_CARE_PROVIDER_SITE_OTHER): Payer: Medicare Other | Admitting: Family Medicine

## 2016-11-05 VITALS — BP 134/85 | HR 83 | Temp 98.1°F | Resp 16 | Ht 60.0 in | Wt 143.0 lb

## 2016-11-05 DIAGNOSIS — M10032 Idiopathic gout, left wrist: Secondary | ICD-10-CM

## 2016-11-05 DIAGNOSIS — F319 Bipolar disorder, unspecified: Secondary | ICD-10-CM | POA: Diagnosis not present

## 2016-11-05 DIAGNOSIS — M109 Gout, unspecified: Secondary | ICD-10-CM | POA: Insufficient documentation

## 2016-11-05 DIAGNOSIS — E7800 Pure hypercholesterolemia, unspecified: Secondary | ICD-10-CM | POA: Diagnosis not present

## 2016-11-05 MED ORDER — TRAMADOL HCL 50 MG PO TABS: 50.0000 mg | ORAL_TABLET | Freq: Four times a day (QID) | ORAL | 0 refills | Status: DC | PRN

## 2016-11-05 MED ORDER — INDOMETHACIN 25 MG PO CAPS: 25.0000 mg | ORAL_CAPSULE | Freq: Three times a day (TID) | ORAL | 6 refills | Status: DC

## 2016-11-05 NOTE — Progress Notes (Signed)
Name: Mackenzie Ward   MRN: [id number]    DOB: Jan 22, 1946   Date:11/05/2016       Progress Note  Subjective  Chief Complaint  Chief Complaint  Patient presents with  . Wrist Pain    LT    HPI Here for f/u of COPD and bipolar disorder.  Seeing Psych re bipolar and taking meds from her.  Breathing is doing well right now.  Acutely c/o L wrist pain that started suddenly Sat night.  She had been doing some extra cleaning during the 2 days before, but no acute injury.  Sharp pain now.  Pain with ROM of R wrist.  No trauma.    She also states that L "lazy eye" is getting worse and L eyelid drooping and L pupil seems constricted.  Also has lesion on L scalp.  She feels that her energy level is not as good it should be.  No problem-specific Assessment & Plan notes found for this encounter.   Past Medical History:  Diagnosis Date  . Depression   . High cholesterol     Past Surgical History:  Procedure Laterality Date  . BREAST SURGERY Right 2013  . COLONOSCOPY  15 years ago  . EYE SURGERY    . TONSILLECTOMY      Family History  Problem Relation Age of Onset  . Lymphoma Mother     Non Hodgkin  . Dementia Mother   . Alzheimer's disease Mother   . Lymphoma Sister     Non Hodgkin  . Depression Sister   . Heart attack Father   . Alcohol abuse Father     Social History   Social History  . Marital status: Married    Spouse name: N/A  . Number of children: N/A  . Years of education: N/A   Occupational History  . Not on file.   Social History Main Topics  . Smoking status: Former Smoker    Packs/day: 1.00    Years: 45.00    Types: Cigarettes    Quit date: 11/21/2015  . Smokeless tobacco: Never Used  . Alcohol use No  . Drug use: No  . Sexual activity: Not Currently   Other Topics Concern  . Not on file   Social History Narrative  . No narrative on file     Current Outpatient Prescriptions:  .  albuterol (PROVENTIL) (2.5 MG/3ML) 0.083% nebulizer  solution, Take 2.5 mg by nebulization every 6 (six) hours as needed for wheezing or shortness of breath., Disp: , Rfl:  .  busPIRone (BUSPAR) 5 MG tablet, Take 1 tablet (5 mg total) by mouth every evening., Disp: 30 tablet, Rfl: 2 .  Cholecalciferol (VITAMIN D) 2000 units CAPS, Take 200 Units by mouth daily., Disp: , Rfl:  .  clonazePAM (KLONOPIN) 0.5 MG tablet, Take 0.5 tablets (0.25 mg total) by mouth at bedtime., Disp: 15 tablet, Rfl: 2 .  Cyanocobalamin 2500 MCG CHEW, Chew 1 tablet by mouth daily., Disp: , Rfl:  .  FLUoxetine (PROZAC) 20 MG tablet, Take 1 tablet (20 mg total) by mouth daily., Disp: 90 tablet, Rfl: 3 .  fluticasone (FLONASE) 50 MCG/ACT nasal spray, Place 2 sprays into both nostrils daily. For 1 month then as needed, Disp: 16 g, Rfl: 3 .  ipratropium-albuterol (DUONEB) 0.5-2.5 (3) MG/3ML SOLN, Take 3 mLs by nebulization every 6 (six) hours as needed., Disp: 320 mL, Rfl: 12 .  QUEtiapine (SEROQUEL) 50 MG tablet, Take 1 tablet (50 mg total) by mouth at  bedtime., Disp: 90 tablet, Rfl: 1 .  vitamin E 400 UNIT capsule, Take 400 Units by mouth daily., Disp: , Rfl:  .  indomethacin (INDOCIN) 25 MG capsule, Take 1 capsule (25 mg total) by mouth 3 (three) times daily with meals., Disp: 30 capsule, Rfl: 6 .  traMADol (ULTRAM) 50 MG tablet, Take 1 tablet (50 mg total) by mouth every 6 (six) hours as needed for moderate pain., Disp: 20 tablet, Rfl: 0  No Known Allergies   Review of Systems  Constitutional: Negative for chills, fever, malaise/fatigue and weight loss.  HENT: Negative for hearing loss and tinnitus.   Eyes: Negative for blurred vision and double vision.       L eyelid drooping.  L pupil constricted.  Respiratory: Negative for cough, shortness of breath and wheezing.   Cardiovascular: Negative for chest pain, palpitations and leg swelling.  Gastrointestinal: Negative for abdominal pain, blood in stool and heartburn.  Genitourinary: Negative for dysuria, frequency and  urgency.  Musculoskeletal: Positive for joint pain (L wrist).  Skin: Negative for rash.  Neurological: Negative for dizziness, tingling, tremors, weakness and headaches.  Psychiatric/Behavioral: Negative for depression (stable).      Objective  Vitals:   11/05/16 1448  BP: 134/85  Pulse: 83  Resp: 16  Temp: 98.1 F (36.7 C)  TempSrc: Oral  Weight: 143 lb (64.9 kg)  Height: 5' (1.524 m)    Physical Exam  Constitutional: She is oriented to person, place, and time. She appears distressed (Appears in pain from L wrist).  HENT:  Head: Normocephalic and atraumatic.  Eyes: Conjunctivae and EOM are normal. Pupils are equal, round, and reactive to light.  L eye is non central.  Vision in L eye in minimal at best.  L eyelid with mild ptosis  Neck: Normal range of motion. Carotid bruit is present. No thyromegaly present.  Cardiovascular: Normal rate, regular rhythm and normal heart sounds.  Exam reveals no gallop and no friction rub.   No murmur heard. Pulmonary/Chest: Effort normal and breath sounds normal. No respiratory distress. She has no wheezes. She has no rales.  Musculoskeletal: She exhibits no edema.  L wrist red, swollen warm and exquisitely tender to touch and ROM.  Lymphadenopathy:    She has no cervical adenopathy.  Neurological: She is alert and oriented to person, place, and time.  Vitals reviewed.      No results found for this or any previous visit (from the past 2160 hour(s)).   Assessment & Plan  Problem List Items Addressed This Visit      Other   Pure hypercholesterolemia - Primary   Relevant Orders   COMPLETE METABOLIC PANEL WITH GFR   Lipid Profile   Bipolar 1 disorder (HCC)   Gout attack   Relevant Medications   indomethacin (INDOCIN) 25 MG capsule   traMADol (ULTRAM) 50 MG tablet   Other Relevant Orders   Uric acid   COMPLETE METABOLIC PANEL WITH GFR   CBC with Differential      Meds ordered this encounter  Medications  .  indomethacin (INDOCIN) 25 MG capsule    Sig: Take 1 capsule (25 mg total) by mouth 3 (three) times daily with meals.    Dispense:  30 capsule    Refill:  6  . traMADol (ULTRAM) 50 MG tablet    Sig: Take 1 tablet (50 mg total) by mouth every 6 (six) hours as needed for moderate pain.    Dispense:  20 tablet  Refill:  0   1. Acute idiopathic gout of left wrist  - Uric acid - COMPLETE METABOLIC PANEL WITH GFR - CBC with Differential - indomethacin (INDOCIN) 25 MG capsule; Take 1 capsule (25 mg total) by mouth 3 (three) times daily with meals.  Dispense: 30 capsule; Refill: 6 - traMADol (ULTRAM) 50 MG tablet; Take 1 tablet (50 mg total) by mouth every 6 (six) hours as needed for moderate pain.  Dispense: 20 tablet; Refill: 0  2. Pure hypercholesterolemia  - COMPLETE METABOLIC PANEL WITH GFR - Lipid Profile  3. Bipolar 1 disorder ([place]) Cont meds from Psych

## 2016-11-06 ENCOUNTER — Other Ambulatory Visit: Payer: Self-pay

## 2016-11-06 ENCOUNTER — Ambulatory Visit: Payer: Self-pay | Admitting: Family Medicine

## 2016-11-06 LAB — COMPLETE METABOLIC PANEL WITH GFR
ALT: 15 U/L (ref 6–29)
AST: 17 U/L (ref 10–35)
Albumin: 3.7 g/dL (ref 3.6–5.1)
Alkaline Phosphatase: 108 U/L (ref 33–130)
BUN: 6 mg/dL — ABNORMAL LOW (ref 7–25)
CO2: 26 mmol/L (ref 20–31)
CREATININE: 0.65 mg/dL (ref 0.50–0.99)
Calcium: 9.1 mg/dL (ref 8.6–10.4)
Chloride: 105 mmol/L (ref 98–110)
GFR, Est Non African American: >89 mL/min (ref >=60)
Glucose, Bld: 75 mg/dL (ref 65–99)
Potassium: 4.5 mmol/L (ref 3.5–5.3)
SODIUM: 137 mmol/L (ref 135–146)
Total Bilirubin: 0.3 mg/dL (ref 0.2–1.2)
Total Protein: 6.5 g/dL (ref 6.1–8.1)

## 2016-11-06 LAB — CBC WITH DIFFERENTIAL/PLATELET
BASOS PCT: 0 %
Basophils Absolute: 0 cells/uL (ref 0–200)
Eosinophils Absolute: 104 cells/uL (ref 15–500)
Eosinophils Relative: 1 %
HEMATOCRIT: 43.5 % (ref 35.0–45.0)
Hemoglobin: 14.6 g/dL (ref 11.7–15.5)
LYMPHS PCT: 31 %
Lymphs Abs: 3224 cells/uL (ref 850–3900)
MCH: 29.6 pg (ref 27.0–33.0)
MCHC: 33.6 g/dL (ref 32.0–36.0)
MCV: 88.2 fL (ref 80.0–100.0)
MPV: 10.4 fL (ref 7.5–12.5)
Monocytes Absolute: 1144 cells/uL — ABNORMAL HIGH (ref 200–950)
Monocytes Relative: 11 %
Neutro Abs: 5928 cells/uL (ref 1500–7800)
Neutrophils Relative %: 57 %
Platelets: 354 10*3/uL (ref 140–400)
RBC: 4.93 MIL/uL (ref 3.80–5.10)
RDW: 14.3 % (ref 11.0–15.0)
WBC: 10.4 10*3/uL (ref 3.8–10.8)

## 2016-11-06 LAB — LIPID PANEL
CHOL/HDL RATIO: 4.6 ratio (ref <5.0)
Cholesterol: 230 mg/dL — ABNORMAL HIGH (ref <200)
HDL: 50 mg/dL — AB (ref >50)
LDL CALC: 153 mg/dL — AB (ref <100)
Triglycerides: 133 mg/dL (ref <150)
VLDL: 27 mg/dL (ref <30)

## 2016-11-07 LAB — URIC ACID: Uric Acid, Serum: 4.6 mg/dL (ref 2.5–7.0)

## 2016-11-13 ENCOUNTER — Other Ambulatory Visit: Payer: Self-pay | Admitting: Family Medicine

## 2016-11-13 MED ORDER — ETODOLAC 500 MG PO TABS: 500.0000 mg | ORAL_TABLET | Freq: Two times a day (BID) | ORAL | 6 refills | Status: DC

## 2017-01-10 ENCOUNTER — Other Ambulatory Visit: Payer: Self-pay

## 2017-01-10 MED ORDER — LORATADINE 10 MG PO TABS: 10.0000 mg | ORAL_TABLET | Freq: Every day | ORAL | 11 refills | Status: DC

## 2017-01-10 NOTE — Telephone Encounter (Signed)
Last ov  11/05/16 Last filled 2016

## 2017-01-14 ENCOUNTER — Ambulatory Visit (INDEPENDENT_AMBULATORY_CARE_PROVIDER_SITE_OTHER): Payer: 59 | Admitting: Psychiatry

## 2017-01-14 ENCOUNTER — Encounter: Payer: Self-pay | Admitting: Psychiatry

## 2017-01-14 VITALS — BP 111/66 | HR 80 | Temp 97.7°F | Wt 144.0 lb

## 2017-01-14 DIAGNOSIS — F413 Other mixed anxiety disorders: Secondary | ICD-10-CM | POA: Diagnosis not present

## 2017-01-14 DIAGNOSIS — F3160 Bipolar disorder, current episode mixed, unspecified: Secondary | ICD-10-CM | POA: Diagnosis not present

## 2017-01-14 MED ORDER — BUSPIRONE HCL 5 MG PO TABS: 5.0000 mg | ORAL_TABLET | Freq: Every evening | ORAL | 2 refills | Status: DC

## 2017-01-14 MED ORDER — LITHIUM CARBONATE 150 MG PO CAPS: 150.0000 mg | ORAL_CAPSULE | Freq: Every morning | ORAL | 1 refills | Status: DC

## 2017-01-14 MED ORDER — VENLAFAXINE HCL ER 75 MG PO CP24: 75.0000 mg | ORAL_CAPSULE | Freq: Every day | ORAL | 1 refills | Status: DC

## 2017-01-14 MED ORDER — QUETIAPINE FUMARATE 50 MG PO TABS: 25.0000 mg | ORAL_TABLET | Freq: Every day | ORAL | 1 refills | Status: DC
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
## 2017-01-28 ENCOUNTER — Encounter: Payer: Self-pay | Admitting: Psychiatry

## 2017-01-28 ENCOUNTER — Ambulatory Visit (INDEPENDENT_AMBULATORY_CARE_PROVIDER_SITE_OTHER): Payer: 59 | Admitting: Psychiatry

## 2017-01-28 VITALS — BP 119/85 | HR 82 | Temp 97.8°F | Wt 142.4 lb

## 2017-01-28 DIAGNOSIS — F3160 Bipolar disorder, current episode mixed, unspecified: Secondary | ICD-10-CM

## 2017-01-28 MED ORDER — VENLAFAXINE HCL ER 75 MG PO CP24: 75.0000 mg | ORAL_CAPSULE | Freq: Every day | ORAL | 1 refills | Status: DC

## 2017-01-28 MED ORDER — QUETIAPINE FUMARATE 25 MG PO TABS: 25.0000 mg | ORAL_TABLET | Freq: Every day | ORAL | 1 refills | Status: DC

## 2017-01-28 MED ORDER — LITHIUM CARBONATE 150 MG PO CAPS: 150.0000 mg | ORAL_CAPSULE | Freq: Every morning | ORAL | 1 refills | Status: DC
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
## 2017-03-06 ENCOUNTER — Ambulatory Visit (INDEPENDENT_AMBULATORY_CARE_PROVIDER_SITE_OTHER): Payer: Medicare Other | Admitting: Family Medicine

## 2017-03-06 ENCOUNTER — Encounter: Payer: Self-pay | Admitting: Family Medicine

## 2017-03-06 VITALS — BP 101/67 | HR 80 | Ht 59.0 in | Wt 139.6 lb

## 2017-03-06 DIAGNOSIS — F319 Bipolar disorder, unspecified: Secondary | ICD-10-CM | POA: Diagnosis not present

## 2017-03-06 DIAGNOSIS — Z1239 Encounter for other screening for malignant neoplasm of breast: Secondary | ICD-10-CM

## 2017-03-06 DIAGNOSIS — E559 Vitamin D deficiency, unspecified: Secondary | ICD-10-CM

## 2017-03-06 DIAGNOSIS — Z1231 Encounter for screening mammogram for malignant neoplasm of breast: Secondary | ICD-10-CM | POA: Diagnosis not present

## 2017-03-06 DIAGNOSIS — R5382 Chronic fatigue, unspecified: Secondary | ICD-10-CM | POA: Diagnosis not present

## 2017-03-06 DIAGNOSIS — E782 Mixed hyperlipidemia: Secondary | ICD-10-CM

## 2017-03-06 NOTE — Progress Notes (Signed)
Subjective:    Patient ID: Mackenzie Ward, female    DOB: 1946-02-08, 71 y.o.   MRN: [id number]  Mackenzie Ward is a 71 y.o. female presenting on 03/06/2017 for Hyperlipidemia (fatigue, pt concern about the decrease in energy  x 1 yr)   HPI   HYPERLIPIDEMIA: - Reports she was previously on Simvastatin, then stopped due to myalgias. Last lipid panel 10/2016, abnormal, elevated LDL and low HDL - Limited regular exercise  Fatigue / Reduced Energy / History of Bipolar Depression / Vitamin D Deficiency - Followed by Dr Sharlot Gowda, she is concerned about possible medication side effect for her fatigue symptoms - No new concerns only persistent fatigue reduced energy for >1 year without acute change - Admits to sleeping well, feels rested, not waking up overnight - History of low Vitamin D in past 11.5. Was taking D3 2,000 unit daily for period of time then off now, asking about what vitamins to day regularly - Denies worsening mood, suicidal or homicidal ideation, anxiety, agitation, daytime sleepiness, apnea, chest pain or tightness, exertional symptoms  Tobacco Abuse - Unsuccessful in past trying to quit. Remains active smoker. Tried Quitline, Hyponosis  Health Maintenance: - Due for Mammogram screening, has history of abnormal in past with benign nodule s/p biopsy, no known personal or family history of breast cancer. Last mammogram 2015. She is concerned about how many more mammograms she needs - Due for Pneumovax-23, last vaccine prevnar-13 in 2015, declines now wants to get in 10/2017  Depression screen [facility] 2/9 03/06/2016 11/08/2015 09/22/2015  Decreased Interest '3 1 3  ' Down, Depressed, Hopeless '1 1 1  ' PHQ - 2 Score '4 2 4  ' Altered sleeping '3 1 2  ' Tired, decreased energy '3 2 3  ' Change in appetite '1 1 3  ' Feeling bad or failure about yourself  '2 1 3  ' Trouble concentrating '3 3 3  ' Moving slowly or fidgety/restless 0 0 0  Suicidal thoughts 0 0 0  PHQ-9 Score '16 10 18  ' Difficult  doing work/chores Somewhat difficult Somewhat difficult Very difficult    Social History  Substance Use Topics  . Smoking status: Current Every Day Smoker    Packs/day: 1.00    Years: 45.00    Types: Cigarettes    Last attempt to quit: 11/21/2015  . Smokeless tobacco: Never Used  . Alcohol use No    Review of Systems Per HPI unless specifically indicated above     Objective:    BP 101/67 (BP Location: Right Arm, Patient Position: Sitting, Cuff Size: Normal)   Pulse 80   Ht '4\' 11"'  (1.499 m)   Wt 139 lb 9.6 oz (63.3 kg)   BMI 28.20 kg/m   Wt Readings from Last 3 Encounters:  03/06/17 139 lb 9.6 oz (63.3 kg)  11/05/16 143 lb (64.9 kg)  07/30/16 145 lb (65.8 kg)    Physical Exam  Constitutional: She is oriented to person, place, and time. She appears well-developed and well-nourished. No distress.  Well-appearing, comfortable, cooperative  HENT:  Head: Normocephalic and atraumatic.  Mouth/Throat: Oropharynx is clear and moist.  Eyes: Conjunctivae are normal. Right eye exhibits no discharge. Left eye exhibits no discharge.  Neck: Normal range of motion. Neck supple. No thyromegaly present.  Cardiovascular: Normal rate, regular rhythm and normal heart sounds.   No murmur heard. Pulmonary/Chest: Effort normal and breath sounds normal. No respiratory distress. She has no wheezes. She has no rales.  Musculoskeletal: She exhibits no edema.  Lymphadenopathy:  She has no cervical adenopathy.  Neurological: She is alert and oriented to person, place, and time.  Skin: Skin is warm and dry. No rash noted. She is not diaphoretic. No erythema.  Psychiatric: She has a normal mood and affect. Her behavior is normal.  Nursing note and vitals reviewed.  Results for orders placed or performed in visit on 11/05/16  Uric acid  Result Value Ref Range   Uric Acid, Serum 4.6 2.5 - 7.0 mg/dL  COMPLETE METABOLIC PANEL WITH GFR  Result Value Ref Range   Sodium 137 135 - 146 mmol/L    Potassium 4.5 3.5 - 5.3 mmol/L   Chloride 105 98 - 110 mmol/L   CO2 26 20 - 31 mmol/L   Glucose, Bld 75 65 - 99 mg/dL   BUN 6 (L) 7 - 25 mg/dL   Creat 0.65 0.50 - 0.99 mg/dL   Total Bilirubin 0.3 0.2 - 1.2 mg/dL   Alkaline Phosphatase 108 33 - 130 U/L   AST 17 10 - 35 U/L   ALT 15 6 - 29 U/L   Total Protein 6.5 6.1 - 8.1 g/dL   Albumin 3.7 3.6 - 5.1 g/dL   Calcium 9.1 8.6 - 10.4 mg/dL   GFR, Est African American >89 >=60 mL/min   GFR, Est Non African American >89 >=60 mL/min  CBC with Differential  Result Value Ref Range   WBC 10.4 3.8 - 10.8 K/uL   RBC 4.93 3.80 - 5.10 MIL/uL   Hemoglobin 14.6 11.7 - 15.5 g/dL   HCT 43.5 35.0 - 45.0 %   MCV 88.2 80.0 - 100.0 fL   MCH 29.6 27.0 - 33.0 pg   MCHC 33.6 32.0 - 36.0 g/dL   RDW 14.3 11.0 - 15.0 %   Platelets 354 140 - 400 K/uL   MPV 10.4 7.5 - 12.5 fL   Neutro Abs 5,928 1,500 - 7,800 cells/uL   Lymphs Abs 3,224 850 - 3,900 cells/uL   Monocytes Absolute 1,144 (H) 200 - 950 cells/uL   Eosinophils Absolute 104 15 - 500 cells/uL   Basophils Absolute 0 0 - 200 cells/uL   Neutrophils Relative % 57 %   Lymphocytes Relative 31 %   Monocytes Relative 11 %   Eosinophils Relative 1 %   Basophils Relative 0 %   Smear Review Criteria for review not met   Lipid Profile  Result Value Ref Range   Cholesterol 230 (H) <200 mg/dL   Triglycerides 133 <150 mg/dL   HDL 50 (L) >50 mg/dL   Total CHOL/HDL Ratio 4.6 <5.0 Ratio   VLDL 27 <30 mg/dL   LDL Cholesterol 153 (H) <100 mg/dL      Assessment & Plan:   Problem List Items Addressed This Visit    Vitamin D deficiency    Prior vitamin D deficiency, last check 11.5 (12/2015) Off current supplement, did not adhere to therapeutic dose Recommend resume OTC Vitamin D3 5,000 iu daily for 3 months, then lower dose to 2,000 daily maintenance Re-check Vitamin D with upcoming labs 8 months      Mixed hyperlipidemia    Uncontrolled cholesterol no longer on statin, limited lifestyle Last lipid  panel 10/2016 Elevated ASCVD risk score  Plan: 1. Counseling on reducing ASCVD risk - may consider low dose other statin rosuvastatin intermittent titrate up dosing, and or start ASA 30m for primary ASCVD risk reduction 3. Encourage improved lifestyle - low carb/cholesterol, reduce portion size, start regular exercise 4. Follow-up 8 months, labs  Fatigue - Primary    Suspected multifactorial etiology with age, co-morbidities, mood/depression/bipolar, multiple medications, vitamin D deficiency, possibly other vitamin/nutritional def, maybe related to lack of regular exercise  Plan: 1. Resume vitamin D therapeutic dose, then maintenance, re-check with next labs 8 months 2. Encourage improve regular exercise, healthy diet, hydration, sleep 3. Advised her to ask Psychiatry to review med side effects 4. Follow-up within 8 months for labs      Bipolar 1 disorder ([place])    Stable chronic problem, recent PHQ increased Followed by Dr Gretel Acre, [facility] No change to meds recently - Lithium, Quetiapine, Venlafaxine, consider potential med side effect affecting her energy       Other Visit Diagnoses    Screening for breast cancer       Possibly last mammogram, per patient preference. No significant abnormal, did have benign biopsy in past   Relevant Orders   MM SCREENING BREAST TOMO BILATERAL      No orders of the defined types were placed in this encounter.   Follow up plan: Return in about 8 months (around 11/06/2017) for CPE Medicare.   Nobie Putnam, [facility] 03/07/2017, 12:39 AM

## 2017-03-06 NOTE — Patient Instructions (Addendum)
Thank you for coming to the clinic today.  1. You had low vitamin D before, recommend taking Vitamin D3 5,000 units daily for 3 months, then resume lower dose supplement Vitamin D3 2,000 units daily every day  You may also take a regular multi vitamin including B vitamins and Vitamin B12 up to 1000mg  daily for energy  2. Consider in future taking low dose other Statin cholesterol med such as Crestor or Rosuvastatin, also may consider taking baby aspirin 81mg  daily to help reduce risk of heart attack or stroke  [facility] [place], [place] [address] Phone: [phone] Call anytime to schedule now that order has been placed.   You will be due for FASTING BLOOD WORK (no food or drink after midnight before, only water or coffee without cream/sugar on the morning of)  - Please go ahead and schedule a "Lab Only" visit in the morning at the clinic for lab draw in 8 months  - Before annual physical - Make sure Lab Only appointment is at least 1-2 weeks before your next appointment, so that results will be available  For Lab Results, once available within 2-3 days of blood draw, you can can log in to MyChart online to view your results and a brief explanation. Also, we can discuss results at next follow-up visit.   Please schedule a Follow-up Appointment to: Return in about 8 months (around 11/06/2017) for CPE Medicare.  If you have any other questions or concerns, please feel free to call the clinic or send a message through [place]. You may also schedule an earlier appointment if necessary.  Additionally, you may be receiving a survey about your experience at our clinic within a few days to 1 week by e-mail or mail. We value your feedback.  Nobie Putnam, DO [place]

## 2017-03-07 ENCOUNTER — Other Ambulatory Visit: Payer: Self-pay | Admitting: Family Medicine

## 2017-03-07 DIAGNOSIS — R799 Abnormal finding of blood chemistry, unspecified: Secondary | ICD-10-CM

## 2017-03-07 DIAGNOSIS — E782 Mixed hyperlipidemia: Secondary | ICD-10-CM

## 2017-03-07 DIAGNOSIS — R5382 Chronic fatigue, unspecified: Secondary | ICD-10-CM

## 2017-03-07 DIAGNOSIS — E559 Vitamin D deficiency, unspecified: Secondary | ICD-10-CM

## 2017-03-07 DIAGNOSIS — F319 Bipolar disorder, unspecified: Secondary | ICD-10-CM

## 2017-03-07 DIAGNOSIS — Z79899 Other long term (current) drug therapy: Secondary | ICD-10-CM

## 2017-03-07 NOTE — Assessment & Plan Note (Signed)
Suspected multifactorial etiology with age, co-morbidities, mood/depression/bipolar, multiple medications, vitamin D deficiency, possibly other vitamin/nutritional def, maybe related to lack of regular exercise  Plan: 1. Resume vitamin D therapeutic dose, then maintenance, re-check with next labs 8 months 2. Encourage improve regular exercise, healthy diet, hydration, sleep 3. Advised her to ask Psychiatry to review med side effects 4. Follow-up within 8 months for labs

## 2017-03-07 NOTE — Assessment & Plan Note (Addendum)
Stable chronic problem, recent PHQ increased Followed by Dr Gretel Acre, [facility] No change to meds recently - Lithium, Quetiapine, Venlafaxine, consider potential med side effect affecting her energy

## 2017-03-07 NOTE — Assessment & Plan Note (Signed)
Uncontrolled cholesterol no longer on statin, limited lifestyle Last lipid panel 10/2016 Elevated ASCVD risk score  Plan: 1. Counseling on reducing ASCVD risk - may consider low dose other statin rosuvastatin intermittent titrate up dosing, and or start ASA 81mg  for primary ASCVD risk reduction 3. Encourage improved lifestyle - low carb/cholesterol, reduce portion size, start regular exercise 4. Follow-up 8 months, labs

## 2017-03-07 NOTE — Assessment & Plan Note (Signed)
Prior vitamin D deficiency, last check 11.5 (12/2015) Off current supplement, did not adhere to therapeutic dose Recommend resume OTC Vitamin D3 5,000 iu daily for 3 months, then lower dose to 2,000 daily maintenance Re-check Vitamin D with upcoming labs 8 months

## 2017-04-29 ENCOUNTER — Ambulatory Visit: Payer: Medicare Other | Admitting: Psychiatry

## 2017-04-29 ENCOUNTER — Telehealth: Payer: Self-pay

## 2017-04-29 NOTE — Telephone Encounter (Signed)
  Pt thought her appt was for today but she is seeing you next Tuesday the 28th.   Pt needs enough medication to get to her appt next week.    Order Providers   Prescribing Provider Encounter Provider  Rainey Pines, MD Rainey Pines, MD  Medication Detail    Disp Refills Start End   QUEtiapine (SEROQUEL) 25 MG tablet 30 tablet 1 01/28/2017    Sig - Route: Take 1 tablet (25 mg total) by mouth at bedtime. - Oral   Class: No Print   Pharmacy   [place], [address]

## 2017-05-07 ENCOUNTER — Ambulatory Visit (INDEPENDENT_AMBULATORY_CARE_PROVIDER_SITE_OTHER): Payer: 59 | Admitting: Psychiatry

## 2017-05-07 ENCOUNTER — Encounter: Payer: Self-pay | Admitting: Psychiatry

## 2017-05-07 VITALS — BP 117/69 | HR 103 | Temp 97.8°F | Wt 134.0 lb

## 2017-05-07 DIAGNOSIS — F3160 Bipolar disorder, current episode mixed, unspecified: Secondary | ICD-10-CM

## 2017-05-07 MED ORDER — QUETIAPINE FUMARATE 25 MG PO TABS: 25.0000 mg | ORAL_TABLET | Freq: Every day | ORAL | 2 refills | Status: DC

## 2017-05-07 MED ORDER — VENLAFAXINE HCL ER 75 MG PO CP24: 75.0000 mg | ORAL_CAPSULE | Freq: Every day | ORAL | 1 refills | Status: DC

## 2017-05-07 MED ORDER — LITHIUM CARBONATE 150 MG PO CAPS: 150.0000 mg | ORAL_CAPSULE | Freq: Every morning | ORAL | 1 refills | Status: DC
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
## 2017-05-10 ENCOUNTER — Other Ambulatory Visit: Payer: Self-pay | Admitting: Psychiatry

## 2017-05-14 ENCOUNTER — Telehealth: Payer: Self-pay

## 2017-05-14 NOTE — Telephone Encounter (Signed)
pt called states that the pharmacy did not received the seroquel rx. please send in rx to seroquel to [facility],     Disp Refills Start End   QUEtiapine (SEROQUEL) 25 MG tablet 30 tablet 2 05/07/2017    Sig - Route: Take 1 tablet (25 mg total) by mouth at bedtime. - Oral   Class: No Print

## 2017-05-14 NOTE — Telephone Encounter (Signed)
Please call the pharmacy to confirm that they do not have the prescription. Epic shows the prescriptions.

## 2017-05-15 NOTE — Telephone Encounter (Signed)
They have not received anything.

## 2017-06-18 ENCOUNTER — Ambulatory Visit
Admission: RE | Admit: 2017-06-18 | Discharge: 2017-06-18 | Disposition: A | Discharge status: Home / Self Care | Payer: Medicare Other | Source: Ambulatory Visit | Attending: Family Medicine | Admitting: Family Medicine

## 2017-06-18 DIAGNOSIS — Z1239 Encounter for other screening for malignant neoplasm of breast: Secondary | ICD-10-CM

## 2017-06-18 DIAGNOSIS — Z1231 Encounter for screening mammogram for malignant neoplasm of breast: Secondary | ICD-10-CM | POA: Diagnosis not present

## 2017-06-21 ENCOUNTER — Ambulatory Visit (INDEPENDENT_AMBULATORY_CARE_PROVIDER_SITE_OTHER): Payer: Medicare Other | Admitting: Family Medicine

## 2017-06-21 ENCOUNTER — Encounter: Payer: Self-pay | Admitting: Family Medicine

## 2017-06-21 VITALS — BP 118/70 | HR 81 | Temp 98.4°F | Resp 16 | Ht 59.0 in | Wt 134.0 lb

## 2017-06-21 DIAGNOSIS — Z23 Encounter for immunization: Secondary | ICD-10-CM | POA: Diagnosis not present

## 2017-06-21 DIAGNOSIS — B0229 Other postherpetic nervous system involvement: Secondary | ICD-10-CM

## 2017-06-21 DIAGNOSIS — B029 Zoster without complications: Secondary | ICD-10-CM | POA: Diagnosis not present

## 2017-06-21 MED ORDER — GABAPENTIN 100 MG PO CAPS: ORAL_CAPSULE | ORAL | 1 refills | Status: DC

## 2017-06-21 MED ORDER — VALACYCLOVIR HCL 1 G PO TABS: 1000.0000 mg | ORAL_TABLET | Freq: Three times a day (TID) | ORAL | 0 refills | Status: DC

## 2017-06-21 NOTE — Patient Instructions (Addendum)
Thank you for coming to the clinic today.  1. I am concerned that you may have a shingles rash on scalp and face  It has been 1 week so the medicine may not be as effective  Go ahead and take Valtrex 3 times daily for 7 days to help it heal  Once scabs heal you may still have some tingling, burning and pain, this is "Post herpetic Neuralgia"  Start Gabapentin 100mg  capsules, take at night for 2-3 nights only, and then increase to 2 times a day for a few days, and then may increase to 3 times a day, it may make you drowsy, if helps significantly at night only, then you can increase instead to 3 capsules at night, instead of 3 times a day  Also we checked your Left eye for any involvement of the Shingles, I do not see this on the Fluorescein Dye Test today.  I do recommend calling [facility] and notifying them as they may try to work you in today or early next week.  Please schedule a Follow-up Appointment to: Return in about 2 weeks (around 07/05/2017), or if symptoms worsen or fail to improve, for shingles, otherwise keep apt for February.  If you have any other questions or concerns, please feel free to call the clinic or send a message through [place]. You may also schedule an earlier appointment if necessary.  Additionally, you may be receiving a survey about your experience at our clinic within a few days to 1 week by e-mail or mail. We value your feedback.  Nobie Putnam, DO [place]

## 2017-06-21 NOTE — Progress Notes (Signed)
Subjective:    Patient ID: Mackenzie Ward, female    DOB: 1945-09-20, 71 y.o.   MRN: [id number]  Mackenzie Ward is a 71 y.o. female presenting on 06/21/2017 for Herpes Zoster   HPI   SCALP RASH / PRESUMED SHINGLES - Reports new complaint today with possible Shingles rash started on her scalp left side about 1 week ago, initial had some tingling and itching and developed rash with some scabbing, states she thought it would improve but it did not and still present now, has some intermittent episodes of pain and tingling and burning. Some involvement also of Left upper eyelid and nose, but not affecting her eye. She has chronic L eye amblyopia and limited vision on this side, followed by [facility] - She attempted to schedule with [facility] for this rash, but they could not see her until 10/27 - Never had shingles before - Never had shingles vaccine - Denies fevers/chills, weakness, fatigue, loss of vision, hearing loss, headache sinus pain  Health Maintenance: - Due for Flu Shot, will receive today  Depression screen [facility] 2/9 03/06/2016 11/08/2015 09/22/2015  Decreased Interest _0 Down, Depressed, Hopeless _1 PHQ - 2 Score _2 Altered sleeping _3 Tired, decreased energy _4 Change in appetite _5 Feeling bad or failure about yourself  _6 Trouble concentrating _7 Moving slowly or fidgety/restless 0 0 0  Suicidal thoughts 0 0 0  PHQ-9 Score _8 Difficult doing work/chores Somewhat difficult Somewhat difficult Very difficult    Social History  Substance Use Topics  . Smoking status: Current Every Day Smoker    Packs/day: 1.00    Years: 45.00    Types: Cigarettes    Last attempt to quit: 11/21/2015  . Smokeless tobacco: Never Used  . Alcohol use No    Review of Systems Per HPI unless specifically indicated above     Objective:    BP 118/70   Pulse 81   Temp 98.4 F (36.9 C) (Oral)   Resp 16   Ht _9  (1.499 m)    Wt 134 lb (60.8 kg)   BMI 27.06 kg/m   Wt Readings from Last 3 Encounters:  06/21/17 134 lb (60.8 kg)  03/06/17 139 lb 9.6 oz (63.3 kg)  11/05/16 143 lb (64.9 kg)    Physical Exam  Constitutional: She is oriented to person, place, and time. She appears well-developed and well-nourished. No distress.  Well-appearing, comfortable, cooperative  HENT:  Head: Normocephalic and atraumatic.  Mouth/Throat: Oropharynx is clear and moist.  Eyes: Pupils are equal, round, and reactive to light. Conjunctivae and EOM are normal. Right eye exhibits no discharge. Left eye exhibits no discharge.  L eye chronic amblyopia, limited visual function at baseline.  No appearance of any lesions or conjunctival injection of left eye on exam.  Fluorescein dye eye test on L side with normal appearance of dye, no evidence of any shingles lesion or any corneal abrasion or pathology. All dye collected in bottom aspect corners of eye appropriately under black light.  Cardiovascular: Normal rate and intact distal pulses.   Pulmonary/Chest: Effort normal.  Musculoskeletal: She exhibits no edema.  Neurological: She is alert and oriented to person, place, and time.  Skin: Skin is warm and dry. Rash (Top left scalp lesion with multiple scab-like vesicular appearing rash appears dry and early healing stages,  does not cross midline extends forward towards forehead but does not extend) noted. She is not diaphoretic. No erythema.  Additional rash involving Left upper eyelid minimal redness and swelling and one spot on L aspect of nose ,see picture. No involvement of Left eye on appearance  Psychiatric: She has a normal mood and affect. Her behavior is normal.  Well groomed, good eye contact, normal speech and thoughts  Nursing note and vitals reviewed.     Left top of scalp     Left face / upper eyelid     Results for orders placed or performed in visit on 11/05/16  Uric acid  Result Value Ref Range   Uric Acid,  Serum 4.6 2.5 - 7.0 mg/dL  COMPLETE METABOLIC PANEL WITH GFR  Result Value Ref Range   Sodium 137 135 - 146 mmol/L   Potassium 4.5 3.5 - 5.3 mmol/L   Chloride 105 98 - 110 mmol/L   CO2 26 20 - 31 mmol/L   Glucose, Bld 75 65 - 99 mg/dL   BUN 6 (L) 7 - 25 mg/dL   Creat 0.65 0.50 - 0.99 mg/dL   Total Bilirubin 0.3 0.2 - 1.2 mg/dL   Alkaline Phosphatase 108 33 - 130 U/L   AST 17 10 - 35 U/L   ALT 15 6 - 29 U/L   Total Protein 6.5 6.1 - 8.1 g/dL   Albumin 3.7 3.6 - 5.1 g/dL   Calcium 9.1 8.6 - 10.4 mg/dL   GFR, Est African American >89 >=60 mL/min   GFR, Est Non African American >89 >=60 mL/min  CBC with Differential  Result Value Ref Range   WBC 10.4 3.8 - 10.8 K/uL   RBC 4.93 3.80 - 5.10 MIL/uL   Hemoglobin 14.6 11.7 - 15.5 g/dL   HCT 43.5 35.0 - 45.0 %   MCV 88.2 80.0 - 100.0 fL   MCH 29.6 27.0 - 33.0 pg   MCHC 33.6 32.0 - 36.0 g/dL   RDW 14.3 11.0 - 15.0 %   Platelets 354 140 - 400 K/uL   MPV 10.4 7.5 - 12.5 fL   Neutro Abs 5,928 1,500 - 7,800 cells/uL   Lymphs Abs 3,224 850 - 3,900 cells/uL   Monocytes Absolute 1,144 (H) 200 - 950 cells/uL   Eosinophils Absolute 104 15 - 500 cells/uL   Basophils Absolute 0 0 - 200 cells/uL   Neutrophils Relative % 57 %   Lymphocytes Relative 31 %   Monocytes Relative 11 %   Eosinophils Relative 1 %   Basophils Relative 0 %   Smear Review Criteria for review not met   Lipid Profile  Result Value Ref Range   Cholesterol 230 (H) <200 mg/dL   Triglycerides 133 <150 mg/dL   HDL 50 (L) >50 mg/dL   Total CHOL/HDL Ratio 4.6 <5.0 Ratio   VLDL 27 <30 mg/dL   LDL Cholesterol 153 (H) <100 mg/dL      Assessment & Plan:   Problem List Items Addressed This Visit    None    Visit Diagnoses    Herpes zoster without complication    -  Primary  Clinically consistent with new acute shingles herpes zoster outbreak onset 7 days ago, with mild to moderate neuropathic pain. Rash involving L scalp and L upper eyelid - Never received zoster  vaccine - Does not appear to involve L eye, but only eyelid. No other complication  Plan: 1. See exam for Fluorescein Dye L eye test - negative for ocular  involvement - Additionally recommended that as a precaution she notify her [facility] doctor about this and they may want to re-evaluate her Left eye today or early next week 2. Start Valacyclovir 1070m TID with food x 7 days, counseled on treatment course and side effects 3. Discussed additional neuropathic pain med options such as gabapentin - new rx written may only take nightly for now can titrate up as needed 4. Follow-up as needed within 2-4 weeks if not resolving or if residual pain, strict return criteria if ocular involvement or other acute concerns      Relevant Medications   valACYclovir (VALTREX) 1000 MG tablet   Post herpetic neuralgia       Relevant Medications   gabapentin (NEURONTIN) 100 MG capsule   Needs flu shot       Relevant Orders   Flu vaccine HIGH DOSE PF (Completed)      Meds ordered this encounter  Medications  . valACYclovir (VALTREX) 1000 MG tablet    Sig: Take 1 tablet (1,000 mg total) by mouth 3 (three) times daily. For shingles    Dispense:  21 tablet    Refill:  0  . gabapentin (NEURONTIN) 100 MG capsule    Sig: Start 1 capsule daily, increase by 1 cap every 2-3 days as tolerated up to 3 times a day, or may take 3 at once in evening.    Dispense:  30 capsule    Refill:  1    Follow up plan: Return in about 2 weeks (around 07/05/2017), or if symptoms worsen or fail to improve, for shingles, otherwise keep apt for February.  ANobie Putnam DPlattvilleMedical Group 06/21/2017, 12:51 PM

## 2017-07-29 ENCOUNTER — Encounter: Payer: Self-pay | Admitting: Psychiatry

## 2017-07-29 ENCOUNTER — Ambulatory Visit: Payer: Medicare Other | Admitting: Psychiatry

## 2017-07-29 DIAGNOSIS — F3160 Bipolar disorder, current episode mixed, unspecified: Secondary | ICD-10-CM

## 2017-07-29 MED ORDER — LITHIUM CARBONATE 150 MG PO CAPS: 150.0000 mg | ORAL_CAPSULE | Freq: Two times a day (BID) | ORAL | 1 refills | Status: DC

## 2017-07-29 MED ORDER — VENLAFAXINE HCL ER 75 MG PO CP24: 75.0000 mg | ORAL_CAPSULE | Freq: Every day | ORAL | 1 refills | Status: DC

## 2017-07-29 MED ORDER — QUETIAPINE FUMARATE 25 MG PO TABS: 25.0000 mg | ORAL_TABLET | Freq: Every day | ORAL | 2 refills | Status: DC

## 2017-07-29 MED ORDER — HYDROXYZINE HCL 10 MG PO TABS: 10.0000 mg | ORAL_TABLET | Freq: Three times a day (TID) | ORAL | 2 refills | Status: DC | PRN
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
## 2017-08-12 ENCOUNTER — Other Ambulatory Visit: Payer: Self-pay | Admitting: Psychiatry

## 2017-09-09 ENCOUNTER — Other Ambulatory Visit: Payer: Self-pay | Admitting: Psychiatry

## 2017-09-30 ENCOUNTER — Encounter: Payer: Self-pay | Admitting: Psychiatry

## 2017-09-30 ENCOUNTER — Other Ambulatory Visit: Payer: Self-pay

## 2017-09-30 ENCOUNTER — Ambulatory Visit: Payer: Medicare Other | Admitting: Psychiatry

## 2017-09-30 VITALS — BP 113/72 | HR 116 | Temp 97.2°F | Wt 125.8 lb

## 2017-09-30 DIAGNOSIS — F413 Other mixed anxiety disorders: Secondary | ICD-10-CM | POA: Diagnosis not present

## 2017-09-30 DIAGNOSIS — F3160 Bipolar disorder, current episode mixed, unspecified: Secondary | ICD-10-CM | POA: Diagnosis not present

## 2017-09-30 MED ORDER — QUETIAPINE FUMARATE 25 MG PO TABS: 25.0000 mg | ORAL_TABLET | Freq: Every day | ORAL | 2 refills | Status: DC

## 2017-09-30 MED ORDER — LITHIUM CARBONATE 150 MG PO CAPS: 150.0000 mg | ORAL_CAPSULE | Freq: Two times a day (BID) | ORAL | 1 refills | Status: DC

## 2017-09-30 MED ORDER — VENLAFAXINE HCL ER 75 MG PO CP24: 75.0000 mg | ORAL_CAPSULE | Freq: Every day | ORAL | 1 refills | Status: DC
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
## 2017-10-15 ENCOUNTER — Other Ambulatory Visit: Payer: Medicare Other

## 2017-10-15 DIAGNOSIS — F319 Bipolar disorder, unspecified: Secondary | ICD-10-CM

## 2017-10-15 DIAGNOSIS — E559 Vitamin D deficiency, unspecified: Secondary | ICD-10-CM

## 2017-10-15 DIAGNOSIS — Z79899 Other long term (current) drug therapy: Secondary | ICD-10-CM

## 2017-10-15 DIAGNOSIS — R799 Abnormal finding of blood chemistry, unspecified: Secondary | ICD-10-CM

## 2017-10-15 DIAGNOSIS — R5382 Chronic fatigue, unspecified: Secondary | ICD-10-CM

## 2017-10-15 DIAGNOSIS — E782 Mixed hyperlipidemia: Secondary | ICD-10-CM

## 2017-10-16 LAB — CBC WITH DIFFERENTIAL/PLATELET
BASOS ABS: 47 {cells}/uL (ref 0–200)
Basophils Relative: 0.6 %
EOS ABS: 47 {cells}/uL (ref 15–500)
Eosinophils Relative: 0.6 %
HCT: 47.2 % — ABNORMAL HIGH (ref 35.0–45.0)
Hemoglobin: 15.5 g/dL (ref 11.7–15.5)
Lymphs Abs: 2433 cells/uL (ref 850–3900)
MCH: 28.2 pg (ref 27.0–33.0)
MCHC: 32.8 g/dL (ref 32.0–36.0)
MCV: 85.8 fL (ref 80.0–100.0)
MONOS PCT: 13 %
MPV: 10.9 fL (ref 7.5–12.5)
Neutro Abs: 4345 cells/uL (ref 1500–7800)
Neutrophils Relative %: 55 %
PLATELETS: 363 10*3/uL (ref 140–400)
RBC: 5.5 10*6/uL — ABNORMAL HIGH (ref 3.80–5.10)
RDW: 13.8 % (ref 11.0–15.0)
TOTAL LYMPHOCYTE: 30.8 %
WBC mixed population: 1027 cells/uL — ABNORMAL HIGH (ref 200–950)
WBC: 7.9 10*3/uL (ref 3.8–10.8)

## 2017-10-16 LAB — HEMOGLOBIN A1C
HEMOGLOBIN A1C: 5.5 %{Hb} (ref <5.7)
MEAN PLASMA GLUCOSE: 111 (calc)
eAG (mmol/L): 6.2 (calc)

## 2017-10-16 LAB — COMPREHENSIVE METABOLIC PANEL
AG Ratio: 1.5 (calc) (ref 1.0–2.5)
ALBUMIN MSPROF: 3.8 g/dL (ref 3.6–5.1)
ALKALINE PHOSPHATASE (APISO): 114 U/L (ref 33–130)
ALT: 17 U/L (ref 6–29)
AST: 19 U/L (ref 10–35)
BILIRUBIN TOTAL: 0.3 mg/dL (ref 0.2–1.2)
BUN: 8 mg/dL (ref 7–25)
CO2: 24 mmol/L (ref 20–32)
CREATININE: 0.71 mg/dL (ref 0.60–0.93)
Calcium: 9.1 mg/dL (ref 8.6–10.4)
Chloride: 106 mmol/L (ref 98–110)
GLOBULIN: 2.5 g/dL (ref 1.9–3.7)
Glucose, Bld: 86 mg/dL (ref 65–99)
POTASSIUM: 4.5 mmol/L (ref 3.5–5.3)
SODIUM: 136 mmol/L (ref 135–146)
Total Protein: 6.3 g/dL (ref 6.1–8.1)

## 2017-10-16 LAB — LIPID PANEL
CHOL/HDL RATIO: 4.4 (calc) (ref <5.0)
CHOLESTEROL: 225 mg/dL — AB (ref <200)
HDL: 51 mg/dL (ref >50)
LDL CHOLESTEROL (CALC): 145 mg/dL — AB
NON-HDL CHOLESTEROL (CALC): 174 mg/dL — AB (ref <130)
Triglycerides: 154 mg/dL — ABNORMAL HIGH (ref <150)

## 2017-10-16 LAB — VITAMIN B12: Vitamin B-12: 457 pg/mL (ref 200–1100)

## 2017-10-16 LAB — TSH: TSH: 3.37 m[IU]/L (ref 0.40–4.50)

## 2017-10-16 LAB — VITAMIN D 25 HYDROXY (VIT D DEFICIENCY, FRACTURES): Vit D, 25-Hydroxy: 26 ng/mL — ABNORMAL LOW (ref 30–100)

## 2017-10-22 ENCOUNTER — Ambulatory Visit (INDEPENDENT_AMBULATORY_CARE_PROVIDER_SITE_OTHER): Payer: Medicare Other | Admitting: Family Medicine

## 2017-10-22 ENCOUNTER — Ambulatory Visit (INDEPENDENT_AMBULATORY_CARE_PROVIDER_SITE_OTHER): Payer: Medicare Other

## 2017-10-22 ENCOUNTER — Ambulatory Visit: Payer: Self-pay

## 2017-10-22 ENCOUNTER — Encounter: Payer: Self-pay | Admitting: Family Medicine

## 2017-10-22 ENCOUNTER — Telehealth: Payer: Self-pay | Admitting: *Deleted

## 2017-10-22 VITALS — BP 122/70 | HR 62 | Temp 97.7°F | Resp 16 | Ht 59.0 in | Wt 122.2 lb

## 2017-10-22 DIAGNOSIS — F319 Bipolar disorder, unspecified: Secondary | ICD-10-CM | POA: Diagnosis not present

## 2017-10-22 DIAGNOSIS — Z0001 Encounter for general adult medical examination with abnormal findings: Secondary | ICD-10-CM | POA: Diagnosis not present

## 2017-10-22 DIAGNOSIS — R5382 Chronic fatigue, unspecified: Secondary | ICD-10-CM

## 2017-10-22 DIAGNOSIS — E782 Mixed hyperlipidemia: Secondary | ICD-10-CM | POA: Diagnosis not present

## 2017-10-22 DIAGNOSIS — F329 Major depressive disorder, single episode, unspecified: Secondary | ICD-10-CM | POA: Diagnosis not present

## 2017-10-22 DIAGNOSIS — R0609 Other forms of dyspnea: Secondary | ICD-10-CM | POA: Diagnosis not present

## 2017-10-22 DIAGNOSIS — Z87891 Personal history of nicotine dependence: Secondary | ICD-10-CM

## 2017-10-22 DIAGNOSIS — Z720 Tobacco use: Secondary | ICD-10-CM | POA: Diagnosis not present

## 2017-10-22 DIAGNOSIS — E559 Vitamin D deficiency, unspecified: Secondary | ICD-10-CM | POA: Diagnosis not present

## 2017-10-22 DIAGNOSIS — Z0000 Encounter for general adult medical examination without abnormal findings: Secondary | ICD-10-CM | POA: Diagnosis not present

## 2017-10-22 DIAGNOSIS — F419 Anxiety disorder, unspecified: Secondary | ICD-10-CM

## 2017-10-22 DIAGNOSIS — F32A Depression, unspecified: Secondary | ICD-10-CM

## 2017-10-22 DIAGNOSIS — Z23 Encounter for immunization: Secondary | ICD-10-CM | POA: Diagnosis not present

## 2017-10-22 DIAGNOSIS — J432 Centrilobular emphysema: Secondary | ICD-10-CM | POA: Insufficient documentation

## 2017-10-22 DIAGNOSIS — J449 Chronic obstructive pulmonary disease, unspecified: Secondary | ICD-10-CM | POA: Diagnosis not present

## 2017-10-22 DIAGNOSIS — Z122 Encounter for screening for malignant neoplasm of respiratory organs: Secondary | ICD-10-CM

## 2017-10-22 MED ORDER — TIOTROPIUM BROMIDE MONOHYDRATE 18 MCG IN CAPS: 18.0000 ug | ORAL_CAPSULE | Freq: Every day | RESPIRATORY_TRACT | 12 refills | Status: DC

## 2017-10-22 NOTE — Assessment & Plan Note (Addendum)
Improved Vitamin D deficiency. Could have contributed to energy, suspect other factors more related Now Vit D up from 11 to 24 Recommend continue Vitamin D3 2,000 iu daily maintenance

## 2017-10-22 NOTE — Assessment & Plan Note (Signed)
Active smoker, not quite ready to quit Has had some success in past 1 yr, familiar with quitline Has some sequela of chronic smoking with sputum production etc No formal dx COPD, see A&P - started Spiriva Recommend Low Dose Lung CA CT scan - through [facility], handout and info given, this was initially set up by Tyler Aas LPN from medicare wellness visit today, they should contact patient with apt, she is hesitant and may not schedule

## 2017-10-22 NOTE — Patient Instructions (Addendum)
Thank you for coming to the office today.  1.  Most likely have a mild form of COPD with your symptoms  Try Spiriva inhaler once daily for next 4-6 weeks let me know if we need to adjust sooner may try other inhaler   Low Dose Chest CT Lung CA Screening - Age 71-74 - Smoking history >30 pack years - Annual screening for 15 years after quit date  [place] Smoking Cessation Class Ph: [place] [facility]) Burgess Estelle, RN, OCN [phone]  2.   Encourage you to try quit smoking as we discussed for your breathing  [phone]-QUIT [phone]   Please schedule a Follow-up Appointment to: Return in about 3 months (around 01/19/2018) for Breathing/COPD med adjust / CT result.    If you have any other questions or concerns, please feel free to call the office or send a message through [place]. You may also schedule an earlier appointment if necessary.  Additionally, you may be receiving a survey about your experience at our office within a few days to 1 week by e-mail or mail. We value your feedback.  Nobie Putnam, DO [place]

## 2017-10-22 NOTE — Assessment & Plan Note (Signed)
Suspected multifactorial No clear cause with labs, Vit D still mild low but improved Not thyroid or vitamin B12 Likely related to Psychiatric diagnosis with Bipolar Mood Disorder on multiple medications can cause sedation, seemed to have improved energy now that this is better controlled

## 2017-10-22 NOTE — Telephone Encounter (Signed)
Received referral for initial lung cancer screening scan. Contacted patient and obtained smoking history,(current, 45 pack year) as well as answering questions related to screening process. Patient denies signs of lung cancer such as weight loss or hemoptysis. Patient denies comorbidity that would prevent curative treatment if lung cancer were found. Patient is scheduled for shared decision making visit and CT scan on 11/12/17.

## 2017-10-22 NOTE — Assessment & Plan Note (Signed)
Related to bipolar mood disorder See A&P

## 2017-10-22 NOTE — Progress Notes (Signed)
Subjective:   Mackenzie Ward is a 72 y.o. female who presents for Medicare Annual (Subsequent) preventive examination.  Review of Systems:   Cardiac Risk Factors include: advanced age (>21men, >65 women);smoking/ tobacco exposure     Objective:     Vitals: BP 122/70 (BP Location: Left Arm, Patient Position: Sitting)   Pulse 62   Temp 97.7 F (36.5 C) (Oral)   Resp 16   Ht 4\' 11"  (1.499 m)   Wt 122 lb 3.2 oz (55.4 kg)   BMI 24.68 kg/m   Body mass index is 24.68 kg/m.  Advanced Directives 10/22/2017  Does Patient Have a Medical Advance Directive? No  Would patient like information on creating a medical advance directive? Yes (MAU/Ambulatory/Procedural Areas - Information given)  Some encounter information is confidential and restricted. Go to Review Flowsheets activity to see all data.    Tobacco Social History   Tobacco Use  Smoking Status Current Every Day Smoker  . Packs/day: 1.00  . Years: 45.00  . Pack years: 45.00  . Types: Cigarettes  . Last attempt to quit: 11/21/2015  . Years since quitting: 1.9  Smokeless Tobacco Never Used     Ready to quit: No Counseling given: Yes   Clinical Intake:  Pre-visit preparation completed: Yes  Pain : No/denies pain     Nutritional Status: BMI of 19-24  Normal Nutritional Risks: None Diabetes: No  How often do you need to have someone help you when you read instructions, pamphlets, or other written materials from your doctor or pharmacy?: 1 - Never What is the last grade level you completed in school?: some college   Interpreter Needed?: No  Information entered by :: Tiffany Hill,LPN   Past Medical History:  Diagnosis Date  . Depression   . High cholesterol    Past Surgical History:  Procedure Laterality Date  . BREAST SURGERY Right 2013  . COLONOSCOPY  15 years ago  . EYE SURGERY    . TONSILLECTOMY     Family History  Problem Relation Age of Onset  . Lymphoma Mother        Non Hodgkin  .  Dementia Mother   . Alzheimer's disease Mother   . Lymphoma Sister        Non Hodgkin  . Depression Sister   . Heart attack Father   . Alcohol abuse Father    Social History   Socioeconomic History  . Marital status: Married    Spouse name: None  . Number of children: None  . Years of education: None  . Highest education level: None  Social Needs  . Financial resource strain: Not hard at all  . Food insecurity - worry: Never true  . Food insecurity - inability: Never true  . Transportation needs - medical: No  . Transportation needs - non-medical: No  Occupational History  . None  Tobacco Use  . Smoking status: Current Every Day Smoker    Packs/day: 1.00    Years: 45.00    Pack years: 45.00    Types: Cigarettes    Last attempt to quit: 11/21/2015    Years since quitting: 1.9  . Smokeless tobacco: Never Used  Substance and Sexual Activity  . Alcohol use: No    Alcohol/week: 0.0 oz  . Drug use: No  . Sexual activity: Not Currently  Other Topics Concern  . None  Social History Narrative  . None    Outpatient Encounter Medications as of 10/22/2017  Medication Sig  .  hydrOXYzine (ATARAX/VISTARIL) 10 MG tablet Take 1 tablet (10 mg total) 3 (three) times daily as needed by mouth.  . lithium carbonate 150 MG capsule Take 1 capsule (150 mg total) by mouth 2 (two) times daily with a meal.  . loratadine (CLARITIN) 10 MG tablet Take 1 tablet (10 mg total) by mouth daily.  . QUEtiapine (SEROQUEL) 25 MG tablet Take 1 tablet (25 mg total) by mouth at bedtime.  Marland Kitchen venlafaxine XR (EFFEXOR XR) 75 MG 24 hr capsule Take 1 capsule (75 mg total) by mouth daily with breakfast.  . [DISCONTINUED] Cholecalciferol (VITAMIN D) 2000 units CAPS Take 200 Units by mouth daily.  . [DISCONTINUED] Cyanocobalamin 2500 MCG CHEW Chew 1 tablet by mouth daily.  . [DISCONTINUED] vitamin E 400 UNIT capsule Take 400 Units by mouth daily.   No facility-administered encounter medications on file as of  10/22/2017.     Activities of Daily Living In your present state of health, do you have any difficulty performing the following activities: 10/22/2017  Hearing? N  Vision? Y  Comment having cataract surgery in the spring   Difficulty concentrating or making decisions? N  Walking or climbing stairs? Y  Comment SOB   Dressing or bathing? N  Doing errands, shopping? N  Preparing Food and eating ? N  Using the Toilet? N  In the past six months, have you accidently leaked urine? N  Do you have problems with loss of bowel control? N  Managing your Medications? N  Managing your Finances? N  Housekeeping or managing your Housekeeping? N  Some recent data might be hidden    Patient Care Team: Olin Hauser, DO as PCP - General (Family Medicine) Christene Lye, MD as Consulting Physician (General Surgery)    Assessment:   This is a routine wellness examination for Mackenzie Ward.  Exercise Activities and Dietary recommendations Current Exercise Habits: The patient does not participate in regular exercise at present, Exercise limited by: None identified  Goals    . Quit Smoking     Smoking cessation discussed       Fall Risk Fall Risk  10/22/2017 03/06/2017 03/06/2016 11/08/2015 09/22/2015  Falls in the past year? No No No Yes Yes  Number falls in past yr: - - - 2 or more 1  Injury with Fall? - - - Yes Yes  Comment - - - Cracked bone in foot -  Risk for fall due to : - - - - Impaired balance/gait  Follow up - - - - Falls evaluation completed   Is the patient's home free of loose throw rugs in walkways, pet beds, electrical cords, etc?   yes      Grab bars in the bathroom? yes      Handrails on the stairs?   yes      Adequate lighting?   yes  Timed Get Up and Go performed: Completed in 9 seconds with no use of assistive devices, steady gait. No intervention needed at this time.   Depression Screen PHQ 2/9 Scores 10/22/2017 03/06/2016 11/08/2015 09/22/2015  PHQ - 2 Score  3 4 2 4   PHQ- 9 Score 6 16 10 18      Cognitive Function     6CIT Screen 10/22/2017  What Year? 0 points  What month? 0 points  What time? 0 points  Count back from 20 0 points  Months in reverse 0 points  Repeat phrase 0 points  Total Score 0    Immunization History  Administered  Date(s) Administered  . Influenza, High Dose Seasonal PF 06/30/2014, 06/20/2015, 06/21/2017  . Influenza,inj,Quad PF,6+ Mos 06/30/2013  . Influenza-Unspecified 06/30/2014  . Pneumococcal Conjugate-13 06/30/2014  . Pneumococcal Polysaccharide-23 10/22/2017  . Tdap 08/04/2014    Qualifies for Shingles Vaccine? Discussed shingrix vaccine   Screening Tests Health Maintenance  Topic Date Due  . MAMMOGRAM  06/19/2019  . COLONOSCOPY  01/15/2023  . TETANUS/TDAP  08/04/2024  . INFLUENZA VACCINE  Completed  . DEXA SCAN  Completed  . Hepatitis C Screening  Completed  . PNA vac Low Risk Adult  Completed    Cancer Screenings: Lung: Low Dose CT Chest recommended if Age 18-80 years, 30 pack-year currently smoking OR have quit w/in 15years. Patient does qualify. Will send in-basket message to Burgess Estelle, RN - Oncology Navigator. Breast:  Up to date on Mammogram? Yes  06/18/2017 Up to date of Bone Density/Dexa? Yes 07/05/2015 Colorectal: colonoscopy completed 01/14/2013  Additional Screenings:  Hepatitis B/HIV/Syphillis: not indicated Hepatitis C Screening: completed 07/05/2015     Plan:    I have personally reviewed and addressed the Medicare Annual Wellness questionnaire and have noted the following in the patient's chart:  A. Medical and social history B. Use of alcohol, tobacco or illicit drugs  C. Current medications and supplements D. Functional ability and status E.  Nutritional status F.  Physical activity G. Advance directives H. List of other physicians I.  Hospitalizations, surgeries, and ER visits in previous 12 months J.  [place] such as hearing and vision if needed,  cognitive and depression L. Referrals and appointments   In addition, I have reviewed and discussed with patient certain preventive protocols, quality metrics, and best practice recommendations. A written personalized care plan for preventive services as well as general preventive health recommendations were provided to patient.   Signed,  Tyler Aas, LPN Nurse Health Advisor   Nurse Notes:none

## 2017-10-22 NOTE — Progress Notes (Signed)
Subjective:    Patient ID: Mackenzie Ward, female    DOB: 11-12-1945, 72 y.o.   MRN: [id number]  LORRIE GARGAN is a 72 y.o. female presenting on 10/22/2017 for Annual Exam; COPD; and Nicotine Dependence   HPI   Here for Annual Physical and Lab Review  Presumed COPD / Dyspnea / Tobacco Abuse Reports for past few months has had worsening dyspnea with activity especially doing housework, sometimes she will have wheezing at times. No formal diagnosis of COPD but has had recurrent episodes of bronchitis in the past. Has required nebulizer or inhaler before, just PRN. Currently not using any of these medicines. Never established with Pulmonology, has not had PFT testing or oxygen testing.  - Still active smoker 0.75 to 1 ppd, overall chronic smoker >45 years. She was able to quit for up to 1 year in past, has tried variety of treatments in past with mixed results - Describes chronic sinus drainage and mucus symptoms, states mucus is clear but seems to still be productive, uncertain duration - History of asthma as child - Understands she needs to quit smoking  Lifestyle / Weight Loss, Intentional Reduced portion size and improved diet for weight loss, about 12 lbs in 4-6 months. She states she feels good and is not trying to lose more weight but maintain - Admits feels some reduced energy, attributes to age, asking about lab results, see note on TSH, Vitamin B12, Vitamin D see below  HYPERLIPIDEMIA: - Reports no new concerns. Last lipid panel 10/2017, abnormal - Not on Statin therapy  Vitamin D Deficiency Improved from prior result 11.5 up to 26 with treatment. Still taking Vitamin D3 supplement daily for maintenance.  Bipolar Type 1 Mood Disorder, mixed symptoms Followed by Dr Gretel Acre [facility] last seen 09/30/17, has been doing well on lithium, seems to have more regulated mood, more calm and alert, overall mood and sleep and energy improved some. Not taking Hydroxyzine PRN  sleep, rarely using. - Continues on Seroquel 25mg  bedtime, Effexor 75mg  AM, Lithium 150mg  BID - See PHQ score improved  Health Maintenance: Patient has already seen [facility] LPN today for Annual Medicare Wellness  Due for 2nd pneumonia vaccine >1 year after previous vaccine, now to receive Pneumovax-23 today   Depression screen [facility] 2/9 10/22/2017 03/06/2016 11/08/2015  Decreased Interest 3 3 1   Down, Depressed, Hopeless 0 1 1  PHQ - 2 Score 3 4 2   Altered sleeping 0 3 1  Tired, decreased energy 3 3 2   Change in appetite 0 1 1  Feeling bad or failure about yourself  0 2 1  Trouble concentrating 0 3 3  Moving slowly or fidgety/restless 0 0 0  Suicidal thoughts 0 0 0  PHQ-9 Score 6 16 10   Difficult doing work/chores Not difficult at all Somewhat difficult Somewhat difficult   No flowsheet data found.   Past Medical History:  Diagnosis Date  . Depression   . High cholesterol    Past Surgical History:  Procedure Laterality Date  . BREAST SURGERY Right 2013  . COLONOSCOPY  15 years ago  . EYE SURGERY    . TONSILLECTOMY     Social History   Socioeconomic History  . Marital status: Married    Spouse name: Not on file  . Number of children: Not on file  . Years of education: Not on file  . Highest education level: Not on file  Social Needs  . Financial resource strain: Not hard at all  .  Food insecurity - worry: Never true  . Food insecurity - inability: Never true  . Transportation needs - medical: No  . Transportation needs - non-medical: No  Occupational History  . Not on file  Tobacco Use  . Smoking status: Current Every Day Smoker    Packs/day: 1.00    Years: 45.00    Pack years: 45.00    Types: Cigarettes    Last attempt to quit: 11/21/2015    Years since quitting: 1.9  . Smokeless tobacco: Never Used  Substance and Sexual Activity  . Alcohol use: No    Alcohol/week: 0.0 oz  . Drug use: No  . Sexual activity: Not Currently  Other Topics Concern  .  Not on file  Social History Narrative  . Not on file   Family History  Problem Relation Age of Onset  . Lymphoma Mother        Non Hodgkin  . Dementia Mother   . Alzheimer's disease Mother   . Lymphoma Sister        Non Hodgkin  . Depression Sister   . Heart attack Father   . Alcohol abuse Father    Current Outpatient Medications on File Prior to Visit  Medication Sig  . hydrOXYzine (ATARAX/VISTARIL) 10 MG tablet Take 1 tablet (10 mg total) 3 (three) times daily as needed by mouth.  . lithium carbonate 150 MG capsule Take 1 capsule (150 mg total) by mouth 2 (two) times daily with a meal.  . loratadine (CLARITIN) 10 MG tablet Take 1 tablet (10 mg total) by mouth daily.  . QUEtiapine (SEROQUEL) 25 MG tablet Take 1 tablet (25 mg total) by mouth at bedtime.  Marland Kitchen venlafaxine XR (EFFEXOR XR) 75 MG 24 hr capsule Take 1 capsule (75 mg total) by mouth daily with breakfast.   No current facility-administered medications on file prior to visit.     Review of Systems  Constitutional: Negative for activity change, appetite change, chills, diaphoresis, fatigue and fever.  HENT: Positive for congestion. Negative for hearing loss and sinus pressure.   Eyes: Negative for visual disturbance.  Respiratory: Positive for shortness of breath and wheezing. Negative for apnea, cough and chest tightness.   Cardiovascular: Negative for chest pain, palpitations and leg swelling.  Gastrointestinal: Negative for abdominal pain, anal bleeding, blood in stool, constipation, diarrhea, nausea and vomiting.  Endocrine: Negative for cold intolerance.  Genitourinary: Negative for difficulty urinating, dysuria, frequency, hematuria and urgency.  Musculoskeletal: Negative for arthralgias, back pain and neck pain.  Skin: Negative for rash.  Allergic/Immunologic: Negative for environmental allergies.  Neurological: Negative for dizziness, weakness, light-headedness, numbness and headaches.  Hematological: Negative for  adenopathy.  Psychiatric/Behavioral: Negative for behavioral problems, dysphoric mood and sleep disturbance. The patient is not nervous/anxious.    Per HPI unless specifically indicated above     Objective:    BP 122/70   Pulse 62   Temp 97.7 F (36.5 C) (Other (Comment))   Resp 16   Ht 4\' 11"  (1.499 m)   Wt 122 lb 3.2 oz (55.4 kg)   BMI 24.68 kg/m   Wt Readings from Last 3 Encounters:  10/22/17 122 lb 3.2 oz (55.4 kg)  10/22/17 122 lb 3.2 oz (55.4 kg)  09/30/17 125 lb 12.8 oz (57.1 kg)    Physical Exam  Constitutional: She is oriented to person, place, and time. She appears well-developed and well-nourished. No distress.  Well-appearing, comfortable, cooperative  HENT:  Head: Normocephalic and atraumatic.  Frontal / maxillary sinuses  non-tender. Nares patent without purulence or edema. R TM obscured by dry cerumen. L TM clear without erythema, effusion or bulging. Oropharynx dry mucus mem and tongue with some posterior nasal drainage without erythema, exudates, edema or asymmetry.  Eyes: Conjunctivae and EOM are normal. Pupils are equal, round, and reactive to light. Right eye exhibits no discharge. Left eye exhibits no discharge.  Neck: Normal range of motion. Neck supple. No thyromegaly present.  Cardiovascular: Normal rate, regular rhythm, normal heart sounds and intact distal pulses.  No murmur heard. Pulmonary/Chest: Effort normal. No respiratory distress. She has wheezes (scattered bilateral end exp wheezes). She has no rales.  No increased respiratory effort. Some slight reduced air movement diffusely. Speaks full sentences. No coughing.  Abdominal: Soft. Bowel sounds are normal. She exhibits no distension and no mass. There is no tenderness.  Musculoskeletal: Normal range of motion. She exhibits no edema or tenderness.  Upper / Lower Extremities: - Normal muscle tone, strength bilateral upper extremities 5/5, lower extremities 5/5  Lymphadenopathy:    She has no  cervical adenopathy.  Neurological: She is alert and oriented to person, place, and time.  Distal sensation intact to light touch all extremities  Skin: Skin is warm and dry. No rash noted. She is not diaphoretic. No erythema.  Psychiatric: She has a normal mood and affect. Her behavior is normal.  Well groomed, good eye contact, normal speech and thoughts  Nursing note and vitals reviewed.  Results for orders placed or performed in visit on 10/15/17  Comprehensive Metabolic Panel (CMET)  Result Value Ref Range   Glucose, Bld 86 65 - 99 mg/dL   BUN 8 7 - 25 mg/dL   Creat 0.71 0.60 - 0.93 mg/dL   BUN/Creatinine Ratio NOT APPLICABLE 6 - 22 (calc)   Sodium 136 135 - 146 mmol/L   Potassium 4.5 3.5 - 5.3 mmol/L   Chloride 106 98 - 110 mmol/L   CO2 24 20 - 32 mmol/L   Calcium 9.1 8.6 - 10.4 mg/dL   Total Protein 6.3 6.1 - 8.1 g/dL   Albumin 3.8 3.6 - 5.1 g/dL   Globulin 2.5 1.9 - 3.7 g/dL (calc)   AG Ratio 1.5 1.0 - 2.5 (calc)   Total Bilirubin 0.3 0.2 - 1.2 mg/dL   Alkaline phosphatase (APISO) 114 33 - 130 U/L   AST 19 10 - 35 U/L   ALT 17 6 - 29 U/L  Lipid Profile  Result Value Ref Range   Cholesterol 225 (H) <200 mg/dL   HDL 51 >50 mg/dL   Triglycerides 154 (H) <150 mg/dL   LDL Cholesterol (Calc) 145 (H) mg/dL (calc)   Total CHOL/HDL Ratio 4.4 <5.0 (calc)   Non-HDL Cholesterol (Calc) 174 (H) <130 mg/dL (calc)  CBC with Differential  Result Value Ref Range   WBC 7.9 3.8 - 10.8 Thousand/uL   RBC 5.50 (H) 3.80 - 5.10 Million/uL   Hemoglobin 15.5 11.7 - 15.5 g/dL   HCT 47.2 (H) 35.0 - 45.0 %   MCV 85.8 80.0 - 100.0 fL   MCH 28.2 27.0 - 33.0 pg   MCHC 32.8 32.0 - 36.0 g/dL   RDW 13.8 11.0 - 15.0 %   Platelets 363 140 - 400 Thousand/uL   MPV 10.9 7.5 - 12.5 fL   Neutro Abs 4,345 1,500 - 7,800 cells/uL   Lymphs Abs 2,433 850 - 3,900 cells/uL   WBC mixed population 1,027 (H) 200 - 950 cells/uL   Eosinophils Absolute 47 15 - 500 cells/uL  Basophils Absolute 47 0 - 200  cells/uL   Neutrophils Relative % 55 %   Total Lymphocyte 30.8 %   Monocytes Relative 13.0 %   Eosinophils Relative 0.6 %   Basophils Relative 0.6 %  HgB A1c  Result Value Ref Range   Hgb A1c MFr Bld 5.5 <5.7 % of total Hgb   Mean Plasma Glucose 111 (calc)   eAG (mmol/L) 6.2 (calc)  Vitamin D (25 hydroxy)  Result Value Ref Range   Vit D, 25-Hydroxy 26 (L) 30 - 100 ng/mL  TSH  Result Value Ref Range   TSH 3.37 0.40 - 4.50 mIU/L  B12  Result Value Ref Range   Vitamin B-12 457 200 - 1,100 pg/mL      Assessment & Plan:   Problem List Items Addressed This Visit    Anxiety and depression    Related to bipolar mood disorder See A&P      Bipolar 1 disorder (HCC)    Stable chronic problem Followed by Dr Gretel Acre, [facility] PHQ improved now in interval - Continues on current medications now without change - Seroquel 25mg  bedtime, Effexor 75mg  AM, Lithium 150mg  BID - Suspect some fatigue or energy may be possible med side effect      Chronic fatigue    Suspected multifactorial No clear cause with labs, Vit D still mild low but improved Not thyroid or vitamin B12 Likely related to Psychiatric diagnosis with Bipolar Mood Disorder on multiple medications can cause sedation, seemed to have improved energy now that this is better controlled      Chronic obstructive pulmonary disease (HCC)    Presumed mild COPD based on clinical diagnosis with inc symptoms and some dyspnea / sputum production gradual chronic changes, in long-term smoker >45 years Suspect this is one of primary etiologies of her fatigue No formal PFT or Pulm eval in past Only on temporary rescue inhaler/neb, not on maintenance inhalers Clinically well today but has persistent wheezing, reduced air movement. Not consistent with acute exac  Plan Discussion on COPD as disease process pathogenesis, treatment options, sequela and complications - advised I would recommend in future if not improving may need formal Pulm eval and  PFTs, and oxygen sat testing ambulation 6 min walk and overnight - may be part of her fatigue as well  Start Spiriva 25mcg daily for maintenance anticholinergic - sent to pharmacy, may add ICS in future or dual therapy agent instead      Relevant Medications   tiotropium (SPIRIVA HANDIHALER) 18 MCG inhalation capsule   Mixed hyperlipidemia    Uncontrolled cholesterol, still no longer on statin, limited lifestyle now improving gradually Last lipid panel 10/2017 Elevated ASCVD risk score  Plan: 1. Counseling on reducing ASCVD risk - may consider low dose other statin rosuvastatin intermittent titrate up dosing, and or start ASA 81mg  for primary ASCVD risk reduction - she declines intervention today 3. Encourage improved lifestyle - low carb/cholesterol, reduce portion size, start regular exercise 4. Follow-up lipids yearly      Tobacco abuse    Active smoker, not quite ready to quit Has had some success in past 1 yr, familiar with quitline Has some sequela of chronic smoking with sputum production etc No formal dx COPD, see A&P - started Spiriva Recommend Low Dose Lung CA CT scan - through [facility], handout and info given, this was initially set up by Tyler Aas LPN from medicare wellness visit today, they should contact patient with apt, she is hesitant and may  not schedule      Vitamin D deficiency    Improved Vitamin D deficiency. Could have contributed to energy, suspect other factors more related Now Vit D up from 11 to 24 Recommend continue Vitamin D3 2,000 iu daily maintenance       Other Visit Diagnoses    Annual physical exam    -  Primary UTD Health Maintenance, vaccines and screening Recommend Low Dose Lung CA CT Screening scan - info given, to be arranged Smoking cessation counseling Encourage improve diet and exercise lifestyle, maintain healthy BMI >24    Dyspnea on exertion     See A&P suspect secondary to COPD    Relevant Medications   tiotropium (SPIRIVA  HANDIHALER) 18 MCG inhalation capsule      Meds ordered this encounter  Medications  . tiotropium (SPIRIVA HANDIHALER) 18 MCG inhalation capsule    Sig: Place 1 capsule (18 mcg total) into inhaler and inhale daily.    Dispense:  30 capsule    Refill:  12     Follow up plan: Return in about 3 months (around 01/19/2018) for Breathing/COPD med adjust / CT result.  Nobie Putnam, [facility] 10/22/2017, 6:00 PM

## 2017-10-22 NOTE — Assessment & Plan Note (Signed)
Stable chronic problem Followed by Dr Gretel Acre, [facility] PHQ improved now in interval - Continues on current medications now without change - Seroquel 25mg  bedtime, Effexor 75mg  AM, Lithium 150mg  BID - Suspect some fatigue or energy may be possible med side effect

## 2017-10-22 NOTE — Assessment & Plan Note (Signed)
Presumed mild COPD based on clinical diagnosis with inc symptoms and some dyspnea / sputum production gradual chronic changes, in long-term smoker >45 years Suspect this is one of primary etiologies of her fatigue No formal PFT or Pulm eval in past Only on temporary rescue inhaler/neb, not on maintenance inhalers Clinically well today but has persistent wheezing, reduced air movement. Not consistent with acute exac  Plan Discussion on COPD as disease process pathogenesis, treatment options, sequela and complications - advised I would recommend in future if not improving may need formal Pulm eval and PFTs, and oxygen sat testing ambulation 6 min walk and overnight - may be part of her fatigue as well  Start Spiriva 63mcg daily for maintenance anticholinergic - sent to pharmacy, may add ICS in future or dual therapy agent instead

## 2017-10-22 NOTE — Patient Instructions (Addendum)
Mackenzie Ward , Thank you for taking time to come for your Medicare Wellness Visit. I appreciate your ongoing commitment to your health goals. Please review the following plan we discussed and let me know if I can assist you in the future.   Screening recommendations/referrals: Colonoscopy: completed 01/14/2013 Mammogram: completed 06/18/2017 Bone Density: completed 07/05/2015 Recommended yearly ophthalmology/optometry visit for glaucoma screening and checkup Recommended yearly dental visit for hygiene and checkup  Vaccinations: Influenza vaccine: up to date Pneumococcal vaccine: pneumovax 23 done today Tdap vaccine: up to date Shingles vaccine: due, check with your insurance company for coverage     Advanced directives: Advance directive discussed with you today. I have provided a copy for you to complete at home and have notarized. Once this is complete please bring a copy in to our office so we can scan it into your chart.  Conditions/risks identified: smoking cessation discussed.  Next appointment: Follow up in one year for your annual wellness exam.    Preventive Care 65 Years and Older, Female Preventive care refers to lifestyle choices and visits with your health care provider that can promote health and wellness. What does preventive care include?  A yearly physical exam. This is also called an annual well check.  Dental exams once or twice a year.  Routine eye exams. Ask your health care provider how often you should have your eyes checked.  Personal lifestyle choices, including:  Daily care of your teeth and gums.  Regular physical activity.  Eating a healthy diet.  Avoiding tobacco and drug use.  Limiting alcohol use.  Practicing safe sex.  Taking low-dose aspirin every day.  Taking vitamin and mineral supplements as recommended by your health care provider. What happens during an annual well check? The services and screenings done by your health care provider  during your annual well check will depend on your age, overall health, lifestyle risk factors, and family history of disease. Counseling  Your health care provider may ask you questions about your:  Alcohol use.  Tobacco use.  Drug use.  Emotional well-being.  Home and relationship well-being.  Sexual activity.  Eating habits.  History of falls.  Memory and ability to understand (cognition).  Work and work Statistician.  Reproductive health. Screening  You may have the following tests or measurements:  Height, weight, and BMI.  Blood pressure.  Lipid and cholesterol levels. These may be checked every 5 years, or more frequently if you are over 1 years old.  Skin check.  Lung cancer screening. You may have this screening every year starting at age 1 if you have a 30-pack-year history of smoking and currently smoke or have quit within the past 15 years.  Fecal occult blood test (FOBT) of the stool. You may have this test every year starting at age 67.  Flexible sigmoidoscopy or colonoscopy. You may have a sigmoidoscopy every 5 years or a colonoscopy every 10 years starting at age 26.  Hepatitis C blood test.  Hepatitis B blood test.  Sexually transmitted disease (STD) testing.  Diabetes screening. This is done by checking your blood sugar (glucose) after you have not eaten for a while (fasting). You may have this done every 1-3 years.  Bone density scan. This is done to screen for osteoporosis. You may have this done starting at age 76.  Mammogram. This may be done every 1-2 years. Talk to your health care provider about how often you should have regular mammograms. Talk with your health care provider  about your test results, treatment options, and if necessary, the need for more tests. Vaccines  Your health care provider may recommend certain vaccines, such as:  Influenza vaccine. This is recommended every year.  Tetanus, diphtheria, and acellular pertussis  (Tdap, Td) vaccine. You may need a Td booster every 10 years.  Zoster vaccine. You may need this after age 56.  Pneumococcal 13-valent conjugate (PCV13) vaccine. One dose is recommended after age 40.  Pneumococcal polysaccharide (PPSV23) vaccine. One dose is recommended after age 103. Talk to your health care provider about which screenings and vaccines you need and how often you need them. This information is not intended to replace advice given to you by your health care provider. Make sure you discuss any questions you have with your health care provider. Document Released: 09/23/2015 Document Revised: 05/16/2016 Document Reviewed: 06/28/2015 Elsevier Interactive Patient Education  2017 [place] Prevention in the Home Falls can cause injuries. They can happen to people of all ages. There are many things you can do to make your home safe and to help prevent falls. What can I do on the outside of my home?  Regularly fix the edges of walkways and driveways and fix any cracks.  Remove anything that might make you trip as you walk through a door, such as a raised step or threshold.  Trim any bushes or trees on the path to your home.  Use bright outdoor lighting.  Clear any walking paths of anything that might make someone trip, such as rocks or tools.  Regularly check to see if handrails are loose or broken. Make sure that both sides of any steps have handrails.  Any raised decks and porches should have guardrails on the edges.  Have any leaves, snow, or ice cleared regularly.  Use sand or salt on walking paths during winter.  Clean up any spills in your garage right away. This includes oil or grease spills. What can I do in the bathroom?  Use night lights.  Install grab bars by the toilet and in the tub and shower. Do not use towel bars as grab bars.  Use non-skid mats or decals in the tub or shower.  If you need to sit down in the shower, use a plastic, non-slip  stool.  Keep the floor dry. Clean up any water that spills on the floor as soon as it happens.  Remove soap buildup in the tub or shower regularly.  Attach bath mats securely with double-sided non-slip rug tape.  Do not have throw rugs and other things on the floor that can make you trip. What can I do in the bedroom?  Use night lights.  Make sure that you have a light by your bed that is easy to reach.  Do not use any sheets or blankets that are too big for your bed. They should not hang down onto the floor.  Have a firm chair that has side arms. You can use this for support while you get dressed.  Do not have throw rugs and other things on the floor that can make you trip. What can I do in the kitchen?  Clean up any spills right away.  Avoid walking on wet floors.  Keep items that you use a lot in easy-to-reach places.  If you need to reach something above you, use a strong step stool that has a grab bar.  Keep electrical cords out of the way.  Do not use floor polish or  wax that makes floors slippery. If you must use wax, use non-skid floor wax.  Do not have throw rugs and other things on the floor that can make you trip. What can I do with my stairs?  Do not leave any items on the stairs.  Make sure that there are handrails on both sides of the stairs and use them. Fix handrails that are broken or loose. Make sure that handrails are as long as the stairways.  Check any carpeting to make sure that it is firmly attached to the stairs. Fix any carpet that is loose or worn.  Avoid having throw rugs at the top or bottom of the stairs. If you do have throw rugs, attach them to the floor with carpet tape.  Make sure that you have a light switch at the top of the stairs and the bottom of the stairs. If you do not have them, ask someone to add them for you. What else can I do to help prevent falls?  Wear shoes that:  Do not have high heels.  Have rubber bottoms.  Are  comfortable and fit you well.  Are closed at the toe. Do not wear sandals.  If you use a stepladder:  Make sure that it is fully opened. Do not climb a closed stepladder.  Make sure that both sides of the stepladder are locked into place.  Ask someone to hold it for you, if possible.  Clearly mark and make sure that you can see:  Any grab bars or handrails.  First and last steps.  Where the edge of each step is.  Use tools that help you move around (mobility aids) if they are needed. These include:  Canes.  Walkers.  Scooters.  Crutches.  Turn on the lights when you go into a dark area. Replace any light bulbs as soon as they burn out.  Set up your furniture so you have a clear path. Avoid moving your furniture around.  If any of your floors are uneven, fix them.  If there are any pets around you, be aware of where they are.  Review your medicines with your doctor. Some medicines can make you feel dizzy. This can increase your chance of falling. Ask your doctor what other things that you can do to help prevent falls. This information is not intended to replace advice given to you by your health care provider. Make sure you discuss any questions you have with your health care provider. Document Released: 06/23/2009 Document Revised: 02/02/2016 Document Reviewed: 10/01/2014 Elsevier Interactive Patient Education  2017 [employer].  Steps to Quit Smoking Smoking tobacco can be bad for your health. It can also affect almost every organ in your body. Smoking puts you and people around you at risk for many serious long-lasting (chronic) diseases. Quitting smoking is hard, but it is one of the best things that you can do for your health. It is never too late to quit. What are the benefits of quitting smoking? When you quit smoking, you lower your risk for getting serious diseases and conditions. They can include:  Lung cancer or lung disease.  Heart  disease.  Stroke.  Heart attack.  Not being able to have children (infertility).  Weak bones (osteoporosis) and broken bones (fractures).  If you have coughing, wheezing, and shortness of breath, those symptoms may get better when you quit. You may also get sick less often. If you are pregnant, quitting smoking can help to lower your chances  of having a baby of low birth weight. What can I do to help me quit smoking? Talk with your doctor about what can help you quit smoking. Some things you can do (strategies) include:  Quitting smoking totally, instead of slowly cutting back how much you smoke over a period of time.  Going to in-person counseling. You are more likely to quit if you go to many counseling sessions.  Using resources and support systems, such as: ? Database administrator with a Social worker. ? Phone quitlines. ? Careers information officer. ? Support groups or group counseling. ? Text messaging programs. ? Mobile phone apps or applications.  Taking medicines. Some of these medicines may have nicotine in them. If you are pregnant or breastfeeding, do not take any medicines to quit smoking unless your doctor says it is okay. Talk with your doctor about counseling or other things that can help you.  Talk with your doctor about using more than one strategy at the same time, such as taking medicines while you are also going to in-person counseling. This can help make quitting easier. What things can I do to make it easier to quit? Quitting smoking might feel very hard at first, but there is a lot that you can do to make it easier. Take these steps:  Talk to your family and friends. Ask them to support and encourage you.  Call phone quitlines, reach out to support groups, or work with a Social worker.  Ask people who smoke to not smoke around you.  Avoid places that make you want (trigger) to smoke, such as: ? Bars. ? Parties. ? Smoke-break areas at work.  Spend time with people  who do not smoke.  Lower the stress in your life. Stress can make you want to smoke. Try these things to help your stress: ? Getting regular exercise. ? Deep-breathing exercises. ? Yoga. ? Meditating. ? Doing a body scan. To do this, close your eyes, focus on one area of your body at a time from head to toe, and notice which parts of your body are tense. Try to relax the muscles in those areas.  Download or buy apps on your mobile phone or tablet that can help you stick to your quit plan. There are many free apps, such as QuitGuide from the [employer] Office manager for Disease Control and Prevention). You can find more support from smokefree.gov and other websites.  This information is not intended to replace advice given to you by your health care provider. Make sure you discuss any questions you have with your health care provider. Document Released: 06/23/2009 Document Revised: 04/24/2016 Document Reviewed: 01/11/2015 Elsevier Interactive Patient Education  2018 [employer].

## 2017-10-22 NOTE — Assessment & Plan Note (Signed)
Uncontrolled cholesterol, still no longer on statin, limited lifestyle now improving gradually Last lipid panel 10/2017 Elevated ASCVD risk score  Plan: 1. Counseling on reducing ASCVD risk - may consider low dose other statin rosuvastatin intermittent titrate up dosing, and or start ASA 81mg  for primary ASCVD risk reduction - she declines intervention today 3. Encourage improved lifestyle - low carb/cholesterol, reduce portion size, start regular exercise 4. Follow-up lipids yearly

## 2017-11-12 ENCOUNTER — Inpatient Hospital Stay: Payer: Medicare Other | Attending: Oncology | Admitting: Oncology

## 2017-11-12 ENCOUNTER — Ambulatory Visit
Admission: RE | Admit: 2017-11-12 | Discharge: 2017-11-12 | Disposition: A | Discharge status: Home / Self Care | Payer: Medicare Other | Source: Ambulatory Visit | Attending: Oncology | Admitting: Oncology

## 2017-11-12 DIAGNOSIS — Z87891 Personal history of nicotine dependence: Secondary | ICD-10-CM | POA: Diagnosis present

## 2017-11-12 DIAGNOSIS — I2510 Atherosclerotic heart disease of native coronary artery without angina pectoris: Secondary | ICD-10-CM | POA: Diagnosis not present

## 2017-11-12 DIAGNOSIS — I700 Atherosclerosis of aorta: Secondary | ICD-10-CM | POA: Insufficient documentation

## 2017-11-12 DIAGNOSIS — Z122 Encounter for screening for malignant neoplasm of respiratory organs: Secondary | ICD-10-CM

## 2017-11-12 NOTE — Progress Notes (Signed)
In accordance with CMS guidelines, patient has met eligibility criteria including age, absence of signs or symptoms of lung cancer.  Social History   Tobacco Use  . Smoking status: Current Every Day Smoker    Packs/day: 1.00    Years: 45.00    Pack years: 45.00    Types: Cigarettes    Last attempt to quit: 11/21/2015    Years since quitting: 1.9  . Smokeless tobacco: Never Used  Substance Use Topics  . Alcohol use: No    Alcohol/week: 0.0 oz  . Drug use: No     A shared decision-making session was conducted prior to the performance of CT scan. This includes one or more decision aids, includes benefits and harms of screening, follow-up diagnostic testing, over-diagnosis, false positive rate, and total radiation exposure.  Counseling on the importance of adherence to annual lung cancer LDCT screening, impact of co-morbidities, and ability or willingness to undergo diagnosis and treatment is imperative for compliance of the program.  Counseling on the importance of continued smoking cessation for former smokers; the importance of smoking cessation for current smokers, and information about tobacco cessation interventions have been given to patient including [place] and [facility] quit [place] programs.  Written order for lung cancer screening with LDCT has been given to the patient and any and all questions have been answered to the best of my abilities.   Yearly follow up will be coordinated by Burgess Estelle, Thoracic Navigator.  Faythe Casa, NP 11/12/2017 11:36 AM

## 2017-11-14 ENCOUNTER — Telehealth: Payer: Self-pay | Admitting: *Deleted

## 2017-11-14 NOTE — Telephone Encounter (Signed)
Notified patient of LDCT lung cancer screening program results with recommendation for 12 month follow up imaging. Also notified of incidental findings noted below and is encouraged to discuss further with PCP who will receive a copy of this note and/or the CT report. Patient verbalizes understanding.   IMPRESSION: 1. Lung-RADS 2, benign appearance or behavior. Continue annual screening with low-dose chest CT without contrast in 12 months. 2. Aortic atherosclerosis (ICD10-170.0). Three-vessel coronary artery calcification. 3. Suspect mild emphysema (ICD10-J43.9).

## 2017-11-25 ENCOUNTER — Other Ambulatory Visit: Payer: Self-pay

## 2017-11-25 ENCOUNTER — Encounter: Payer: Self-pay | Admitting: Psychiatry

## 2017-11-25 ENCOUNTER — Ambulatory Visit: Payer: Medicare Other | Admitting: Psychiatry

## 2017-11-25 VITALS — BP 123/78 | HR 90 | Temp 97.7°F | Wt 129.4 lb

## 2017-11-25 DIAGNOSIS — F3160 Bipolar disorder, current episode mixed, unspecified: Secondary | ICD-10-CM | POA: Diagnosis not present

## 2017-11-25 DIAGNOSIS — F413 Other mixed anxiety disorders: Secondary | ICD-10-CM | POA: Diagnosis not present

## 2017-11-25 MED ORDER — QUETIAPINE FUMARATE 25 MG PO TABS: 25.0000 mg | ORAL_TABLET | Freq: Every day | ORAL | 2 refills | Status: DC

## 2017-11-25 MED ORDER — VENLAFAXINE HCL ER 75 MG PO CP24: 75.0000 mg | ORAL_CAPSULE | Freq: Every day | ORAL | 1 refills | Status: DC

## 2017-11-25 MED ORDER — LITHIUM CARBONATE 150 MG PO CAPS: 150.0000 mg | ORAL_CAPSULE | Freq: Two times a day (BID) | ORAL | 1 refills | Status: DC
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
## 2017-12-03 ENCOUNTER — Other Ambulatory Visit: Payer: Self-pay | Admitting: Psychiatry

## 2017-12-06 NOTE — Telephone Encounter (Signed)
received fax requesting a refill on the hydroxyzine 10mg  pt was last seen on  11-25-17 next appt  02-24-18

## 2017-12-16 ENCOUNTER — Other Ambulatory Visit: Payer: Self-pay | Admitting: Psychiatry

## 2017-12-16 MED ORDER — HYDROXYZINE HCL 10 MG PO TABS: 10.0000 mg | ORAL_TABLET | Freq: Three times a day (TID) | ORAL | 2 refills | Status: DC | PRN

## 2018-01-27 ENCOUNTER — Ambulatory Visit: Payer: Medicare Other | Admitting: Family Medicine

## 2018-01-27 ENCOUNTER — Encounter: Payer: Self-pay | Admitting: Family Medicine

## 2018-01-27 VITALS — BP 116/63 | HR 71 | Temp 98.4°F | Resp 16 | Ht 59.0 in | Wt 127.0 lb

## 2018-01-27 DIAGNOSIS — Z720 Tobacco use: Secondary | ICD-10-CM

## 2018-01-27 DIAGNOSIS — J432 Centrilobular emphysema: Secondary | ICD-10-CM

## 2018-01-27 NOTE — Progress Notes (Signed)
Subjective:    Patient ID: Mackenzie Ward, female    DOB: 05/09/1946, 72 y.o.   MRN: [id number]  Mackenzie Ward is a 72 y.o. female presenting on 01/27/2018 for COPD (improved)   HPI   FOLLOW-UP COPD (Mild Emphysema) / Dyspnea / Tobacco Abuse - Last visit with me 10/2017, for for annual physical discussed same problem with dyspnea especially with exertion, treated with new start Spiriva 29mcg daily for presumed COPD, see prior notes for background information. - Interval update with Low Dose Lung CA Screen Chest CT done 11/2017, negative for malignancy or abnormal lesions, but confirmed mild emphysema of lungs - Today patient reports significant improvement on Spiriva now daily, she started to get improvement almost immediately few months ago when started it, and now less dyspnea with regular activities. - She has never seen Pulmonology or had PFT, and she is not interested at this time. Admits occasionally if overexertion she can feel winded, but this is much more rare, she attributes some of this to her age now 68 she states Denies any worsening dyspnea, chest pain or tightness, productive cough, wheezing  Tobacco Abuse - Still active smoker 0.75 to 1 ppd, overall chronic smoker >45 years. She was able to quit for up to 1 year in past, has tried variety of treatments in past with mixed results - She denies any interest in quitting smoking at this time   Depression screen [facility] 2/9 10/22/2017 03/06/2016 11/08/2015  Decreased Interest 3 3 1   Down, Depressed, Hopeless 0 1 1  PHQ - 2 Score 3 4 2   Altered sleeping 0 3 1  Tired, decreased energy 3 3 2   Change in appetite 0 1 1  Feeling bad or failure about yourself  0 2 1  Trouble concentrating 0 3 3  Moving slowly or fidgety/restless 0 0 0  Suicidal thoughts 0 0 0  PHQ-9 Score 6 16 10   Difficult doing work/chores Not difficult at all Somewhat difficult Somewhat difficult    Social History   Tobacco Use  . Smoking status: Current  Every Day Smoker    Packs/day: 1.00    Years: 45.00    Pack years: 45.00    Types: Cigarettes    Last attempt to quit: 11/21/2015    Years since quitting: 2.1  . Smokeless tobacco: Never Used  Substance Use Topics  . Alcohol use: No    Alcohol/week: 0.0 oz  . Drug use: No    Review of Systems Per HPI unless specifically indicated above     Objective:    BP 116/63   Pulse 71   Temp 98.4 F (36.9 C) (Oral)   Resp 16   Ht 4\' 11"  (1.499 m)   Wt 127 lb (57.6 kg)   SpO2 100%   BMI 25.65 kg/m   Wt Readings from Last 3 Encounters:  01/27/18 127 lb (57.6 kg)  11/12/17 125 lb (56.7 kg)  10/22/17 122 lb 3.2 oz (55.4 kg)    Physical Exam  Constitutional: She is oriented to person, place, and time. She appears well-developed and well-nourished. No distress.  Well-appearing, comfortable, cooperative  HENT:  Head: Normocephalic and atraumatic.  Mouth/Throat: Oropharynx is clear and moist.  Eyes: Conjunctivae are normal. Right eye exhibits no discharge. Left eye exhibits no discharge.  Neck: Normal range of motion. Neck supple.  Cardiovascular: Normal rate, regular rhythm, normal heart sounds and intact distal pulses.  No murmur heard. Pulmonary/Chest: Effort normal and breath sounds normal. No respiratory  distress. She has no wheezes. She has no rales.  Significant improvement in air movement. Resolved wheezing.  Musculoskeletal: Normal range of motion. She exhibits no edema.  Neurological: She is alert and oriented to person, place, and time.  Skin: Skin is warm and dry. No rash noted. She is not diaphoretic. No erythema.  Psychiatric: She has a normal mood and affect. Her behavior is normal.  Well groomed, good eye contact, normal speech and thoughts  Nursing note and vitals reviewed.    I have personally reviewed the radiology report from 11/12/17 Chest CT Lung CA Screening.  CLINICAL DATA:  Current smoker, 45 pack-year history, lung cancer screening.  EXAM: CT CHEST  WITHOUT CONTRAST LOW-DOSE FOR LUNG CANCER SCREENING  TECHNIQUE: Multidetector CT imaging of the chest was performed following the standard protocol without IV contrast.  COMPARISON:  None.  FINDINGS: Cardiovascular: Atherosclerotic calcification of the arterial vasculature, including three-vessel involvement of the coronary arteries. Heart size normal. No pericardial effusion.  Mediastinum/Nodes: No pathologically enlarged mediastinal or axillary lymph nodes. Hilar regions are difficult to evaluate without IV contrast. Esophagus is grossly unremarkable.  Lungs/Pleura: Mild biapical pleuroparenchymal scarring. Ill-defined centrilobular nodularity, indicative of respiratory bronchiolitis in this patient who is a current smoker. Probable mild centrilobular emphysema. 2.4 mm nodule in the peripheral right upper lobe. No worrisome pulmonary nodules. No pleural fluid. Adherent debris in the trachea.  Upper Abdomen: Visualized portions of the liver, gallbladder, adrenal glands, kidneys, spleen, pancreas, stomach and bowel are grossly unremarkable. No upper abdominal adenopathy.  Musculoskeletal: Degenerative changes in the spine. No worrisome lytic or sclerotic lesions.  IMPRESSION: 1. Lung-RADS 2, benign appearance or behavior. Continue annual screening with low-dose chest CT without contrast in 12 months. 2. Aortic atherosclerosis (ICD10-170.0). Three-vessel coronary artery calcification. 3. Suspect mild emphysema (ICD10-J43.9).   Electronically Signed   By: Lorin Picket M.D.   On: 11/13/2017 09:24  Results for orders placed or performed in visit on 10/15/17  Comprehensive Metabolic Panel (CMET)  Result Value Ref Range   Glucose, Bld 86 65 - 99 mg/dL   BUN 8 7 - 25 mg/dL   Creat 0.71 0.60 - 0.93 mg/dL   BUN/Creatinine Ratio NOT APPLICABLE 6 - 22 (calc)   Sodium 136 135 - 146 mmol/L   Potassium 4.5 3.5 - 5.3 mmol/L   Chloride 106 98 - 110 mmol/L   CO2 24 20  - 32 mmol/L   Calcium 9.1 8.6 - 10.4 mg/dL   Total Protein 6.3 6.1 - 8.1 g/dL   Albumin 3.8 3.6 - 5.1 g/dL   Globulin 2.5 1.9 - 3.7 g/dL (calc)   AG Ratio 1.5 1.0 - 2.5 (calc)   Total Bilirubin 0.3 0.2 - 1.2 mg/dL   Alkaline phosphatase (APISO) 114 33 - 130 U/L   AST 19 10 - 35 U/L   ALT 17 6 - 29 U/L  Lipid Profile  Result Value Ref Range   Cholesterol 225 (H) <200 mg/dL   HDL 51 >50 mg/dL   Triglycerides 154 (H) <150 mg/dL   LDL Cholesterol (Calc) 145 (H) mg/dL (calc)   Total CHOL/HDL Ratio 4.4 <5.0 (calc)   Non-HDL Cholesterol (Calc) 174 (H) <130 mg/dL (calc)  CBC with Differential  Result Value Ref Range   WBC 7.9 3.8 - 10.8 Thousand/uL   RBC 5.50 (H) 3.80 - 5.10 Million/uL   Hemoglobin 15.5 11.7 - 15.5 g/dL   HCT 47.2 (H) 35.0 - 45.0 %   MCV 85.8 80.0 - 100.0 fL   MCH  28.2 27.0 - 33.0 pg   MCHC 32.8 32.0 - 36.0 g/dL   RDW 13.8 11.0 - 15.0 %   Platelets 363 140 - 400 Thousand/uL   MPV 10.9 7.5 - 12.5 fL   Neutro Abs 4,345 1,500 - 7,800 cells/uL   Lymphs Abs 2,433 850 - 3,900 cells/uL   WBC mixed population 1,027 (H) 200 - 950 cells/uL   Eosinophils Absolute 47 15 - 500 cells/uL   Basophils Absolute 47 0 - 200 cells/uL   Neutrophils Relative % 55 %   Total Lymphocyte 30.8 %   Monocytes Relative 13.0 %   Eosinophils Relative 0.6 %   Basophils Relative 0.6 %  HgB A1c  Result Value Ref Range   Hgb A1c MFr Bld 5.5 <5.7 % of total Hgb   Mean Plasma Glucose 111 (calc)   eAG (mmol/L) 6.2 (calc)  Vitamin D (25 hydroxy)  Result Value Ref Range   Vit D, 25-Hydroxy 26 (L) 30 - 100 ng/mL  TSH  Result Value Ref Range   TSH 3.37 0.40 - 4.50 mIU/L  B12  Result Value Ref Range   Vitamin B-12 457 200 - 1,100 pg/mL      Assessment & Plan:   Problem List Items Addressed This Visit    Chronic obstructive pulmonary disease ([place]) - Primary    Significantly improved COPD, mild emphysema now on Spiriva daily Improved functional respiratory status, reduced fatigue and  dyspnea No formal PFT or Pulmonology - patient declines Asymptomatic today w/o recent exacerbation Last imaging, confirm emphysema on CT Chest (11/2017 - lung CA screen)  Plan Continue Spiriva 47mcg daily inhalation - already has refills up to 1 year Offered add inhaled corticosteroid (ICS) or combination therapy - she declined now given improvement Future may consider PFTs and Pulmonology Repeat yearly Chest CT screening for now Smoking cessation Follow-up q 6 month monitoring      Tobacco abuse    Active smoker, not quite ready to quit S/p Low Dose CT Chest - 11/2017 negative, repeat yearly until age 42 Has had some success in past 1 yr, familiar with quitline Dx mild emphysema COPD, improved now on Spiriva Declined pulmonology         No orders of the defined types were placed in this encounter.   Follow up plan: Return in about 6 months (around 07/30/2018) for COPD / Smoking.  Nobie Putnam, [facility] 01/27/2018, 9:56 AM

## 2018-01-27 NOTE — Patient Instructions (Addendum)
Thank you for coming to the office today.  Continue Spiriva one puff daily for maintenance for Emphysema (COPD)  Good news on last CT Lung Scan was normal - but did confirm some mild emphysema  At this time we do not need to add a steroid inhaler or other treatment, or refer to Pulmonologist Lung Doctor - if worsening let me know and we may do this  If you are ready to quit smoking let me know.  Please schedule a Follow-up Appointment to: Return in about 6 months (around 07/30/2018) for COPD / Smoking.  If you have any other questions or concerns, please feel free to call the office or send a message through [place]. You may also schedule an earlier appointment if necessary.  Additionally, you may be receiving a survey about your experience at our office within a few days to 1 week by e-mail or mail. We value your feedback.  Nobie Putnam, DO [place]

## 2018-01-27 NOTE — Assessment & Plan Note (Signed)
Active smoker, not quite ready to quit S/p Low Dose CT Chest - 11/2017 negative, repeat yearly until age 72 Has had some success in past 1 yr, familiar with quitline Dx mild emphysema COPD, improved now on [place] pul[facility]ology

## 2018-01-27 NOTE — Assessment & Plan Note (Signed)
Significantly improved COPD, mild emphysema now on Spiriva daily Improved functional respiratory status, reduced fatigue and dyspnea No formal PFT or Pulmonology - patient declines Asymptomatic today w/o recent exacerbation Last imaging, confirm emphysema on CT Chest (11/2017 - lung CA screen)  Plan Continue Spiriva 17mcg daily inhalation - already has refills up to 1 year Offered add inhaled corticosteroid (ICS) or combination therapy - she declined now given improvement Future may consider PFTs and Pulmonology Repeat yearly Chest CT screening for now Smoking cessation Follow-up q 6 month monitoring

## 2018-02-17 ENCOUNTER — Ambulatory Visit: Payer: Medicare Other | Admitting: Psychiatry

## 2018-02-17 ENCOUNTER — Encounter: Payer: Self-pay | Admitting: Psychiatry

## 2018-02-17 ENCOUNTER — Other Ambulatory Visit: Payer: Self-pay

## 2018-02-17 VITALS — BP 123/78 | HR 96 | Temp 97.1°F | Wt 130.8 lb

## 2018-02-17 DIAGNOSIS — F413 Other mixed anxiety disorders: Secondary | ICD-10-CM | POA: Diagnosis not present

## 2018-02-17 DIAGNOSIS — F3160 Bipolar disorder, current episode mixed, unspecified: Secondary | ICD-10-CM | POA: Diagnosis not present

## 2018-02-17 MED ORDER — HYDROXYZINE HCL 10 MG PO TABS: 10.0000 mg | ORAL_TABLET | Freq: Three times a day (TID) | ORAL | 2 refills | Status: DC | PRN

## 2018-02-17 MED ORDER — QUETIAPINE FUMARATE 25 MG PO TABS: 25.0000 mg | ORAL_TABLET | Freq: Every day | ORAL | 2 refills | Status: DC

## 2018-02-17 MED ORDER — VENLAFAXINE HCL ER 75 MG PO CP24: 75.0000 mg | ORAL_CAPSULE | Freq: Every day | ORAL | 1 refills | Status: DC

## 2018-02-17 MED ORDER — LITHIUM CARBONATE 150 MG PO CAPS: 150.0000 mg | ORAL_CAPSULE | Freq: Two times a day (BID) | ORAL | 1 refills | Status: DC
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
## 2018-02-24 ENCOUNTER — Ambulatory Visit: Payer: Medicare Other | Admitting: Psychiatry

## 2018-05-19 ENCOUNTER — Encounter: Payer: Self-pay | Admitting: Psychiatry

## 2018-05-19 ENCOUNTER — Ambulatory Visit: Payer: Medicare Other | Admitting: Psychiatry

## 2018-05-19 ENCOUNTER — Other Ambulatory Visit: Payer: Self-pay

## 2018-05-19 VITALS — BP 130/79 | HR 94 | Temp 97.5°F | Wt 132.8 lb

## 2018-05-19 DIAGNOSIS — F3160 Bipolar disorder, current episode mixed, unspecified: Secondary | ICD-10-CM | POA: Diagnosis not present

## 2018-05-19 DIAGNOSIS — F413 Other mixed anxiety disorders: Secondary | ICD-10-CM

## 2018-05-19 MED ORDER — QUETIAPINE FUMARATE 25 MG PO TABS: 25.0000 mg | ORAL_TABLET | Freq: Every day | ORAL | 2 refills | Status: DC

## 2018-05-19 MED ORDER — GABAPENTIN 100 MG PO CAPS: 100.0000 mg | ORAL_CAPSULE | Freq: Every day | ORAL | 1 refills | Status: DC

## 2018-05-19 MED ORDER — VENLAFAXINE HCL ER 75 MG PO CP24: 75.0000 mg | ORAL_CAPSULE | Freq: Every day | ORAL | 1 refills | Status: DC

## 2018-05-19 MED ORDER — LITHIUM CARBONATE 150 MG PO CAPS: 150.0000 mg | ORAL_CAPSULE | Freq: Two times a day (BID) | ORAL | 1 refills | Status: DC
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
## 2018-05-27 ENCOUNTER — Ambulatory Visit: Payer: Medicare Other | Admitting: Nurse Practitioner

## 2018-05-27 ENCOUNTER — Encounter: Payer: Self-pay | Admitting: Nurse Practitioner

## 2018-05-27 ENCOUNTER — Other Ambulatory Visit: Payer: Self-pay

## 2018-05-27 VITALS — BP 104/67 | HR 94 | Temp 97.8°F | Ht 59.0 in | Wt 130.0 lb

## 2018-05-27 DIAGNOSIS — J441 Chronic obstructive pulmonary disease with (acute) exacerbation: Secondary | ICD-10-CM

## 2018-05-27 MED ORDER — ALBUTEROL SULFATE (2.5 MG/3ML) 0.083% IN NEBU: 2.5000 mg | INHALATION_SOLUTION | Freq: Four times a day (QID) | RESPIRATORY_TRACT | 1 refills | Status: AC | PRN

## 2018-05-27 MED ORDER — PREDNISONE 50 MG PO TABS: 50.0000 mg | ORAL_TABLET | Freq: Every day | ORAL | 0 refills | Status: AC

## 2018-05-27 NOTE — Patient Instructions (Addendum)
Mackenzie Ward,   Thank you for coming in to clinic today.  1. You have an acute exacerbation of your COPD or worsening bronchitis. - Start Prednisone 50mg  x 5 day steroid burst - Use albuterol q 4-6 hr regularly x 2-3 days. Continue Spiriva - No signs of pneumonia, but if you start having a fever, your left worsens and does not get better.  Call us for possible Chest Xray and / or antibiotics.  - If you have significant shortness of breath that does not improve after using your albuterol, please seek care in the ED.   Please schedule a follow-up appointment with Cassell Smiles, AGNP. Return 5-7 days if symptoms worsen or fail to improve.  If you have any other questions or concerns, please feel free to call the clinic or send a message through [place]. You may also schedule an earlier appointment if necessary.  You will receive a survey after today's visit either digitally by e-mail or paper by [employer]. Your experiences and feedback matter to [place].  Please respond so we know how we are doing as we provide care for you.   Cassell Smiles, DNP, AGNP-BC Adult Gerontology Nurse Practitioner [place]

## 2018-05-27 NOTE — Progress Notes (Signed)
Subjective:    Patient ID: Mackenzie Ward, female    DOB: 1946-08-07, 72 y.o.   MRN: [id number]  Mackenzie Ward is a 72 y.o. female presenting on 05/27/2018 for Cough (soreness, dull ache in the left side of the ribs, coughing, sinus pressure, fatigue and SOB  x 1 week )   HPI Cough Patient presents today with symptoms of cough and shortness of breath for last 8 days.  She has had what patient feels is a "big case of bronchitis," lots of coughing.  She did feel a sharp pain in left back and around her side to front of lower chest, which has improved some, position changes helped improve things.  Pain started Sat and was severe with burning pain for 2 days and now dull ache.   - Is feeling better overall, doesn't feel like she has pneumonia.   - Cough is always productive, but is clear/white and occasionally yellow.  No blood.  Associated symptoms include frontal sinus pressure, sinus congestion.   - Denies fever, chills, or sweats, nausea, vomiting, or diarrhea, tooth/jaw pain, ear pain/pressure, sore throat, rhinorrhea - Has been taking Mucinex without significant relief  Social History   Tobacco Use  . Smoking status: Current Every Day Smoker    Packs/day: 1.00    Years: 45.00    Pack years: 45.00    Types: Cigarettes    Last attempt to quit: 11/21/2015    Years since quitting: 2.5  . Smokeless tobacco: Never Used  Substance Use Topics  . Alcohol use: No    Alcohol/week: 0.0 standard drinks  . Drug use: No    Review of Systems Per HPI unless specifically indicated above     Objective:    BP 104/67 (BP Location: Left Arm, Patient Position: Sitting, Cuff Size: Normal)   Pulse 97   Temp 97.8 F (36.6 C) (Oral)   Ht 4\' 11"  (1.499 m)   Wt 130 lb (59 kg)   BMI 26.26 kg/m   Wt Readings from Last 3 Encounters:  05/27/18 130 lb (59 kg)  01/27/18 127 lb (57.6 kg)  11/12/17 125 lb (56.7 kg)    Physical Exam  Constitutional: She is oriented to person, place, and time.  She appears well-developed and well-nourished. No distress.  HENT:  Head: Normocephalic and atraumatic.  Right Ear: Hearing, tympanic membrane, external ear and ear canal normal.  Left Ear: Hearing, tympanic membrane, external ear and ear canal normal.  Nose: Right sinus exhibits frontal sinus tenderness. Right sinus exhibits no maxillary sinus tenderness. Left sinus exhibits frontal sinus tenderness. Left sinus exhibits no maxillary sinus tenderness.  Mouth/Throat: Uvula is midline, oropharynx is clear and moist and mucous membranes are normal. Tonsils are 0 on the right. Tonsils are 0 on the left.  Eyes: Pupils are equal, round, and reactive to light. Conjunctivae, EOM and lids are normal.  Cardiovascular: Normal rate, regular rhythm, S1 normal, S2 normal, normal heart sounds and intact distal pulses.  Pulmonary/Chest: Effort normal. No respiratory distress. She has decreased breath sounds (throughout all lobes). She has wheezes (throughout all lobes). She has rhonchi (throughout all lobes).  Lymphadenopathy:    She has no cervical adenopathy.    She has no axillary adenopathy.  Neurological: She is alert and oriented to person, place, and time.  Skin: Skin is warm and dry.  Psychiatric: She has a normal mood and affect. Her behavior is normal.  Vitals reviewed.  Results for orders placed or performed in visit  on 10/15/17  Comprehensive Metabolic Panel (CMET)  Result Value Ref Range   Glucose, Bld 86 65 - 99 mg/dL   BUN 8 7 - 25 mg/dL   Creat 0.71 0.60 - 0.93 mg/dL   BUN/Creatinine Ratio NOT APPLICABLE 6 - 22 (calc)   Sodium 136 135 - 146 mmol/L   Potassium 4.5 3.5 - 5.3 mmol/L   Chloride 106 98 - 110 mmol/L   CO2 24 20 - 32 mmol/L   Calcium 9.1 8.6 - 10.4 mg/dL   Total Protein 6.3 6.1 - 8.1 g/dL   Albumin 3.8 3.6 - 5.1 g/dL   Globulin 2.5 1.9 - 3.7 g/dL (calc)   AG Ratio 1.5 1.0 - 2.5 (calc)   Total Bilirubin 0.3 0.2 - 1.2 mg/dL   Alkaline phosphatase (APISO) 114 33 - 130 U/L    AST 19 10 - 35 U/L   ALT 17 6 - 29 U/L  Lipid Profile  Result Value Ref Range   Cholesterol 225 (H) <200 mg/dL   HDL 51 >50 mg/dL   Triglycerides 154 (H) <150 mg/dL   LDL Cholesterol (Calc) 145 (H) mg/dL (calc)   Total CHOL/HDL Ratio 4.4 <5.0 (calc)   Non-HDL Cholesterol (Calc) 174 (H) <130 mg/dL (calc)  CBC with Differential  Result Value Ref Range   WBC 7.9 3.8 - 10.8 Thousand/uL   RBC 5.50 (H) 3.80 - 5.10 Million/uL   Hemoglobin 15.5 11.7 - 15.5 g/dL   HCT 47.2 (H) 35.0 - 45.0 %   MCV 85.8 80.0 - 100.0 fL   MCH 28.2 27.0 - 33.0 pg   MCHC 32.8 32.0 - 36.0 g/dL   RDW 13.8 11.0 - 15.0 %   Platelets 363 140 - 400 Thousand/uL   MPV 10.9 7.5 - 12.5 fL   Neutro Abs 4,345 1,500 - 7,800 cells/uL   Lymphs Abs 2,433 850 - 3,900 cells/uL   WBC mixed population 1,027 (H) 200 - 950 cells/uL   Eosinophils Absolute 47 15 - 500 cells/uL   Basophils Absolute 47 0 - 200 cells/uL   Neutrophils Relative % 55 %   Total Lymphocyte 30.8 %   Monocytes Relative 13.0 %   Eosinophils Relative 0.6 %   Basophils Relative 0.6 %  HgB A1c  Result Value Ref Range   Hgb A1c MFr Bld 5.5 <5.7 % of total Hgb   Mean Plasma Glucose 111 (calc)   eAG (mmol/L) 6.2 (calc)  Vitamin D (25 hydroxy)  Result Value Ref Range   Vit D, 25-Hydroxy 26 (L) 30 - 100 ng/mL  TSH  Result Value Ref Range   TSH 3.37 0.40 - 4.50 mIU/L  B12  Result Value Ref Range   Vitamin B-12 457 200 - 1,100 pg/mL      Assessment & Plan:   Problem List Items Addressed This Visit    None    Visit Diagnoses    Acute exacerbation of chronic obstructive pulmonary disease (COPD) (HCC)    -  Primary   Relevant Medications   albuterol (PROVENTIL) (2.5 MG/3ML) 0.083% nebulizer solution   predniSONE (DELTASONE) 50 MG tablet    Consistent with mild acute exacerbation of COPD with worsening productive cough. Similar to prior exacerbations per patient report. - No hypoxia (93% on RA), afebrile, no recent hospitalization - Continues  Albuterol nebulizer, Spiriva  Plan: 1. Start Prednisone 50mg  x 5 day steroid burst 2. Use albuterol q 6 hr regularly x 2-3 days. Continue maintenance inhalers 3. Considered antibiotics, however afebrile and no hypoxia, would  not be indicated in mild AECOPD, unless recurrent or not improved on steroids. Offered Chest Xray to rule out pneumonia in future if no improvement.  Defer for now as no strong suspicion for pneumonia with improving left back/flank pain. 4. RTC about 1 week if not improving, otherwise strict return criteria to go to ED.  Meds ordered this encounter  Medications  . albuterol (PROVENTIL) (2.5 MG/3ML) 0.083% nebulizer solution    Sig: Take 3 mLs (2.5 mg total) by nebulization every 6 (six) hours as needed for wheezing or shortness of breath.    Dispense:  60 mL    Refill:  1    Fill only if patient needs refill    Order Specific Question:   Supervising Provider    Answer:   Olin Hauser [[id number]]  . predniSONE (DELTASONE) 50 MG tablet    Sig: Take 1 tablet (50 mg total) by mouth daily with breakfast for 5 days.    Dispense:  5 tablet    Refill:  0    Order Specific Question:   Supervising Provider    Answer:   Olin Hauser [[id number]]    Follow up plan: Return 5-7 days if symptoms worsen or fail to improve.  Mackenzie Smiles, DNP, AGPCNP-BC Adult Gerontology Primary Care Nurse Practitioner [facility] 05/27/2018, 9:02 AM

## 2018-06-16 ENCOUNTER — Ambulatory Visit (INDEPENDENT_AMBULATORY_CARE_PROVIDER_SITE_OTHER): Payer: Medicare Other | Admitting: Psychiatry

## 2018-06-16 ENCOUNTER — Encounter: Payer: Self-pay | Admitting: Psychiatry

## 2018-06-16 ENCOUNTER — Other Ambulatory Visit: Payer: Self-pay

## 2018-06-16 VITALS — BP 113/77 | HR 94 | Temp 99.2°F | Wt 134.6 lb

## 2018-06-16 DIAGNOSIS — F3160 Bipolar disorder, current episode mixed, unspecified: Secondary | ICD-10-CM | POA: Diagnosis not present

## 2018-06-16 DIAGNOSIS — F413 Other mixed anxiety disorders: Secondary | ICD-10-CM | POA: Diagnosis not present

## 2018-06-16 MED ORDER — LITHIUM CARBONATE 150 MG PO CAPS: 150.0000 mg | ORAL_CAPSULE | Freq: Two times a day (BID) | ORAL | 1 refills | Status: DC

## 2018-06-16 MED ORDER — GABAPENTIN 100 MG PO CAPS: 100.0000 mg | ORAL_CAPSULE | Freq: Three times a day (TID) | ORAL | 1 refills | Status: DC

## 2018-06-16 MED ORDER — HYDROXYZINE HCL 10 MG PO TABS: 10.0000 mg | ORAL_TABLET | Freq: Every day | ORAL | 2 refills | Status: DC
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
## 2018-07-30 ENCOUNTER — Ambulatory Visit: Payer: Self-pay | Admitting: Family Medicine

## 2018-07-31 ENCOUNTER — Encounter: Payer: Self-pay | Admitting: Family Medicine

## 2018-07-31 ENCOUNTER — Ambulatory Visit (INDEPENDENT_AMBULATORY_CARE_PROVIDER_SITE_OTHER): Payer: Medicare Other | Admitting: Family Medicine

## 2018-07-31 ENCOUNTER — Other Ambulatory Visit: Payer: Self-pay | Admitting: Family Medicine

## 2018-07-31 VITALS — BP 110/68 | HR 71 | Temp 98.3°F | Resp 16 | Ht 59.0 in | Wt 136.0 lb

## 2018-07-31 DIAGNOSIS — E559 Vitamin D deficiency, unspecified: Secondary | ICD-10-CM

## 2018-07-31 DIAGNOSIS — E782 Mixed hyperlipidemia: Secondary | ICD-10-CM

## 2018-07-31 DIAGNOSIS — J432 Centrilobular emphysema: Secondary | ICD-10-CM

## 2018-07-31 DIAGNOSIS — Z23 Encounter for immunization: Secondary | ICD-10-CM

## 2018-07-31 DIAGNOSIS — F319 Bipolar disorder, unspecified: Secondary | ICD-10-CM

## 2018-07-31 DIAGNOSIS — Z0000 Encounter for general adult medical examination without abnormal findings: Secondary | ICD-10-CM

## 2018-07-31 DIAGNOSIS — F329 Major depressive disorder, single episode, unspecified: Secondary | ICD-10-CM

## 2018-07-31 DIAGNOSIS — Z720 Tobacco use: Secondary | ICD-10-CM

## 2018-07-31 DIAGNOSIS — F419 Anxiety disorder, unspecified: Secondary | ICD-10-CM

## 2018-07-31 NOTE — Assessment & Plan Note (Signed)
Asymptomatic. Stable, remains improved COPD, mild emphysema Improved functional respiratory status on anti cholinergic No formal PFT or Pulmonology - patient declines Last AECOPD 05/2018, x 1 exac in >8 months Last imaging, confirm emphysema on CT Chest (11/2017 - lung CA screen)  Plan Continue Spiriva 25mcg daily inhalation - future consider switch to Anoro if recurrent exacerbations. Future may consider PFTs and Pulmonology Repeat yearly Chest CT screening for now - she will follow-up with their program in 11/2018 Smoking cessation - declined Follow-up in 6 months

## 2018-07-31 NOTE — Assessment & Plan Note (Signed)
Active smoker, not quite ready to quit - declines therapy S/p Low Dose CT Chest - 11/2017 negative, repeat yearly until age 72 Has stable COPD

## 2018-07-31 NOTE — Progress Notes (Signed)
Subjective:    Patient ID: Mackenzie Ward, female    DOB: 04/24/1946, 72 y.o.   MRN: [id number]  Mackenzie Ward is a 72 y.o. female presenting on 07/31/2018 for COPD   HPI   FOLLOW-UP COPD (Mild Emphysema) - Last visit with me 01/2018 for routine COPD follow-up, continued on Spiriva daily and had LDCT for Lung CA Screen, see prior notes for background information. - Interval update with Acute COPD exac on 05/27/18, seen in our office, treated with Prednisone burst 50 x 5 days and albuterol - Today patient reports doing well. Last flare 2 months ago, otherwise has only had 1 flare in >8 months. She states triggers with hot weather and humidity difficulty breathing, also URI bronchitis triggers. - She can tolerate regular activity without dyspnea. - Continues on Spiriva once daily - Rarely using Albuterol nebulizer once every 6 months except flare up - She plans to get next lung CT in 11/2018 - She has never seen Pulmonology or had PFT, and she is not interested at this time. Denies any worsening dyspnea, chest pain or tightness, productive cough, wheezing  Tobacco Abuse - Still active smoker about 1 ppd, overall chronic smoker >45 years. She was able to quit for up to 1 year in past, has tried variety of treatments in past with mixed results - She denies any interest in quitting smoking at this time - Additionally she states that she truly enjoys smoking now, and wants to keep enjoying it, and would consider stopping if she had severe breathing problems.  Health Maintenance: Due for Flu Shot, will receive today    Depression screen [facility] 2/9 07/31/2018 10/22/2017 03/06/2016  Decreased Interest 0 3 3  Down, Depressed, Hopeless 0 0 1  PHQ - 2 Score 0 3 4  Altered sleeping 0 0 3  Tired, decreased energy 0 3 3  Change in appetite 0 0 1  Feeling bad or failure about yourself  0 0 2  Trouble concentrating 0 0 3  Moving slowly or fidgety/restless 0 0 0  Suicidal thoughts 0 0 0    PHQ-9 Score 0 6 16  Difficult doing work/chores Not difficult at all Not difficult at all Somewhat difficult    Social History   Tobacco Use  . Smoking status: Current Every Day Smoker    Packs/day: 1.00    Years: 45.00    Pack years: 45.00    Types: Cigarettes    Last attempt to quit: 11/21/2015    Years since quitting: 2.6  . Smokeless tobacco: Current User  Substance Use Topics  . Alcohol use: No    Alcohol/week: 0.0 standard drinks  . Drug use: No    Review of Systems Per HPI unless specifically indicated above     Objective:    BP 110/68   Pulse 71   Temp 98.3 F (36.8 C) (Oral)   Resp 16   Ht 4\' 11"  (1.499 m)   Wt 136 lb (61.7 kg)   BMI 27.47 kg/m   Wt Readings from Last 3 Encounters:  07/31/18 136 lb (61.7 kg)  05/27/18 130 lb (59 kg)  01/27/18 127 lb (57.6 kg)    Physical Exam  Constitutional: She is oriented to person, place, and time. She appears well-developed and well-nourished. No distress.  Well-appearing, comfortable, cooperative  HENT:  Head: Normocephalic and atraumatic.  Mouth/Throat: Oropharynx is clear and moist.  Eyes: Conjunctivae are normal. Right eye exhibits no discharge. Left eye exhibits no discharge.  Cardiovascular: Normal rate, regular rhythm, normal heart sounds and intact distal pulses.  No murmur heard. Pulmonary/Chest: Effort normal. No respiratory distress. She has no wheezes. She has no rales.  Mildly reduced air movement diffusely, seems at baseline. Otherwise clear lungs.  Musculoskeletal: Normal range of motion. She exhibits no edema.  Neurological: She is alert and oriented to person, place, and time.  Skin: Skin is warm and dry. No rash noted. She is not diaphoretic. No erythema.  Psychiatric: She has a normal mood and affect. Her behavior is normal.  Well groomed, good eye contact, normal speech and thoughts  Nursing note and vitals reviewed.  Results for orders placed or performed in visit on 10/15/17   Comprehensive Metabolic Panel (CMET)  Result Value Ref Range   Glucose, Bld 86 65 - 99 mg/dL   BUN 8 7 - 25 mg/dL   Creat 0.71 0.60 - 0.93 mg/dL   BUN/Creatinine Ratio NOT APPLICABLE 6 - 22 (calc)   Sodium 136 135 - 146 mmol/L   Potassium 4.5 3.5 - 5.3 mmol/L   Chloride 106 98 - 110 mmol/L   CO2 24 20 - 32 mmol/L   Calcium 9.1 8.6 - 10.4 mg/dL   Total Protein 6.3 6.1 - 8.1 g/dL   Albumin 3.8 3.6 - 5.1 g/dL   Globulin 2.5 1.9 - 3.7 g/dL (calc)   AG Ratio 1.5 1.0 - 2.5 (calc)   Total Bilirubin 0.3 0.2 - 1.2 mg/dL   Alkaline phosphatase (APISO) 114 33 - 130 U/L   AST 19 10 - 35 U/L   ALT 17 6 - 29 U/L  Lipid Profile  Result Value Ref Range   Cholesterol 225 (H) <200 mg/dL   HDL 51 >50 mg/dL   Triglycerides 154 (H) <150 mg/dL   LDL Cholesterol (Calc) 145 (H) mg/dL (calc)   Total CHOL/HDL Ratio 4.4 <5.0 (calc)   Non-HDL Cholesterol (Calc) 174 (H) <130 mg/dL (calc)  CBC with Differential  Result Value Ref Range   WBC 7.9 3.8 - 10.8 Thousand/uL   RBC 5.50 (H) 3.80 - 5.10 Million/uL   Hemoglobin 15.5 11.7 - 15.5 g/dL   HCT 47.2 (H) 35.0 - 45.0 %   MCV 85.8 80.0 - 100.0 fL   MCH 28.2 27.0 - 33.0 pg   MCHC 32.8 32.0 - 36.0 g/dL   RDW 13.8 11.0 - 15.0 %   Platelets 363 140 - 400 Thousand/uL   MPV 10.9 7.5 - 12.5 fL   Neutro Abs 4,345 1,500 - 7,800 cells/uL   Lymphs Abs 2,433 850 - 3,900 cells/uL   WBC mixed population 1,027 (H) 200 - 950 cells/uL   Eosinophils Absolute 47 15 - 500 cells/uL   Basophils Absolute 47 0 - 200 cells/uL   Neutrophils Relative % 55 %   Total Lymphocyte 30.8 %   Monocytes Relative 13.0 %   Eosinophils Relative 0.6 %   Basophils Relative 0.6 %  HgB A1c  Result Value Ref Range   Hgb A1c MFr Bld 5.5 <5.7 % of total Hgb   Mean Plasma Glucose 111 (calc)   eAG (mmol/L) 6.2 (calc)  Vitamin D (25 hydroxy)  Result Value Ref Range   Vit D, 25-Hydroxy 26 (L) 30 - 100 ng/mL  TSH  Result Value Ref Range   TSH 3.37 0.40 - 4.50 mIU/L  B12  Result Value  Ref Range   Vitamin B-12 457 200 - 1,100 pg/mL      Assessment & Plan:   Problem List Items Addressed  This Visit    Chronic obstructive pulmonary disease ([place]) - Primary   Tobacco abuse    Other Visit Diagnoses    Needs flu shot       Relevant Orders   Flu vaccine HIGH DOSE PF (Completed)      No orders of the defined types were placed in this encounter.   Follow up plan: Return in about 4 months (around 11/29/2018) for Annual Physical.  Future labs ordered for 11/27/18  Nobie Putnam, [facility] 07/31/2018, 11:15 AM

## 2018-07-31 NOTE — Patient Instructions (Addendum)
Thank you for coming to the office today.  As discussed, when ready let me know and we can treat the smoking if you are interested.  Stay tuned for next CT Scan of Lungs in March 2020  Flu Shot today  DUE for FASTING BLOOD WORK (no food or drink after midnight before the lab appointment, only water or coffee without cream/sugar on the morning of)  SCHEDULE "Lab Only" visit in the morning at the clinic for lab draw in 4 MONTHS   - Make sure Lab Only appointment is at about 1 week before your next appointment, so that results will be available  For Lab Results, once available within 2-3 days of blood draw, you can can log in to MyChart online to view your results and a brief explanation. Also, we can discuss results at next follow-up visit.  Please schedule a Follow-up Appointment to: Return in about 4 months (around 11/29/2018) for Annual Physical.  If you have any other questions or concerns, please feel free to call the office or send a message through [place]. You may also schedule an earlier appointment if necessary.  Additionally, you may be receiving a survey about your experience at our office within a few days to 1 week by e-mail or mail. We value your feedback.  Nobie Putnam, DO [place]

## 2018-08-11 ENCOUNTER — Other Ambulatory Visit: Payer: Self-pay | Admitting: Psychiatry

## 2018-08-18 ENCOUNTER — Other Ambulatory Visit: Payer: Self-pay

## 2018-08-18 ENCOUNTER — Encounter: Payer: Self-pay | Admitting: Psychiatry

## 2018-08-18 ENCOUNTER — Ambulatory Visit: Payer: Medicare Other | Admitting: Psychiatry

## 2018-08-18 VITALS — BP 124/78 | HR 81 | Temp 97.4°F | Wt 140.4 lb

## 2018-08-18 DIAGNOSIS — F3160 Bipolar disorder, current episode mixed, unspecified: Secondary | ICD-10-CM

## 2018-08-18 DIAGNOSIS — F413 Other mixed anxiety disorders: Secondary | ICD-10-CM | POA: Diagnosis not present

## 2018-08-18 MED ORDER — HYDROXYZINE HCL 10 MG PO TABS: 10.0000 mg | ORAL_TABLET | Freq: Every day | ORAL | 2 refills | Status: DC

## 2018-08-18 MED ORDER — LITHIUM CARBONATE 150 MG PO CAPS: 150.0000 mg | ORAL_CAPSULE | Freq: Two times a day (BID) | ORAL | 1 refills | Status: DC

## 2018-08-18 MED ORDER — VENLAFAXINE HCL ER 75 MG PO CP24: 75.0000 mg | ORAL_CAPSULE | Freq: Every day | ORAL | 1 refills | Status: DC

## 2018-08-18 MED ORDER — QUETIAPINE FUMARATE 25 MG PO TABS: 25.0000 mg | ORAL_TABLET | Freq: Every day | ORAL | 2 refills | Status: DC

## 2018-08-18 MED ORDER — GABAPENTIN 300 MG PO CAPS: 300.0000 mg | ORAL_CAPSULE | Freq: Every day | ORAL | 1 refills | Status: DC
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
## 2018-10-20 ENCOUNTER — Ambulatory Visit: Payer: Medicare Other | Admitting: Psychiatry

## 2018-10-20 ENCOUNTER — Other Ambulatory Visit: Payer: Self-pay

## 2018-10-20 ENCOUNTER — Encounter: Payer: Self-pay | Admitting: Psychiatry

## 2018-10-20 VITALS — BP 128/84 | HR 103 | Temp 97.5°F | Wt 137.2 lb

## 2018-10-20 DIAGNOSIS — F413 Other mixed anxiety disorders: Secondary | ICD-10-CM

## 2018-10-20 DIAGNOSIS — F3160 Bipolar disorder, current episode mixed, unspecified: Secondary | ICD-10-CM | POA: Diagnosis not present

## 2018-10-20 MED ORDER — LITHIUM CARBONATE 150 MG PO CAPS: 150.0000 mg | ORAL_CAPSULE | Freq: Two times a day (BID) | ORAL | 1 refills | Status: DC

## 2018-10-20 MED ORDER — QUETIAPINE FUMARATE 25 MG PO TABS: 25.0000 mg | ORAL_TABLET | Freq: Every day | ORAL | 2 refills | Status: DC

## 2018-10-20 MED ORDER — VENLAFAXINE HCL ER 75 MG PO CP24: 75.0000 mg | ORAL_CAPSULE | Freq: Every day | ORAL | 1 refills | Status: DC

## 2018-10-20 MED ORDER — GABAPENTIN 300 MG PO CAPS: 300.0000 mg | ORAL_CAPSULE | Freq: Every day | ORAL | 1 refills | Status: DC
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
## 2018-10-28 ENCOUNTER — Ambulatory Visit (INDEPENDENT_AMBULATORY_CARE_PROVIDER_SITE_OTHER): Payer: Medicare Other

## 2018-10-28 ENCOUNTER — Other Ambulatory Visit: Payer: Self-pay

## 2018-10-28 VITALS — BP 156/88 | HR 88 | Temp 97.8°F | Resp 16 | Ht 58.5 in | Wt 133.6 lb

## 2018-10-28 DIAGNOSIS — Z1211 Encounter for screening for malignant neoplasm of colon: Secondary | ICD-10-CM

## 2018-10-28 DIAGNOSIS — Z0000 Encounter for general adult medical examination without abnormal findings: Secondary | ICD-10-CM | POA: Diagnosis not present

## 2018-10-28 NOTE — Patient Instructions (Addendum)
[name] , Thank you for taking time to come for your Medicare Wellness Visit. I appreciate your ongoing commitment to your health goals. Please review the following plan we discussed and let me know if I can assist you in the future.   Screening recommendations/referrals: Colonoscopy: completed 01/14/2013. Referral placed to GI, someone will call to schedule.  Mammogram: completed 06/18/2017, due 06/2019 Bone Density: completed 07/05/2015 Recommended yearly ophthalmology/optometry visit for glaucoma screening and checkup Recommended yearly dental visit for hygiene and checkup  Vaccinations: Influenza vaccine: up to date  Pneumococcal vaccine: up to date  Tdap vaccine: up to date Shingles vaccine: shingrix elgible, check with your insurance company for coverage     Advanced directives: Please bring a copy of your health care power of attorney and living will to the office at your convenience.  Conditions/risks identified: smoking- not ready to quit at this time.  Recommend drinking at least 6-8 glasses of water a day.   Next appointment: Follow up in one year for your annual wellness exam.    Preventive Care 65 Years and Older, Female Preventive care refers to lifestyle choices and visits with your health care provider that can promote health and wellness. What does preventive care include?  A yearly physical exam. This is also called an annual well check.  Dental exams once or twice a year.  Routine eye exams. Ask your health care provider how often you should have your eyes checked.  Personal lifestyle choices, including:  Daily care of your teeth and gums.  Regular physical activity.  Eating a healthy diet.  Avoiding tobacco and drug use.  Limiting alcohol use.  Practicing safe sex.  Taking low-dose aspirin every day.  Taking vitamin and mineral supplements as recommended by your health care provider. What happens during an annual well check? The services and  screenings done by your health care provider during your annual well check will depend on your age, overall health, lifestyle risk factors, and family history of disease. Counseling  Your health care provider may ask you questions about your:  Alcohol use.  Tobacco use.  Drug use.  Emotional well-being.  Home and relationship well-being.  Sexual activity.  Eating habits.  History of falls.  Memory and ability to understand (cognition).  Work and work Statistician.  Reproductive health. Screening  You may have the following tests or measurements:  Height, weight, and BMI.  Blood pressure.  Lipid and cholesterol levels. These may be checked every 5 years, or more frequently if you are over 76 years old.  Skin check.  Lung cancer screening. You may have this screening every year starting at age 32 if you have a 30-pack-year history of smoking and currently smoke or have quit within the past 15 years.  Fecal occult blood test (FOBT) of the stool. You may have this test every year starting at age 74.  Flexible sigmoidoscopy or colonoscopy. You may have a sigmoidoscopy every 5 years or a colonoscopy every 10 years starting at age 82.  Hepatitis C blood test.  Hepatitis B blood test.  Sexually transmitted disease (STD) testing.  Diabetes screening. This is done by checking your blood sugar (glucose) after you have not eaten for a while (fasting). You may have this done every 1-3 years.  Bone density scan. This is done to screen for osteoporosis. You may have this done starting at age 24.  Mammogram. This may be done every 1-2 years. Talk to your health care provider about how often  you should have regular mammograms. Talk with your health care provider about your test results, treatment options, and if necessary, the need for more tests. Vaccines  Your health care provider may recommend certain vaccines, such as:  Influenza vaccine. This is recommended every  year.  Tetanus, diphtheria, and acellular pertussis (Tdap, Td) vaccine. You may need a Td booster every 10 years.  Zoster vaccine. You may need this after age 84.  Pneumococcal 13-valent conjugate (PCV13) vaccine. One dose is recommended after age 87.  Pneumococcal polysaccharide (PPSV23) vaccine. One dose is recommended after age 56. Talk to your health care provider about which screenings and vaccines you need and how often you need them. This information is not intended to replace advice given to you by your health care provider. Make sure you discuss any questions you have with your health care provider. Document Released: 09/23/2015 Document Revised: 05/16/2016 Document Reviewed: 06/28/2015 Elsevier Interactive Patient Education  2017 [place] Prevention in the Home Falls can cause injuries. They can happen to people of all ages. There are many things you can do to make your home safe and to help prevent falls. What can I do on the outside of my home?  Regularly fix the edges of walkways and driveways and fix any cracks.  Remove anything that might make you trip as you walk through a door, such as a raised step or threshold.  Trim any bushes or trees on the path to your home.  Use bright outdoor lighting.  Clear any walking paths of anything that might make someone trip, such as rocks or tools.  Regularly check to see if handrails are loose or broken. Make sure that both sides of any steps have handrails.  Any raised decks and porches should have guardrails on the edges.  Have any leaves, snow, or ice cleared regularly.  Use sand or salt on walking paths during winter.  Clean up any spills in your garage right away. This includes oil or grease spills. What can I do in the bathroom?  Use night lights.  Install grab bars by the toilet and in the tub and shower. Do not use towel bars as grab bars.  Use non-skid mats or decals in the tub or shower.  If you  need to sit down in the shower, use a plastic, non-slip stool.  Keep the floor dry. Clean up any water that spills on the floor as soon as it happens.  Remove soap buildup in the tub or shower regularly.  Attach bath mats securely with double-sided non-slip rug tape.  Do not have throw rugs and other things on the floor that can make you trip. What can I do in the bedroom?  Use night lights.  Make sure that you have a light by your bed that is easy to reach.  Do not use any sheets or blankets that are too big for your bed. They should not hang down onto the floor.  Have a firm chair that has side arms. You can use this for support while you get dressed.  Do not have throw rugs and other things on the floor that can make you trip. What can I do in the kitchen?  Clean up any spills right away.  Avoid walking on wet floors.  Keep items that you use a lot in easy-to-reach places.  If you need to reach something above you, use a strong step stool that has a grab bar.  Keep electrical cords  out of the way.  Do not use floor polish or wax that makes floors slippery. If you must use wax, use non-skid floor wax.  Do not have throw rugs and other things on the floor that can make you trip. What can I do with my stairs?  Do not leave any items on the stairs.  Make sure that there are handrails on both sides of the stairs and use them. Fix handrails that are broken or loose. Make sure that handrails are as long as the stairways.  Check any carpeting to make sure that it is firmly attached to the stairs. Fix any carpet that is loose or worn.  Avoid having throw rugs at the top or bottom of the stairs. If you do have throw rugs, attach them to the floor with carpet tape.  Make sure that you have a light switch at the top of the stairs and the bottom of the stairs. If you do not have them, ask someone to add them for you. What else can I do to help prevent falls?  Wear shoes  that:  Do not have high heels.  Have rubber bottoms.  Are comfortable and fit you well.  Are closed at the toe. Do not wear sandals.  If you use a stepladder:  Make sure that it is fully opened. Do not climb a closed stepladder.  Make sure that both sides of the stepladder are locked into place.  Ask someone to hold it for you, if possible.  Clearly mark and make sure that you can see:  Any grab bars or handrails.  First and last steps.  Where the edge of each step is.  Use tools that help you move around (mobility aids) if they are needed. These include:  Canes.  Walkers.  Scooters.  Crutches.  Turn on the lights when you go into a dark area. Replace any light bulbs as soon as they burn out.  Set up your furniture so you have a clear path. Avoid moving your furniture around.  If any of your floors are uneven, fix them.  If there are any pets around you, be aware of where they are.  Review your medicines with your doctor. Some medicines can make you feel dizzy. This can increase your chance of falling. Ask your doctor what other things that you can do to help prevent falls. This information is not intended to replace advice given to you by your health care provider. Make sure you discuss any questions you have with your health care provider. Document Released: 06/23/2009 Document Revised: 02/02/2016 Document Reviewed: 10/01/2014 Elsevier Interactive Patient Education  2017 [employer].

## 2018-10-28 NOTE — Progress Notes (Signed)
Subjective:   Mackenzie Ward is a 73 y.o. female who presents for Medicare Annual (Subsequent) preventive examination.  Review of Systems:   Cardiac Risk Factors include: advanced age (>26men, >58 women);smoking/ tobacco exposure     Objective:     Vitals: BP (!) 156/88 (BP Location: Left Arm, Patient Position: Sitting, Cuff Size: Normal)   Pulse 88   Temp 97.8 F (36.6 C) (Oral)   Resp 16   Ht 4' 10.5" (1.486 m)   Wt 133 lb 9.6 oz (60.6 kg)   BMI 27.45 kg/m   Body mass index is 27.45 kg/m.  Advanced Directives 10/28/2018 10/22/2017  Does Patient Have a Medical Advance Directive? Yes No  Type of Advance Directive Living will;Healthcare Power of Attorney -  [place] in Chart? No - copy requested -  Would patient like information on creating a medical advance directive? - Yes (MAU/Ambulatory/Procedural Areas - Information given)  Some encounter information is confidential and restricted. Go to Review Flowsheets activity to see all data.    Tobacco Social History   Tobacco Use  Smoking Status Current Every Day Smoker  . Packs/day: 1.00  . Years: 45.00  . Pack years: 45.00  . Types: Cigarettes  . Last attempt to quit: 11/21/2015  . Years since quitting: 2.9  Smokeless Tobacco Never Used     Ready to quit: No Counseling given: Yes   Clinical Intake:  Pre-visit preparation completed: Yes  Pain : No/denies pain     Nutritional Status: BMI 25 -29 Overweight Nutritional Risks: None Diabetes: No  How often do you need to have someone help you when you read instructions, pamphlets, or other written materials from your doctor or pharmacy?: 1 - Never What is the last grade level you completed in school?: some college   Interpreter Needed?: No  Information entered by :: Velecia Ovitt,LPN   Past Medical History:  Diagnosis Date  . Depression   . High cholesterol    Past Surgical History:  Procedure Laterality Date  . BREAST  SURGERY Right 2013  . COLONOSCOPY  15 years ago  . EYE SURGERY    . TONSILLECTOMY     Family History  Problem Relation Age of Onset  . Lymphoma Mother        Non Hodgkin  . Dementia Mother   . Alzheimer's disease Mother   . Lymphoma Sister        Non Hodgkin  . Depression Sister   . Heart attack Father   . Alcohol abuse Father   . Dementia Brother   . Dementia Maternal Uncle    Social History   Socioeconomic History  . Marital status: Married    Spouse name: Not on file  . Number of children: Not on file  . Years of education: Not on file  . Highest education level: Some college, no degree  Occupational History  . Occupation: retired  Scientific laboratory technician  . Financial resource strain: Not hard at all  . Food insecurity:    Worry: Never true    Inability: Never true  . Transportation needs:    Medical: No    Non-medical: No  Tobacco Use  . Smoking status: Current Every Day Smoker    Packs/day: 1.00    Years: 45.00    Pack years: 45.00    Types: Cigarettes    Last attempt to quit: 11/21/2015    Years since quitting: 2.9  . Smokeless tobacco: Never Used  Substance  and Sexual Activity  . Alcohol use: No    Alcohol/week: 0.0 standard drinks  . Drug use: No  . Sexual activity: Not Currently  Lifestyle  . Physical activity:    Days per week: 0 days    Minutes per session: 0 min  . Stress: Not at all  Relationships  . Social connections:    Talks on phone: More than three times a week    Gets together: More than three times a week    Attends religious service: Never    Active member of club or organization: No    Attends meetings of clubs or organizations: Never    Relationship status: Married  Other Topics Concern  . Not on file  Social History Narrative   Goes to dinner with friends often, goes to see her mom at nursing home on a regular basis     Outpatient Encounter Medications as of 10/28/2018  Medication Sig  . albuterol (PROVENTIL) (2.5 MG/3ML) 0.083%  nebulizer solution Take 3 mLs (2.5 mg total) by nebulization every 6 (six) hours as needed for wheezing or shortness of breath.  . gabapentin (NEURONTIN) 300 MG capsule Take 1 capsule (300 mg total) by mouth at bedtime.  . hydrOXYzine (ATARAX/VISTARIL) 10 MG tablet Take 1 tablet (10 mg total) by mouth daily.  Marland Kitchen lithium carbonate 150 MG capsule Take 1 capsule (150 mg total) by mouth 2 (two) times daily with a meal.  . QUEtiapine (SEROQUEL) 25 MG tablet Take 1 tablet (25 mg total) by mouth at bedtime.  Marland Kitchen tiotropium (SPIRIVA HANDIHALER) 18 MCG inhalation capsule Place 1 capsule (18 mcg total) into inhaler and inhale daily.  Marland Kitchen venlafaxine XR (EFFEXOR XR) 75 MG 24 hr capsule Take 1 capsule (75 mg total) by mouth daily with breakfast.  . [DISCONTINUED] loratadine (CLARITIN) 10 MG tablet Take 1 tablet (10 mg total) by mouth daily. (Patient not taking: Reported on 10/28/2018)   No facility-administered encounter medications on file as of 10/28/2018.     Activities of Daily Living In your present state of health, do you have any difficulty performing the following activities: 10/28/2018  Hearing? N  Vision? Y  Comment cataracts, having surgery this spring   Walking or climbing stairs? Y  Comment SOB after 1 flight of stairs   Dressing or bathing? N  Doing errands, shopping? N  Preparing Food and eating ? N  Using the Toilet? N  In the past six months, have you accidently leaked urine? N  Do you have problems with loss of bowel control? N  Managing your Medications? N  Managing your Finances? N  Housekeeping or managing your Housekeeping? N  Some recent data might be hidden    Patient Care Team: Olin Hauser, DO as PCP - General (Family Medicine) Rainey Pines, MD as Consulting Physician (Psychiatry)    Assessment:   This is a routine wellness examination for [place].  Exercise Activities and Dietary recommendations Current Exercise Habits: The patient does not participate in  regular exercise at present, Exercise limited by: None identified  Goals    . DIET - INCREASE WATER INTAKE     Recommend drinking at least 6-8 glasses of water a day.     . Quit Smoking     Smoking cessation discussed       Fall Risk Fall Risk  10/28/2018 07/31/2018 10/22/2017 03/06/2017 03/06/2016  Falls in the past year? 0 0 No No No  Number falls in past yr: 0 - - - -  Injury with Fall? 0 - - - -  Comment - - - - -  Risk for fall due to : - - - - -  Follow up - Falls evaluation completed - - -  FALL RISK PREVENTION PERTAINING TO THE HOME:  Any stairs in or around the home? No  If so, are there any without handrails? n/a  Home free of loose throw rugs in walkways, pet beds, electrical cords, etc? Yes  Adequate lighting in your home to reduce risk of falls? Yes   ASSISTIVE DEVICES UTILIZED TO PREVENT FALLS:  Life alert? No  Use of a cane, walker or w/c? No  Grab bars in the bathroom? Yes  Shower chair or bench in shower? No Elevated toilet seat or a handicapped toilet? No   DME ORDERS:  DME order needed?  No   TIMED UP AND GO:  Was the test performed? No .  Length of time to ambulate 10 feet: 11 sec.   GAIT:  Appearance of gait: Gait stead-fast without the use of an assistive device.  Education: Fall risk prevention has been discussed.  Intervention(s) required? No    Depression Screen PHQ 2/9 Scores 10/28/2018 07/31/2018 10/22/2017 03/06/2016  PHQ - 2 Score 0 0 3 4  PHQ- 9 Score - 0 6 16     Cognitive Function     6CIT Screen 10/28/2018 10/22/2017  What Year? 0 points 0 points  What month? 0 points 0 points  What time? 0 points 0 points  Count back from 20 0 points 0 points  Months in reverse 0 points 0 points  Repeat phrase 0 points 0 points  Total Score 0 0    Immunization History  Administered Date(s) Administered  . Influenza, High Dose Seasonal PF 06/30/2014, 06/20/2015, 06/21/2017, 07/31/2018  . Influenza,inj,Quad PF,6+ Mos 06/30/2013  .  Influenza-Unspecified 06/30/2014  . Pneumococcal Conjugate-13 06/30/2014  . Pneumococcal Polysaccharide-23 10/22/2017  . Tdap 08/04/2014    Qualifies for Shingles Vaccine? Yes  Zostavax completed N/A receive vaccine at local pharmacy or Health Dept. Verbalized acceptance and understanding.  Tdap: up to date  Flu Vaccine: up to date   Pneumococcal Vaccine: up to date  Screening Tests Health Maintenance  Topic Date Due  . COLONOSCOPY  01/14/2018  . MAMMOGRAM  06/19/2019  . TETANUS/TDAP  08/04/2024  . INFLUENZA VACCINE  Completed  . DEXA SCAN  Completed  . Hepatitis C Screening  Completed  . PNA vac Low Risk Adult  Completed   Cancer Screenings:  Colorectal Screening: Completed 01/14/2013 Repeat every 5 years. Referral placed  Mammogram: Completed 06/18/2017. Repeat every 1-2 years  Bone Density: Completed 07/05/2015.  Lung Cancer Screening: (Low Dose CT Chest recommended if Age 55-80 years, 30 pack-year currently smoking OR have quit w/in 15years.) does qualify.  Completed   Additional Screening:  Hepatitis C Screening: does qualify; Completed 07/06/2015  Vision Screening: Recommended annual ophthalmology exams for early detection of glaucoma and other disorders of the eye. Is the patient up to date with their annual eye exam?  Yes  Who is the provider or what is the name of the office in which the pt attends annual eye exams? [facility]   Dental Screening: Recommended annual dental exams for proper oral hygiene  Community Resource Referral:  CRR required this visit?  No     Plan:    I have personally reviewed and addressed the Medicare Annual Wellness questionnaire and have noted the following in the patient's chart:  A. Medical and social history B. Use of alcohol, tobacco or illicit drugs  C. Current medications and supplements D. Functional ability and status E.  Nutritional status F.  Physical activity G. Advance directives H. List of other  physicians I.  Hospitalizations, surgeries, and ER visits in previous 12 months J.  [place] such as hearing and vision if needed, cognitive and depression L. Referrals and appointments   In addition, I have reviewed and discussed with patient certain preventive protocols, quality metrics, and best practice recommendations. A written personalized care plan for preventive services as well as general preventive health recommendations were provided to patient.   Signed,  Tyler Aas, LPN Nurse Health Advisor   Nurse Notes: patient complaining of a lump on her lower back. States she spoke with Dr.Hawkins about it before he left, approximately 2 years ago. Was told its just a fat deposit. She states recently it feels bigger and is causing pressure on her spine occasionally.  Patient has a 2x2cm palpable round mass on lower back on right side of her spine. There doesn't appear to be any skin breakdown, it is moveable in all directions. Patient denies any pain with palpation. Patient can move with no discomfort. She declines any radiating paid or numbness and/or tingling. She does state her right leg aches after walking a short distance but it does not seem to radiate from her back.   Patient was informed to call if she experienced any pain at site or numbness or tingling prior to her next appt on 12/01/2018 with Dr. Parks Ranger.

## 2018-11-05 ENCOUNTER — Telehealth: Payer: Self-pay | Admitting: *Deleted

## 2018-11-05 DIAGNOSIS — Z122 Encounter for screening for malignant neoplasm of respiratory organs: Secondary | ICD-10-CM

## 2018-11-05 DIAGNOSIS — Z87891 Personal history of nicotine dependence: Secondary | ICD-10-CM

## 2018-11-05 NOTE — Telephone Encounter (Signed)
Patient has been notified that annual lung cancer screening low dose CT scan is due currently or will be in near future. Confirmed that patient is within the age range of 55-77, and asymptomatic, (no signs or symptoms of lung cancer). Patient denies illness that would prevent curative treatment for lung cancer if found. Verified smoking history, (current, 46 pack year). The shared decision making visit was done 11/12/17. Patient is agreeable for CT scan being scheduled.

## 2018-11-10 ENCOUNTER — Ambulatory Visit: Payer: Medicare Other | Admitting: Anesthesiology

## 2018-11-10 ENCOUNTER — Encounter
Admission: RE | Disposition: A | Discharge status: Home / Self Care | Payer: Self-pay | Source: Home / Self Care | Attending: Gastroenterology

## 2018-11-10 ENCOUNTER — Ambulatory Visit
Admission: RE | Admit: 2018-11-10 | Discharge: 2018-11-10 | Disposition: A | Discharge status: Home / Self Care | Payer: Medicare Other | Attending: Gastroenterology | Admitting: Gastroenterology

## 2018-11-10 ENCOUNTER — Encounter: Payer: Self-pay | Admitting: Student

## 2018-11-10 ENCOUNTER — Other Ambulatory Visit: Payer: Self-pay

## 2018-11-10 DIAGNOSIS — D120 Benign neoplasm of cecum: Secondary | ICD-10-CM | POA: Diagnosis not present

## 2018-11-10 DIAGNOSIS — Z820 Family history of epilepsy and other diseases of the nervous system: Secondary | ICD-10-CM | POA: Insufficient documentation

## 2018-11-10 DIAGNOSIS — Z818 Family history of other mental and behavioral disorders: Secondary | ICD-10-CM | POA: Insufficient documentation

## 2018-11-10 DIAGNOSIS — F17210 Nicotine dependence, cigarettes, uncomplicated: Secondary | ICD-10-CM | POA: Diagnosis not present

## 2018-11-10 DIAGNOSIS — D125 Benign neoplasm of sigmoid colon: Secondary | ICD-10-CM | POA: Diagnosis not present

## 2018-11-10 DIAGNOSIS — Z7951 Long term (current) use of inhaled steroids: Secondary | ICD-10-CM | POA: Insufficient documentation

## 2018-11-10 DIAGNOSIS — D122 Benign neoplasm of ascending colon: Secondary | ICD-10-CM | POA: Insufficient documentation

## 2018-11-10 DIAGNOSIS — Z1211 Encounter for screening for malignant neoplasm of colon: Secondary | ICD-10-CM | POA: Insufficient documentation

## 2018-11-10 DIAGNOSIS — Z811 Family history of alcohol abuse and dependence: Secondary | ICD-10-CM | POA: Insufficient documentation

## 2018-11-10 DIAGNOSIS — Z860100 Personal history of colon polyps, unspecified: Secondary | ICD-10-CM

## 2018-11-10 DIAGNOSIS — Z807 Family history of other malignant neoplasms of lymphoid, hematopoietic and related tissues: Secondary | ICD-10-CM | POA: Diagnosis not present

## 2018-11-10 DIAGNOSIS — E7800 Pure hypercholesterolemia, unspecified: Secondary | ICD-10-CM | POA: Insufficient documentation

## 2018-11-10 DIAGNOSIS — F319 Bipolar disorder, unspecified: Secondary | ICD-10-CM | POA: Insufficient documentation

## 2018-11-10 DIAGNOSIS — Z8249 Family history of ischemic heart disease and other diseases of the circulatory system: Secondary | ICD-10-CM | POA: Diagnosis not present

## 2018-11-10 DIAGNOSIS — F418 Other specified anxiety disorders: Secondary | ICD-10-CM | POA: Diagnosis not present

## 2018-11-10 DIAGNOSIS — J449 Chronic obstructive pulmonary disease, unspecified: Secondary | ICD-10-CM | POA: Insufficient documentation

## 2018-11-10 DIAGNOSIS — Z79899 Other long term (current) drug therapy: Secondary | ICD-10-CM | POA: Diagnosis not present

## 2018-11-10 DIAGNOSIS — D123 Benign neoplasm of transverse colon: Secondary | ICD-10-CM | POA: Insufficient documentation

## 2018-11-10 HISTORY — DX: Unspecified asthma, uncomplicated: J45.909

## 2018-11-10 HISTORY — DX: Chronic obstructive pulmonary disease, unspecified: J44.9

## 2018-11-10 HISTORY — PX: COLONOSCOPY WITH PROPOFOL: SHX5780

## 2018-11-10 SURGERY — COLONOSCOPY WITH PROPOFOL
Anesthesia: General

## 2018-11-10 MED ORDER — PROPOFOL 500 MG/50ML IV EMUL
INTRAVENOUS | Status: DC | PRN
  Administered 2018-11-10: 100 ug/kg/min via INTRAVENOUS

## 2018-11-10 MED ORDER — PROPOFOL 10 MG/ML IV BOLUS
INTRAVENOUS | Status: AC
  Filled 2018-11-10: qty 40

## 2018-11-10 MED ORDER — PROPOFOL 10 MG/ML IV BOLUS
INTRAVENOUS | Status: DC | PRN
  Administered 2018-11-10: 60 mg via INTRAVENOUS
  Administered 2018-11-10: 40 mg via INTRAVENOUS

## 2018-11-10 MED ORDER — SODIUM CHLORIDE 0.9 % IV SOLN
INTRAVENOUS | Status: DC
  Administered 2018-11-10: 09:00:00 via INTRAVENOUS

## 2018-11-10 NOTE — Anesthesia Postprocedure Evaluation (Signed)
Anesthesia Post Note  Patient: Mackenzie Ward  Procedure(s) Performed: COLONOSCOPY WITH PROPOFOL (N/A )  Patient location during evaluation: Endoscopy Anesthesia Type: General Level of consciousness: awake and alert Pain management: pain level controlled Vital Signs Assessment: post-procedure vital signs reviewed and stable Respiratory status: spontaneous breathing, nonlabored ventilation, respiratory function stable and patient connected to nasal cannula oxygen Cardiovascular status: blood pressure returned to baseline and stable Postop Assessment: no apparent nausea or vomiting Anesthetic complications: no     Last Vitals:  Vitals:   11/10/18 1051 11/10/18 1052  BP: 110/80   Pulse:    Resp:    Temp:    SpO2:  96%    Last Pain:  Vitals:   11/10/18 1052  TempSrc:   PainSc: 0-No pain                 Martha Clan

## 2018-11-10 NOTE — Anesthesia Post-op Follow-up Note (Signed)
Anesthesia QCDR form completed.        

## 2018-11-10 NOTE — Transfer of Care (Signed)
Immediate Anesthesia Transfer of Care Note  Patient: Mackenzie Ward  Procedure(s) Performed: COLONOSCOPY WITH PROPOFOL (N/A )  Patient Location: PACU  Anesthesia Type:General  Level of Consciousness: sedated  Airway & Oxygen Therapy: Patient Spontanous Breathing and Patient connected to nasal cannula oxygen  Post-op Assessment: Report given to RN and Post -op Vital signs reviewed and stable  Post vital signs: Reviewed and stable  Last Vitals:  Vitals Value Taken Time  BP    Temp    Pulse 90 11/10/2018 10:23 AM  Resp 32 11/10/2018 10:23 AM  SpO2 94 % 11/10/2018 10:23 AM  Vitals shown include unvalidated device data.  Last Pain:  Vitals:   11/10/18 0848  TempSrc: Tympanic  PainSc: 0-No pain         Complications: No apparent anesthesia complications

## 2018-11-10 NOTE — Op Note (Signed)
[facility] Gastroenterology Patient Name: Mackenzie Ward Procedure Date: 11/10/2018 9:20 AM MRN: [id number] Account #: [id number] Date of Birth: [birth year] Admit Type: Outpatient Age: 73 Room: [facility] ENDO ROOM 4 Gender: Female Note Status: Finalized Procedure:            Colonoscopy Indications:          High risk colon cancer surveillance: Personal history                        of colonic polyps, Last colonoscopy: May 2014 Providers:            Lin Landsman MD, MD Referring MD:         Olin Hauser (Referring MD) Medicines:            General Anesthesia Complications:        No immediate complications. Estimated blood loss: None. Procedure:            Pre-Anesthesia Assessment:                       - Prior to the procedure, a History and Physical was                        performed, and patient medications and allergies were                        reviewed. The patient is competent. The risks and                        benefits of the procedure and the sedation options and                        risks were discussed with the patient. All questions                        were answered and informed consent was obtained.                        Patient identification and proposed procedure were                        verified by the physician, the nurse, the                        anesthesiologist, the anesthetist and the technician in                        the pre-procedure area in the procedure room in the                        endoscopy suite. Mental Status Examination: alert and                        oriented. Airway Examination: normal oropharyngeal                        airway and neck mobility. Respiratory Examination:                        clear to auscultation. CV Examination: normal.  Prophylactic Antibiotics: The patient does not require                        prophylactic antibiotics. Prior Anticoagulants: The                         patient has taken no previous anticoagulant or                        antiplatelet agents. ASA Grade Assessment: III - A                        patient with severe systemic disease. After reviewing                        the risks and benefits, the patient was deemed in                        satisfactory condition to undergo the procedure. The                        anesthesia plan was to use general anesthesia.                        Immediately prior to administration of medications, the                        patient was re-assessed for adequacy to receive                        sedatives. The heart rate, respiratory rate, oxygen                        saturations, blood pressure, adequacy of pulmonary                        ventilation, and response to care were monitored                        throughout the procedure. The physical status of the                        patient was re-assessed after the procedure.                       After obtaining informed consent, the colonoscope was                        passed under direct vision. Throughout the procedure,                        the patient's blood pressure, pulse, and oxygen                        saturations were monitored continuously. The                        Colonoscope was introduced through the anus and                        advanced to  the the cecum, identified by appendiceal                        orifice and ileocecal valve. The colonoscopy was                        unusually difficult due to significant looping.                        Successful completion of the procedure was aided by                        applying abdominal pressure. The patient tolerated the                        procedure well. The quality of the bowel preparation                        was evaluated using the BBPS [facility] Bowel Preparation                        Scale) with scores of: Right Colon = 3, Transverse                         Colon = 3 and Left Colon = 3 (entire mucosa seen well                        with no residual staining, small fragments of stool or                        opaque liquid). The total BBPS score equals 9. Findings:      The perianal and digital rectal examinations were normal. Pertinent       negatives include normal sphincter tone and no palpable rectal lesions.      A 8 mm polyp was found in the cecum. The polyp was sessile. The polyp       was removed with a hot snare. Resection and retrieval were complete.      Three sessile polyps were found in the transverse colon and ascending       colon. The polyps were 5 to 7 mm in size. These polyps were removed with       a hot snare. Resection and retrieval were complete.      A 10 mm polyp was found in the transverse colon. The polyp was sessile.       The polyp was removed with a hot snare. Resection and retrieval were       complete.      Two sessile polyps were found in the transverse colon. The polyps were 5       mm in size. These polyps were removed with a cold snare. Resection and       retrieval were complete.      A 6 mm polyp was found in the sigmoid colon. The polyp was sessile. The       polyp was removed with a hot snare. Resection and retrieval were       complete.      The retroflexed view of the distal rectum and anal verge was normal and       showed no anal or  rectal abnormalities. Impression:           - One 8 mm polyp in the cecum, removed with a hot                        snare. Resected and retrieved.                       - Three 5 to 7 mm polyps in the transverse colon and in                        the ascending colon, removed with a hot snare. Resected                        and retrieved.                       - One 10 mm polyp in the transverse colon, removed with                        a hot snare. Resected and retrieved.                       - Two 5 mm polyps in the transverse colon, removed with                         a cold snare. Resected and retrieved.                       - One 6 mm polyp in the sigmoid colon, removed with a                        hot snare. Resected and retrieved.                       - The distal rectum and anal verge are normal on                        retroflexion view. Recommendation:       - Discharge patient to home (with escort).                       - Resume previous diet today.                       - Continue present medications.                       - Await pathology results.                       - Repeat colonoscopy in 3 - 5 years for surveillance of                        multiple polyps. Procedure Code(s):    --- Professional ---                       [phone], Colonoscopy, flexible; with removal of tumor(s),                        polyp(s),  or other lesion(s) by snare technique Diagnosis Code(s):    --- Professional ---                       Z86.010, Personal history of colonic polyps                       D12.0, Benign neoplasm of cecum                       D12.3, Benign neoplasm of transverse colon (hepatic                        flexure or splenic flexure)                       D12.5, Benign neoplasm of sigmoid colon                       D12.2, Benign neoplasm of ascending colon CPT copyright 2018 American Medical Association. All rights reserved. The codes documented in this report are preliminary and upon coder review may  be revised to meet current compliance requirements. Dr. Ulyess Mort Lin Landsman MD, MD 11/10/2018 10:21:05 AM This report has been signed electronically. Number of Addenda: 0 Note Initiated On: 11/10/2018 9:20 AM Scope Withdrawal Time: 0 hours 36 minutes 24 seconds  Total Procedure Duration: 0 hours 40 minutes 14 seconds       [facility]

## 2018-11-10 NOTE — H&P (Signed)
Mackenzie Darby, MD [address]  [place]  [place], [place] [address]  Main: [phone]  Fax: [phone] Pager: [phone]  Primary Care Physician:  Olin Hauser, DO Primary Gastroenterologist:  Dr. Cephas Ward  Pre-Procedure History & Physical: HPI:  Mackenzie Ward is a 73 y.o. female is here for an colonoscopy.   Past Medical History:  Diagnosis Date  . Asthma   . COPD (chronic obstructive pulmonary disease) ([place])   . Depression   . High cholesterol     Past Surgical History:  Procedure Laterality Date  . BREAST SURGERY Right 2013  . COLONOSCOPY  15 years ago  . EYE SURGERY    . TONSILLECTOMY      Prior to Admission medications   Medication Sig Start Date End Date Taking? Authorizing Provider  gabapentin (NEURONTIN) 300 MG capsule Take 1 capsule (300 mg total) by mouth at bedtime. 10/20/18  Yes Rainey Pines, MD  hydrOXYzine (ATARAX/VISTARIL) 10 MG tablet Take 1 tablet (10 mg total) by mouth daily. 08/18/18  Yes Rainey Pines, MD  lithium carbonate 150 MG capsule Take 1 capsule (150 mg total) by mouth 2 (two) times daily with a meal. 10/20/18  Yes Rainey Pines, MD  QUEtiapine (SEROQUEL) 25 MG tablet Take 1 tablet (25 mg total) by mouth at bedtime. 10/20/18  Yes Rainey Pines, MD  tiotropium (SPIRIVA HANDIHALER) 18 MCG inhalation capsule Place 1 capsule (18 mcg total) into inhaler and inhale daily. 10/22/17  Yes Karamalegos, Devonne Doughty, DO  venlafaxine XR (EFFEXOR XR) 75 MG 24 hr capsule Take 1 capsule (75 mg total) by mouth daily with breakfast. 10/20/18  Yes Rainey Pines, MD  albuterol (PROVENTIL) (2.5 MG/3ML) 0.083% nebulizer solution Take 3 mLs (2.5 mg total) by nebulization every 6 (six) hours as needed for wheezing or shortness of breath. 05/27/18   Mikey College, NP    Allergies as of 10/28/2018  . (No Known Allergies)    Family History  Problem Relation Age of Onset  . Lymphoma Mother        Non Hodgkin  . Dementia Mother   .  Alzheimer's disease Mother   . Lymphoma Sister        Non Hodgkin  . Depression Sister   . Heart attack Father   . Alcohol abuse Father   . Dementia Brother   . Dementia Maternal Uncle     Social History   Socioeconomic History  . Marital status: Married    Spouse name: Not on file  . Number of children: Not on file  . Years of education: Not on file  . Highest education level: Some college, no degree  Occupational History  . Occupation: retired  Scientific laboratory technician  . Financial resource strain: Not hard at all  . Food insecurity:    Worry: Never true    Inability: Never true  . Transportation needs:    Medical: No    Non-medical: No  Tobacco Use  . Smoking status: Current Every Day Smoker    Packs/day: 1.00    Years: 45.00    Pack years: 45.00    Types: Cigarettes    Last attempt to quit: 11/21/2015    Years since quitting: 2.9  . Smokeless tobacco: Never Used  Substance and Sexual Activity  . Alcohol use: No    Alcohol/week: 0.0 standard drinks  . Drug use: No  . Sexual activity: Not Currently  Lifestyle  . Physical activity:    Days per week: 0  days    Minutes per session: 0 min  . Stress: Not at all  Relationships  . Social connections:    Talks on phone: More than three times a week    Gets together: More than three times a week    Attends religious service: Never    Active member of club or organization: No    Attends meetings of clubs or organizations: Never    Relationship status: Married  . Intimate partner violence:    Fear of current or ex partner: No    Emotionally abused: No    Physically abused: No    Forced sexual activity: No  Other Topics Concern  . Not on file  Social History Narrative   Goes to dinner with friends often, goes to see her mom at nursing home on a regular basis     Review of Systems: See HPI, otherwise negative ROS  Physical Exam: BP 133/87   Pulse (!) 106   Temp (!) 97.5 F (36.4 C) (Tympanic)   Resp 18   Ht 4\' 10"   (1.473 m)   Wt 61.2 kg   SpO2 97%   BMI 28.22 kg/m  General:   Alert,  pleasant and cooperative in NAD Head:  Normocephalic and atraumatic. Neck:  Supple; no masses or thyromegaly. Lungs:  Clear throughout to auscultation.    Heart:  Regular rate and rhythm. Abdomen:  Soft, nontender and nondistended. Normal bowel sounds, without guarding, and without rebound.   Neurologic:  Alert and  oriented x4;  grossly normal neurologically.  Impression/Plan: Mackenzie Ward is here for an colonoscopy to be performed for personal h/o colon polyps  Risks, benefits, limitations, and alternatives regarding  colonoscopy have been reviewed with the patient.  Questions have been answered.  All parties agreeable.   Sherri Sear, MD  11/10/2018, 9:15 AM

## 2018-11-10 NOTE — Anesthesia Preprocedure Evaluation (Signed)
Anesthesia Evaluation  Patient identified by MRN, date of birth, ID band Patient awake    Reviewed: Allergy & Precautions, H&P , NPO status , Patient's Chart, lab work & pertinent test results, reviewed documented beta blocker date and time   History of Anesthesia Complications Negative for: history of anesthetic complications  Airway Mallampati: I  TM Distance: >3 FB Neck ROM: full    Dental  (+) Dental Advidsory Given   Pulmonary shortness of breath and with exertion, asthma , neg sleep apnea, COPD,  COPD inhaler, neg recent URI, Current Smoker,           Cardiovascular Exercise Tolerance: Good negative cardio ROS       Neuro/Psych PSYCHIATRIC DISORDERS Anxiety Depression Bipolar Disorder negative neurological ROS     GI/Hepatic negative GI ROS, Neg liver ROS,   Endo/Other  negative endocrine ROS  Renal/GU negative Renal ROS  negative genitourinary   Musculoskeletal   Abdominal   Peds  Hematology negative hematology ROS (+)   Anesthesia Other Findings Past Medical History: No date: Asthma No date: COPD (chronic obstructive pulmonary disease) ([facility]) No date: Depression No date: High cholesterol   Reproductive/Obstetrics negative OB ROS                             Anesthesia Physical Anesthesia Plan  ASA: III  Anesthesia Plan: General   Post-op Pain Management:    Induction: Intravenous  PONV Risk Score and Plan: 2 and Propofol infusion and TIVA  Airway Management Planned: Natural Airway and Nasal Cannula  Additional Equipment:   Intra-op Plan:   Post-operative Plan:   Informed Consent: I have reviewed the patients History and Physical, chart, labs and discussed the procedure including the risks, benefits and alternatives for the proposed anesthesia with the patient or authorized representative who has indicated his/her understanding and acceptance.     Dental  Advisory Given  Plan Discussed with: Anesthesiologist, CRNA and Surgeon  Anesthesia Plan Comments:         Anesthesia Quick Evaluation

## 2018-11-12 ENCOUNTER — Encounter: Payer: Self-pay | Admitting: Gastroenterology

## 2018-11-12 LAB — SURGICAL PATHOLOGY

## 2018-11-13 ENCOUNTER — Encounter: Payer: Self-pay | Admitting: *Deleted

## 2018-11-14 ENCOUNTER — Ambulatory Visit
Admission: RE | Admit: 2018-11-14 | Discharge: 2018-11-14 | Disposition: A | Discharge status: Home / Self Care | Payer: Medicare Other | Source: Ambulatory Visit | Attending: Oncology | Admitting: Oncology

## 2018-11-14 ENCOUNTER — Encounter: Payer: Self-pay | Admitting: Gastroenterology

## 2018-11-14 ENCOUNTER — Other Ambulatory Visit: Payer: Self-pay

## 2018-11-14 DIAGNOSIS — Z87891 Personal history of nicotine dependence: Secondary | ICD-10-CM | POA: Diagnosis present

## 2018-11-14 DIAGNOSIS — Z122 Encounter for screening for malignant neoplasm of respiratory organs: Secondary | ICD-10-CM | POA: Diagnosis present

## 2018-11-17 ENCOUNTER — Encounter: Payer: Self-pay | Admitting: *Deleted

## 2018-11-26 ENCOUNTER — Encounter: Payer: Self-pay | Admitting: *Deleted

## 2018-11-27 ENCOUNTER — Other Ambulatory Visit: Payer: Self-pay

## 2018-11-27 ENCOUNTER — Other Ambulatory Visit: Payer: Medicare Other

## 2018-11-27 DIAGNOSIS — E782 Mixed hyperlipidemia: Secondary | ICD-10-CM

## 2018-11-27 DIAGNOSIS — Z0000 Encounter for general adult medical examination without abnormal findings: Secondary | ICD-10-CM

## 2018-11-27 DIAGNOSIS — E559 Vitamin D deficiency, unspecified: Secondary | ICD-10-CM

## 2018-11-27 DIAGNOSIS — F319 Bipolar disorder, unspecified: Secondary | ICD-10-CM

## 2018-11-28 LAB — COMPLETE METABOLIC PANEL WITH GFR
AG Ratio: 1.4 (calc) (ref 1.0–2.5)
ALBUMIN MSPROF: 3.8 g/dL (ref 3.6–5.1)
ALT: 12 U/L (ref 6–29)
AST: 18 U/L (ref 10–35)
Alkaline phosphatase (APISO): 108 U/L (ref 37–153)
BUN: 8 mg/dL (ref 7–25)
CO2: 26 mmol/L (ref 20–32)
Calcium: 9.2 mg/dL (ref 8.6–10.4)
Chloride: 103 mmol/L (ref 98–110)
Creat: 0.65 mg/dL (ref 0.60–0.93)
GFR, Est African American: 104 mL/min/{1.73_m2} (ref >=60)
GFR, Est Non African American: 89 mL/min/{1.73_m2} (ref >=60)
GLUCOSE: 53 mg/dL — AB (ref 65–99)
Globulin: 2.8 g/dL (calc) (ref 1.9–3.7)
Potassium: 4.9 mmol/L (ref 3.5–5.3)
Sodium: 137 mmol/L (ref 135–146)
Total Bilirubin: 0.2 mg/dL (ref 0.2–1.2)
Total Protein: 6.6 g/dL (ref 6.1–8.1)

## 2018-11-28 LAB — CBC WITH DIFFERENTIAL/PLATELET
Absolute Monocytes: 887 cells/uL (ref 200–950)
Basophils Absolute: 51 cells/uL (ref 0–200)
Basophils Relative: 0.5 %
Eosinophils Absolute: 235 cells/uL (ref 15–500)
Eosinophils Relative: 2.3 %
HCT: 44.2 % (ref 35.0–45.0)
HEMOGLOBIN: 14.5 g/dL (ref 11.7–15.5)
Lymphs Abs: 3397 cells/uL (ref 850–3900)
MCH: 28.7 pg (ref 27.0–33.0)
MCHC: 32.8 g/dL (ref 32.0–36.0)
MCV: 87.4 fL (ref 80.0–100.0)
MPV: 10.6 fL (ref 7.5–12.5)
Monocytes Relative: 8.7 %
Neutro Abs: 5630 cells/uL (ref 1500–7800)
Neutrophils Relative %: 55.2 %
Platelets: 419 10*3/uL — ABNORMAL HIGH (ref 140–400)
RBC: 5.06 10*6/uL (ref 3.80–5.10)
RDW: 14.1 % (ref 11.0–15.0)
Total Lymphocyte: 33.3 %
WBC: 10.2 10*3/uL (ref 3.8–10.8)

## 2018-11-28 LAB — HEMOGLOBIN A1C
Hgb A1c MFr Bld: 5.5 % of total Hgb (ref <5.7)
Mean Plasma Glucose: 111 (calc)
eAG (mmol/L): 6.2 (calc)

## 2018-11-28 LAB — VITAMIN D 25 HYDROXY (VIT D DEFICIENCY, FRACTURES): Vit D, 25-Hydroxy: 13 ng/mL — ABNORMAL LOW (ref 30–100)

## 2018-11-28 LAB — LIPID PANEL
Cholesterol: 275 mg/dL — ABNORMAL HIGH (ref <200)
HDL: 56 mg/dL (ref >=50)
LDL CHOLESTEROL (CALC): 178 mg/dL — AB
Non-HDL Cholesterol (Calc): 219 mg/dL (calc) — ABNORMAL HIGH (ref <130)
Total CHOL/HDL Ratio: 4.9 (calc) (ref <5.0)
Triglycerides: 240 mg/dL — ABNORMAL HIGH (ref <150)

## 2018-11-28 LAB — TSH: TSH: 4.47 m[IU]/L (ref 0.40–4.50)

## 2018-12-01 ENCOUNTER — Encounter: Payer: Self-pay | Admitting: Family Medicine

## 2018-12-01 ENCOUNTER — Other Ambulatory Visit: Payer: Self-pay | Admitting: Family Medicine

## 2018-12-01 DIAGNOSIS — R0609 Other forms of dyspnea: Secondary | ICD-10-CM

## 2018-12-01 DIAGNOSIS — J449 Chronic obstructive pulmonary disease, unspecified: Secondary | ICD-10-CM

## 2018-12-01 NOTE — Telephone Encounter (Signed)
Pt. Called requesting refill on her Spiriva called into  [place]

## 2018-12-02 NOTE — Telephone Encounter (Signed)
Please ask if patient can schedule a Virtual Telephone visit this week to review her COPD and to allow [place] to re order the Spiriva for her.  Nobie Putnam, [facility] 12/02/2018, 6:11 PM

## 2018-12-10 ENCOUNTER — Other Ambulatory Visit: Payer: Self-pay | Admitting: Family Medicine

## 2018-12-10 DIAGNOSIS — J449 Chronic obstructive pulmonary disease, unspecified: Secondary | ICD-10-CM

## 2018-12-10 DIAGNOSIS — R0609 Other forms of dyspnea: Secondary | ICD-10-CM

## 2018-12-11 ENCOUNTER — Telehealth: Payer: Self-pay

## 2018-12-11 DIAGNOSIS — F3162 Bipolar disorder, current episode mixed, moderate: Secondary | ICD-10-CM

## 2018-12-11 MED ORDER — VENLAFAXINE HCL ER 75 MG PO CP24: 75.0000 mg | ORAL_CAPSULE | Freq: Every day | ORAL | 0 refills | Status: DC

## 2018-12-11 MED ORDER — HYDROXYZINE HCL 10 MG PO TABS: 10.0000 mg | ORAL_TABLET | Freq: Every day | ORAL | 0 refills | Status: DC

## 2018-12-11 NOTE — Telephone Encounter (Signed)
pt left a message that she will not have enough mediations to last until her appt with dr faheem wants to know if enough can be sent to pharmacy until appt.  opt needs hydroxyzine, lithium , seroquel and effexor,.

## 2018-12-11 NOTE — Telephone Encounter (Signed)
Sent effexor and vistaril to pharmacy , looks like she has supplies pf everything else.

## 2018-12-15 ENCOUNTER — Other Ambulatory Visit: Payer: Self-pay

## 2018-12-15 ENCOUNTER — Encounter: Payer: Self-pay | Admitting: Psychiatry

## 2018-12-15 ENCOUNTER — Ambulatory Visit (INDEPENDENT_AMBULATORY_CARE_PROVIDER_SITE_OTHER): Payer: Medicare Other | Admitting: Psychiatry

## 2018-12-15 DIAGNOSIS — F413 Other mixed anxiety disorders: Secondary | ICD-10-CM

## 2018-12-15 DIAGNOSIS — F3162 Bipolar disorder, current episode mixed, moderate: Secondary | ICD-10-CM

## 2018-12-15 MED ORDER — LITHIUM CARBONATE 150 MG PO CAPS: 150.0000 mg | ORAL_CAPSULE | Freq: Two times a day (BID) | ORAL | 1 refills | Status: DC

## 2018-12-15 MED ORDER — HYDROXYZINE HCL 10 MG PO TABS: 10.0000 mg | ORAL_TABLET | Freq: Two times a day (BID) | ORAL | 2 refills | Status: DC

## 2018-12-15 MED ORDER — QUETIAPINE FUMARATE 25 MG PO TABS: 25.0000 mg | ORAL_TABLET | Freq: Every day | ORAL | 2 refills | Status: DC

## 2018-12-15 MED ORDER — VENLAFAXINE HCL ER 75 MG PO CP24: 75.0000 mg | ORAL_CAPSULE | Freq: Every day | ORAL | 1 refills | Status: DC

## 2018-12-15 NOTE — Progress Notes (Cosign Needed)
TC on  12-15-18 @ 9:45 spoke with patients . Medications and pharmacy was reviewed and updated.  Allergies , medical hx and surgery hx was reviewed with no changes. Vital unable to obtain due to this is a phone or web consult.

## 2018-12-15 NOTE — Progress Notes (Signed)
Patient ID: Mackenzie Ward, female   DOB: 10-May-1946, 73 y.o.   MRN: [id number]   Patient is a 73 year old female with history of Bipolar disorder and anxiety who was followed up for her medications.she reported that she has been doing well. She reported that she sprained her ankle almost a week ago but did not go to the ER. Her swelling is going down and her ankle is improving. She reported that her husband is helping her with the household chores at this time. She reported that she stays at home. Her son wedding was rescheduled from March 29 to November. She reported that she is gaining weight as she has been sitting at home and eating ice cream peanuts and chips.. She reported that she cannot exercise due to her sprained ankle. She reported that she is worried about the pandemic and trying to stay safe. She is compliant with her medications. She is interested in going higher on the dose of hydroxyzine at this time.   Plan. I will refill her medications for 90 days supply.. Advise patient to call if she is noticing worsening of her symptoms and she agreed with the plan.  I have discussed the assessment and treatment plan with the patient. The patient was provided an opportunity to ask questions and all were answered. The patient agreed with the plan and demonstrated an understanding of the instructions.   The patient was advised to call back or seek an in-person evaluation if the symptoms worsen or if the condition fails to improve as anticipated.   I provided 10 minutes of non-face-to-face time during this encounter.

## 2018-12-31 ENCOUNTER — Other Ambulatory Visit: Payer: Self-pay

## 2018-12-31 ENCOUNTER — Telehealth: Payer: Self-pay

## 2018-12-31 ENCOUNTER — Ambulatory Visit: Payer: Medicare Other | Admitting: Family Medicine

## 2018-12-31 NOTE — Telephone Encounter (Signed)
Patient was given follow-up options. She opted to go to [facility] to have x-ray and boot if possible.  Nobie Putnam, [facility] 12/31/2018, 11:53 AM

## 2018-12-31 NOTE — Telephone Encounter (Signed)
The pt was put on the schedule for a face to face visit at 1:40pm for Rt ankle pain that radiate up the leg. The pt state she was stepping down out of her husband truck and twisted her Rt ankle x 3 weeks ago. She is unsure if it maybe sprained or broken. She is able to bear weight and even walk, but states she does feel some discomfort while doing it. The ankle is swollen, but admits the swelling have improved of time. She feel like she need a boot for the ankle. Please advise

## 2019-03-30 ENCOUNTER — Other Ambulatory Visit: Payer: Self-pay

## 2019-03-30 ENCOUNTER — Encounter: Payer: Self-pay | Admitting: Psychiatry

## 2019-03-30 ENCOUNTER — Ambulatory Visit (INDEPENDENT_AMBULATORY_CARE_PROVIDER_SITE_OTHER): Payer: Medicare Other | Admitting: Psychiatry

## 2019-03-30 DIAGNOSIS — F413 Other mixed anxiety disorders: Secondary | ICD-10-CM

## 2019-03-30 DIAGNOSIS — F3162 Bipolar disorder, current episode mixed, moderate: Secondary | ICD-10-CM

## 2019-03-30 MED ORDER — GABAPENTIN 300 MG PO CAPS: 300.0000 mg | ORAL_CAPSULE | Freq: Every day | ORAL | 1 refills | Status: DC

## 2019-03-30 MED ORDER — QUETIAPINE FUMARATE 25 MG PO TABS: 25.0000 mg | ORAL_TABLET | Freq: Every day | ORAL | 2 refills | Status: DC

## 2019-03-30 MED ORDER — HYDROXYZINE HCL 10 MG PO TABS: 10.0000 mg | ORAL_TABLET | Freq: Two times a day (BID) | ORAL | 2 refills | Status: DC

## 2019-03-30 MED ORDER — LITHIUM CARBONATE 150 MG PO CAPS: 150.0000 mg | ORAL_CAPSULE | Freq: Two times a day (BID) | ORAL | 1 refills | Status: DC

## 2019-03-30 MED ORDER — VENLAFAXINE HCL ER 75 MG PO CP24: 75.0000 mg | ORAL_CAPSULE | Freq: Every day | ORAL | 1 refills | Status: DC

## 2019-03-30 NOTE — Progress Notes (Signed)
Patient ID: Mackenzie Ward, female   DOB: Nov 05, 1945, 73 y.o.   MRN: [id number]   Patient a 73 year old female with history ofBipolar anxiety who was seen for medication management. She reported that she has been feeling more depressed and anxious due to the covert. She has been sleeping long hours. Patient reported that she staying at home and has been compliant with her medications. Patient reported that she does not have any Adverse effects of her current psychotropic medications. She reported that she has been staying at home. She currently denied having any suicidal or homicidal ideation or plans. We discussed about her medications in detail. She appeared calm and alert during the interview.  Plan I will refill her medications as prescribed. Follow-up in two months earlier depending on her symptoms.  I connected with patient via telemedicine application and verified that I am speaking with the correct person using two identifiers.  I discussed the limitations of evaluation and management by telemedicine and the availability of in person appointments. The patient expressed understanding and agreed to proceed.   I discussed the assessment and treatment plan with the patient. The patient was provided an opportunity to ask questions and all were answered. The patient agreed with the plan and demonstrated an understanding of the instructions.   The patient was advised to call back or seek an in-person evaluation if the symptoms worsen or if the condition fails to improve as anticipated.   I provided 15 minutes of non-face-to-face time during this encounter.

## 2019-05-07 ENCOUNTER — Other Ambulatory Visit: Payer: Self-pay | Admitting: Family Medicine

## 2019-05-07 DIAGNOSIS — J449 Chronic obstructive pulmonary disease, unspecified: Secondary | ICD-10-CM

## 2019-05-07 DIAGNOSIS — R0609 Other forms of dyspnea: Secondary | ICD-10-CM

## 2019-06-01 ENCOUNTER — Encounter: Payer: Self-pay | Admitting: Psychiatry

## 2019-06-01 ENCOUNTER — Ambulatory Visit (INDEPENDENT_AMBULATORY_CARE_PROVIDER_SITE_OTHER): Payer: Medicare Other | Admitting: Psychiatry

## 2019-06-01 ENCOUNTER — Other Ambulatory Visit: Payer: Self-pay

## 2019-06-01 DIAGNOSIS — F3162 Bipolar disorder, current episode mixed, moderate: Secondary | ICD-10-CM

## 2019-06-01 DIAGNOSIS — F413 Other mixed anxiety disorders: Secondary | ICD-10-CM

## 2019-06-01 MED ORDER — HYDROXYZINE HCL 10 MG PO TABS: 10.0000 mg | ORAL_TABLET | Freq: Two times a day (BID) | ORAL | 2 refills | Status: DC

## 2019-06-01 MED ORDER — VENLAFAXINE HCL ER 75 MG PO CP24: 75.0000 mg | ORAL_CAPSULE | Freq: Every day | ORAL | 1 refills | Status: DC

## 2019-06-01 MED ORDER — QUETIAPINE FUMARATE 25 MG PO TABS: 25.0000 mg | ORAL_TABLET | Freq: Every day | ORAL | 2 refills | Status: DC

## 2019-06-01 MED ORDER — LITHIUM CARBONATE 150 MG PO CAPS: 150.0000 mg | ORAL_CAPSULE | Freq: Two times a day (BID) | ORAL | 1 refills | Status: DC

## 2019-06-01 MED ORDER — GABAPENTIN 300 MG PO CAPS: 300.0000 mg | ORAL_CAPSULE | Freq: Every day | ORAL | 1 refills | Status: DC

## 2019-06-01 NOTE — Progress Notes (Signed)
Patient ID: Mackenzie Ward, female   DOB: 11-10-1945, 73 y.o.   MRN: [id number]   Patient is a 73 year old female who was followed up for medication management. She reported that she has been doing well and taking her medications. She went out over the weekend with her sister for some shopping and lunch. She stated that she has been calm on her medications and they are helping her. She does not want to change anything at this time. She reported that she tries to stay indoors most of the time and has been noticing improvement in her behavior. We discussed about her medications in detail. She reported that she sleeps well at night. No other acute symptoms noted at this time.  Plan Continue medications as prescribed. She will follow-up in three months earlier depending on her symptoms.   I connected with patient via telemedicine application and verified that I am speaking with the correct person using two identifiers.  I discussed the limitations of evaluation and management by telemedicine and the availability of in person appointments. The patient expressed understanding and agreed to proceed.   I discussed the assessment and treatment plan with the patient. The patient was provided an opportunity to ask questions and all were answered. The patient agreed with the plan and demonstrated an understanding of the instructions.   The patient was advised to call back or seek an in-person evaluation if the symptoms worsen or if the condition fails to improve as anticipated.   I provided 15 minutes of non-face-to-face time during this encounter.

## 2019-08-21 ENCOUNTER — Other Ambulatory Visit
Admission: RE | Admit: 2019-08-21 | Discharge: 2019-08-21 | Disposition: A | Discharge status: Home / Self Care | Payer: Medicare Other | Source: Ambulatory Visit | Attending: Ophthalmology | Admitting: Ophthalmology

## 2019-08-21 ENCOUNTER — Other Ambulatory Visit: Payer: Self-pay

## 2019-08-21 DIAGNOSIS — Z20828 Contact with and (suspected) exposure to other viral communicable diseases: Secondary | ICD-10-CM | POA: Insufficient documentation

## 2019-08-21 DIAGNOSIS — Z01812 Encounter for preprocedural laboratory examination: Secondary | ICD-10-CM | POA: Insufficient documentation

## 2019-08-22 LAB — SARS CORONAVIRUS 2 (TAT 6-24 HRS): SARS Coronavirus 2: NEGATIVE

## 2019-08-23 NOTE — Anesthesia Preprocedure Evaluation (Addendum)
Anesthesia Evaluation  Patient identified by MRN, date of birth, ID band Patient awake    Reviewed: Allergy & Precautions, NPO status , Patient's Chart, lab work & pertinent test results  History of Anesthesia Complications Negative for: history of anesthetic complications  Airway Mallampati: III   Neck ROM: Full    Dental  (+) Lower Dentures, Upper Dentures   Pulmonary asthma , COPD, Current Smoker (1 ppd) and Patient abstained from smoking.,    Pulmonary exam normal breath sounds clear to auscultation       Cardiovascular Exercise Tolerance: Good negative cardio ROS Normal cardiovascular exam Rhythm:Regular Rate:Normal     Neuro/Psych PSYCHIATRIC DISORDERS Anxiety Depression Bipolar Disorder negative neurological ROS     GI/Hepatic negative GI ROS,   Endo/Other  negative endocrine ROS  Renal/GU negative Renal ROS     Musculoskeletal   Abdominal   Peds  Hematology negative hematology ROS (+)   Anesthesia Other Findings   Reproductive/Obstetrics                            Anesthesia Physical Anesthesia Plan  ASA: III  Anesthesia Plan: MAC   Post-op Pain Management:    Induction: Intravenous  PONV Risk Score and Plan: 1 and TIVA, Midazolam and Treatment may vary due to age or medical condition  Airway Management Planned: Natural Airway  Additional Equipment:   Intra-op Plan:   Post-operative Plan:   Informed Consent: I have reviewed the patients History and Physical, chart, labs and discussed the procedure including the risks, benefits and alternatives for the proposed anesthesia with the patient or authorized representative who has indicated his/her understanding and acceptance.       Plan Discussed with: CRNA  Anesthesia Plan Comments:        Anesthesia Quick Evaluation

## 2019-08-24 ENCOUNTER — Encounter: Payer: Self-pay | Admitting: Psychiatry

## 2019-08-24 ENCOUNTER — Ambulatory Visit (INDEPENDENT_AMBULATORY_CARE_PROVIDER_SITE_OTHER): Payer: Medicare Other | Admitting: Psychiatry

## 2019-08-24 ENCOUNTER — Other Ambulatory Visit: Payer: Self-pay

## 2019-08-24 DIAGNOSIS — F413 Other mixed anxiety disorders: Secondary | ICD-10-CM | POA: Diagnosis not present

## 2019-08-24 DIAGNOSIS — F3162 Bipolar disorder, current episode mixed, moderate: Secondary | ICD-10-CM | POA: Diagnosis not present

## 2019-08-24 DIAGNOSIS — F3170 Bipolar disorder, currently in remission, most recent episode unspecified: Secondary | ICD-10-CM | POA: Insufficient documentation

## 2019-08-24 MED ORDER — GABAPENTIN 300 MG PO CAPS: 300.0000 mg | ORAL_CAPSULE | Freq: Every day | ORAL | 0 refills | Status: DC

## 2019-08-24 MED ORDER — LITHIUM CARBONATE 150 MG PO CAPS: 150.0000 mg | ORAL_CAPSULE | Freq: Two times a day (BID) | ORAL | 0 refills | Status: DC

## 2019-08-24 MED ORDER — HYDROXYZINE HCL 10 MG PO TABS: 10.0000 mg | ORAL_TABLET | Freq: Three times a day (TID) | ORAL | 0 refills | Status: DC | PRN

## 2019-08-24 MED ORDER — VENLAFAXINE HCL ER 75 MG PO CP24: 75.0000 mg | ORAL_CAPSULE | Freq: Every day | ORAL | 0 refills | Status: DC

## 2019-08-24 NOTE — Progress Notes (Signed)
[place] MD OP Progress Note  I connected with  Mackenzie Ward on 08/24/19 by phone and verified that I am speaking with the correct person using two identifiers.   I discussed the limitations of evaluation and management by phone. The patient expressed understanding and agreed to proceed.    08/24/2019 3:04 PM Mackenzie Ward  MRN:  [id number]  Chief Complaint:  " I have been doing fine."  HPI: Patient stated that she has been doing okay although she still concerned about gaining weight which is likely due to being homebound due to Covid restrictions.  She reported that she has been sleeping a lot and takes 1-2 daytime naps.  She remember having some memory issues.  She states that overall her mood has been fine.  She is concerned about forgetting things at times.  She also reported feeling anxious at times and picking at her toenails unknowingly.   Visit Diagnosis:    ICD-10-CM   1. Bipolar disorder in remission ([place])  F31.70   2. Other mixed anxiety disorders  F41.3     Past Psychiatric History: bipolar d/o, anxiety  Past Medical History:  Past Medical History:  Diagnosis Date  . Asthma   . COPD (chronic obstructive pulmonary disease) ([place])   . Depression   . High cholesterol     Past Surgical History:  Procedure Laterality Date  . BREAST SURGERY Right 2013  . COLONOSCOPY  15 years ago  . COLONOSCOPY WITH PROPOFOL N/A 11/10/2018   Procedure: COLONOSCOPY WITH PROPOFOL;  Surgeon: Lin Landsman, MD;  Location: [facility] ENDOSCOPY;  Service: Gastroenterology;  Laterality: N/A;  . EYE SURGERY    . TONSILLECTOMY      Family Psychiatric History: see below  Family History:  Family History  Problem Relation Age of Onset  . Lymphoma Mother        Non Hodgkin  . Dementia Mother   . Alzheimer's disease Mother   . Lymphoma Sister        Non Hodgkin  . Depression Sister   . Heart attack Father   . Alcohol abuse Father   . Dementia Brother   . Dementia Maternal Uncle      Social History:  Social History   Socioeconomic History  . Marital status: Married    Spouse name: Not on file  . Number of children: Not on file  . Years of education: Not on file  . Highest education level: Some college, no degree  Occupational History  . Occupation: retired  Tobacco Use  . Smoking status: Current Every Day Smoker    Packs/day: 1.00    Years: 45.00    Pack years: 45.00    Types: Cigarettes    Last attempt to quit: 11/21/2015    Years since quitting: 3.7  . Smokeless tobacco: Never Used  Substance and Sexual Activity  . Alcohol use: No    Alcohol/week: 0.0 standard drinks  . Drug use: No  . Sexual activity: Not Currently  Other Topics Concern  . Not on file  Social History Narrative   Goes to dinner with friends often, goes to see her mom at nursing home on a regular basis    Social Determinants of Health   Financial Resource Strain:   . Difficulty of Paying Living Expenses: Not on file  Food Insecurity:   . Worried About Charity fundraiser in the Last Year: Not on file  . Ran Out of Food in the Last Year: Not on  file  Transportation Needs:   . Film/video editor (Medical): Not on file  . Lack of Transportation (Non-Medical): Not on file  Physical Activity:   . Days of Exercise per Week: Not on file  . Minutes of Exercise per Session: Not on file  Stress:   . Feeling of Stress : Not on file  Social Connections: Unknown  . Frequency of Communication with Friends and Family: Not on file  . Frequency of Social Gatherings with Friends and Family: Not on file  . Attends Religious Services: Never  . Active Member of Clubs or Organizations: Not on file  . Attends Archivist Meetings: Not on file  . Marital Status: Not on file    Allergies: No Known Allergies  Metabolic Disorder Labs: Lab Results  Component Value Date   HGBA1C 5.5 11/27/2018   MPG 111 11/27/2018   MPG 111 10/15/2017   No results found for: PROLACTIN Lab  Results  Component Value Date   CHOL 275 (H) 11/27/2018   TRIG 240 (H) 11/27/2018   HDL 56 11/27/2018   CHOLHDL 4.9 11/27/2018   VLDL 27 11/05/2016   LDLCALC 178 (H) 11/27/2018   LDLCALC 145 (H) 10/15/2017   Lab Results  Component Value Date   TSH 4.47 11/27/2018   TSH 3.37 10/15/2017    Therapeutic Level Labs: No results found for: LITHIUM No results found for: VALPROATE No components found for:  CBMZ  Current Medications: Current Outpatient Medications  Medication Sig Dispense Refill  . albuterol (PROVENTIL) (2.5 MG/3ML) 0.083% nebulizer solution Take 3 mLs (2.5 mg total) by nebulization every 6 (six) hours as needed for wheezing or shortness of breath. 60 mL 1  . gabapentin (NEURONTIN) 300 MG capsule Take 1 capsule (300 mg total) by mouth at bedtime. 90 capsule 1  . hydrOXYzine (ATARAX/VISTARIL) 10 MG tablet Take 1 tablet (10 mg total) by mouth 2 (two) times daily. 180 tablet 2  . lithium carbonate 150 MG capsule Take 1 capsule (150 mg total) by mouth 2 (two) times daily with a meal. 180 capsule 1  . QUEtiapine (SEROQUEL) 25 MG tablet Take 1 tablet (25 mg total) by mouth at bedtime. 90 tablet 2  . SPIRIVA HANDIHALER 18 MCG inhalation capsule Place 1 capsule (18 mcg total) into inhaler and inhale daily. 30 capsule 2  . venlafaxine XR (EFFEXOR XR) 75 MG 24 hr capsule Take 1 capsule (75 mg total) by mouth daily with breakfast. 90 capsule 1  . Vitamin D, Ergocalciferol, (DRISDOL) 1.25 MG (50000 UT) CAPS capsule Take 50,000 Units by mouth every 7 (seven) days.     No current facility-administered medications for this visit.     Musculoskeletal: Strength & Muscle Tone: unable to assess due to telemed visit Gait & Station: unable to assess due to telemed visit Patient leans: unable to assess due to telemed visit  Psychiatric Specialty Exam: Review of Systems  There were no vitals taken for this visit.There is no height or weight on file to calculate BMI.  General Appearance:  Unable to assess due to phone visit  Eye Contact: Unable to assess due to phone visit  Speech:  Clear and Coherent and Normal Rate  Volume:  Normal  Mood:  Euthymic  Affect:  Congruent  Thought Process:  Goal Directed, Linear and Descriptions of Associations: Intact  Orientation:  Full (Time, Place, and Person)  Thought Content: Logical   Suicidal Thoughts:  No  Homicidal Thoughts:  No  Memory:  Recent;  Good Remote;   Good  Judgement:  Fair  Insight:  Fair  Psychomotor Activity:  Normal  Concentration:  Concentration: Good and Attention Span: Fair  Recall:  [place] of Knowledge: Good  Language: Good  Akathisia:  Negative  Handed:  Right  AIMS (if indicated): not done  Assets:  Communication Skills Desire for Improvement Financial Resources/Insurance Housing Social Support  ADL's:  Intact  Cognition: WNL  Sleep:  Good   Screenings: PHQ2-9     Clinical Support from 10/28/2018 in [facility] Office Visit from 07/31/2018 in [place] from 10/22/2017 in [facility] Office Visit from 03/06/2016 in [facility] Office Visit from 11/08/2015 in [place]  PHQ-2 Total Score  0  0  3  4  2   PHQ-9 Total Score  -  0  6  16  10        Assessment and Plan: 73 year old female with history of bipolar disorder and anxiety now reporting excessive sleepiness and some weight gain.  Patient is agreeable to trying without Seroquel as it may be attributing to her sleepiness as well as weight gain.  Patient also reported feeling anxious and was agreeable to increasing the dose of hydroxyzine to 3 times daily.  Potential side effects of medication and risks vs benefits of treatment vs non-treatment were explained and discussed. All questions were answered.   1. Bipolar disorder in remission ([place])  - Continue lithium carbonate 150 MG capsule; Take 1 capsule (150 mg total) by mouth 2 (two) times  daily with a meal.  Dispense: 180 capsule; Refill: 0 -Continue venlafaxine XR (EFFEXOR XR) 75 MG 24 hr capsule; Take 1 capsule (75 mg total) by mouth daily with breakfast.  Dispense: 90 capsule; Refill: 0 - Continue gabapentin (NEURONTIN) 300 MG capsule; Take 1 capsule (300 mg total) by mouth at bedtime.  Dispense: 90 capsule; Refill: 0  2. Other mixed anxiety disorders  - Increase hydrOXYzine (ATARAX/VISTARIL) 10 MG tablet; Take 1 tablet (10 mg total) by mouth 3 (three) times daily as needed for anxiety.  Dispense: 270 tablet; Refill: 0  F/up in 2 months.   N[place]ada Crane, MD 08/24/2019, 3:04 PM

## 2019-08-25 NOTE — Discharge Instructions (Signed)

## 2019-08-26 ENCOUNTER — Encounter: Payer: Self-pay | Admitting: Ophthalmology

## 2019-08-26 ENCOUNTER — Ambulatory Visit
Admission: RE | Admit: 2019-08-26 | Discharge: 2019-08-26 | Disposition: A | Discharge status: Home / Self Care | Payer: Medicare Other | Attending: Ophthalmology | Admitting: Ophthalmology

## 2019-08-26 ENCOUNTER — Encounter
Admission: RE | Disposition: A | Discharge status: Home / Self Care | Payer: Self-pay | Source: Home / Self Care | Attending: Ophthalmology

## 2019-08-26 ENCOUNTER — Ambulatory Visit: Payer: Medicare Other | Admitting: Anesthesiology

## 2019-08-26 ENCOUNTER — Other Ambulatory Visit: Payer: Self-pay

## 2019-08-26 DIAGNOSIS — E7800 Pure hypercholesterolemia, unspecified: Secondary | ICD-10-CM | POA: Insufficient documentation

## 2019-08-26 DIAGNOSIS — F17200 Nicotine dependence, unspecified, uncomplicated: Secondary | ICD-10-CM | POA: Diagnosis not present

## 2019-08-26 DIAGNOSIS — R0601 Orthopnea: Secondary | ICD-10-CM | POA: Insufficient documentation

## 2019-08-26 DIAGNOSIS — F319 Bipolar disorder, unspecified: Secondary | ICD-10-CM | POA: Insufficient documentation

## 2019-08-26 DIAGNOSIS — M109 Gout, unspecified: Secondary | ICD-10-CM | POA: Diagnosis present

## 2019-08-26 DIAGNOSIS — J449 Chronic obstructive pulmonary disease, unspecified: Secondary | ICD-10-CM | POA: Diagnosis not present

## 2019-08-26 DIAGNOSIS — F419 Anxiety disorder, unspecified: Secondary | ICD-10-CM | POA: Diagnosis present

## 2019-08-26 DIAGNOSIS — H2512 Age-related nuclear cataract, left eye: Secondary | ICD-10-CM | POA: Diagnosis present

## 2019-08-26 HISTORY — PX: CATARACT EXTRACTION W/PHACO: SHX586

## 2019-08-26 SURGERY — PHACOEMULSIFICATION, CATARACT, WITH IOL INSERTION
Anesthesia: Monitor Anesthesia Care | Site: Eye | Laterality: Left

## 2019-08-26 MED ORDER — TETRACAINE HCL 0.5 % OP SOLN
1.0000 [drp] | OPHTHALMIC | Status: DC | PRN
  Administered 2019-08-26 (×2): 1 [drp] via OPHTHALMIC

## 2019-08-26 MED ORDER — ONDANSETRON HCL 4 MG/2ML IJ SOLN: 4.0000 mg | Freq: Once | INTRAMUSCULAR | Status: DC | PRN

## 2019-08-26 MED ORDER — MOXIFLOXACIN HCL 0.5 % OP SOLN
1.0000 [drp] | OPHTHALMIC | Status: DC | PRN
  Administered 2019-08-26 (×3): 1 [drp] via OPHTHALMIC

## 2019-08-26 MED ORDER — ACETAMINOPHEN 160 MG/5ML PO SOLN: 325.0000 mg | ORAL | Status: DC | PRN

## 2019-08-26 MED ORDER — LIDOCAINE HCL (PF) 2 % IJ SOLN
INTRAOCULAR | Status: DC | PRN
  Administered 2019-08-26: 1 mL

## 2019-08-26 MED ORDER — CEFUROXIME OPHTHALMIC INJECTION 1 MG/0.1 ML
INJECTION | OPHTHALMIC | Status: DC | PRN
  Administered 2019-08-26: 0.1 mL via OPHTHALMIC

## 2019-08-26 MED ORDER — NA HYALUR & NA CHOND-NA HYALUR 0.4-0.35 ML IO KIT
PACK | INTRAOCULAR | Status: DC | PRN
  Administered 2019-08-26: 1 mL via INTRAOCULAR

## 2019-08-26 MED ORDER — EPINEPHRINE PF 1 MG/ML IJ SOLN
INTRAOCULAR | Status: DC | PRN
  Administered 2019-08-26: 11:00:00 53 mL via OPHTHALMIC

## 2019-08-26 MED ORDER — MIDAZOLAM HCL 2 MG/2ML IJ SOLN
INTRAMUSCULAR | Status: DC | PRN
  Administered 2019-08-26: 1 mg via INTRAVENOUS

## 2019-08-26 MED ORDER — FENTANYL CITRATE (PF) 100 MCG/2ML IJ SOLN
INTRAMUSCULAR | Status: DC | PRN
  Administered 2019-08-26: 50 ug via INTRAVENOUS

## 2019-08-26 MED ORDER — BRIMONIDINE TARTRATE-TIMOLOL 0.2-0.5 % OP SOLN
OPHTHALMIC | Status: DC | PRN
  Administered 2019-08-26: 1 [drp] via OPHTHALMIC

## 2019-08-26 MED ORDER — ARMC OPHTHALMIC DILATING DROPS
1.0000 "application " | OPHTHALMIC | Status: DC | PRN
  Administered 2019-08-26 (×2): 1 via OPHTHALMIC

## 2019-08-26 MED ORDER — ACETAMINOPHEN 325 MG PO TABS: 650.0000 mg | ORAL_TABLET | Freq: Once | ORAL | Status: DC | PRN

## 2019-08-26 SURGICAL SUPPLY — 16 items
CANNULA ANT/CHMB 27G (MISCELLANEOUS) ×1 IMPLANT
CANNULA ANT/CHMB 27GA (MISCELLANEOUS) ×3 IMPLANT
GLOVE SURG LX 7.5 STRW (GLOVE) ×2
GLOVE SURG LX STRL 7.5 STRW (GLOVE) ×1 IMPLANT
GLOVE SURG TRIUMPH 8.0 PF LTX (GLOVE) ×3 IMPLANT
GOWN STRL REUS W/ TWL LRG LVL3 (GOWN DISPOSABLE) ×2 IMPLANT
GOWN STRL REUS W/TWL LRG LVL3 (GOWN DISPOSABLE) ×4
LENS IOL TECNIS ITEC 28.5 (Intraocular Lens) ×2 IMPLANT
MARKER SKIN DUAL TIP RULER LAB (MISCELLANEOUS) ×3 IMPLANT
PACK CATARACT BRASINGTON (MISCELLANEOUS) ×3 IMPLANT
PACK EYE AFTER SURG (MISCELLANEOUS) ×3 IMPLANT
PACK OPTHALMIC (MISCELLANEOUS) ×3 IMPLANT
SYR 3ML LL SCALE MARK (SYRINGE) ×3 IMPLANT
SYR TB 1ML LUER SLIP (SYRINGE) ×3 IMPLANT
WATER STERILE IRR 500ML POUR (IV SOLUTION) ×3 IMPLANT
WIPE NON LINTING 3.25X3.25 (MISCELLANEOUS) ×3 IMPLANT

## 2019-08-26 NOTE — Op Note (Signed)
OPERATIVE NOTE  Mackenzie Ward [id number] 08/26/2019   PREOPERATIVE DIAGNOSIS:  Nuclear sclerotic cataract left eye. H25.12   POSTOPERATIVE DIAGNOSIS:    Nuclear sclerotic cataract left eye.     PROCEDURE:  Phacoemusification with posterior chamber intraocular lens placement of the left eye  Ultrasound time: Procedure(s): CATARACT EXTRACTION PHACO AND INTRAOCULAR LENS PLACEMENT (IOC) LEFT  4.45    00:40.5    11.0% (Left)  LENS:   Implant Name Type Inv. Item Serial No. Manufacturer Lot No. LRB No. Used Action  LENS IOL DIOP 28.5 - [id number] Intraocular Lens LENS IOL DIOP 28.5 [id number]  Left 1 Implanted      SURGEON:  Wyonia Hough, MD   ANESTHESIA:  Topical with tetracaine drops and 2% Xylocaine jelly, augmented with 1% preservative-free intracameral lidocaine.    COMPLICATIONS:  None.   DESCRIPTION OF PROCEDURE:  The patient was identified in the holding room and transported to the operating room and placed in the supine position under the operating microscope.  The left eye was identified as the operative eye and it was prepped and draped in the usual sterile ophthalmic fashion.   A 1 millimeter clear-corneal paracentesis was made at the 1:30 position.  0.5 ml of preservative-free 1% lidocaine was injected into the anterior chamber.  The anterior chamber was filled with Viscoat viscoelastic.  A 2.4 millimeter keratome was used to make a near-clear corneal incision at the 10:30 position.  .  A curvilinear capsulorrhexis was made with a cystotome and capsulorrhexis forceps.  Balanced salt solution was used to hydrodissect and hydrodelineate the nucleus.   Phacoemulsification was then used in stop and chop fashion to remove the lens nucleus and epinucleus.  The remaining cortex was then removed using the irrigation and aspiration handpiece. Provisc was then placed into the capsular bag to distend it for lens placement.  A lens was then injected into the capsular bag.   The remaining viscoelastic was aspirated.   Wounds were hydrated with balanced salt solution.  The anterior chamber was inflated to a physiologic pressure with balanced salt solution.  No wound leaks were noted. Cefuroxime 0.1 ml of a 10mg /ml solution was injected into the anterior chamber for a dose of 1 mg of intracameral antibiotic at the completion of the case.   Timolol and Brimonidine drops were applied to the eye.  The patient was taken to the recovery room in stable condition without complications of anesthesia or surgery.  Mackenzie Ward 08/26/2019, 10:56 AM

## 2019-08-26 NOTE — H&P (Signed)

## 2019-08-26 NOTE — Anesthesia Postprocedure Evaluation (Signed)
Anesthesia Post Note  Patient: Mackenzie Ward  Procedure(s) Performed: CATARACT EXTRACTION PHACO AND INTRAOCULAR LENS PLACEMENT (IOC) LEFT  4.45    00:40.5    11.0% (Left Eye)     Patient location during evaluation: PACU Anesthesia Type: MAC Level of consciousness: awake and alert, oriented and patient cooperative Pain management: pain level controlled Vital Signs Assessment: post-procedure vital signs reviewed and stable Respiratory status: spontaneous breathing, nonlabored ventilation and respiratory function stable Cardiovascular status: blood pressure returned to baseline and stable Postop Assessment: adequate PO intake Anesthetic complications: no    Darrin Nipper

## 2019-08-26 NOTE — Transfer of Care (Signed)
Immediate Anesthesia Transfer of Care Note  Patient: Mackenzie Ward  Procedure(s) Performed: CATARACT EXTRACTION PHACO AND INTRAOCULAR LENS PLACEMENT (IOC) LEFT  4.45    00:40.5    11.0% (Left Eye)  Patient Location: PACU  Anesthesia Type: MAC  Level of Consciousness: awake, alert  and patient cooperative  Airway and Oxygen Therapy: Patient Spontanous Breathing and Patient connected to supplemental oxygen  Post-op Assessment: Post-op Vital signs reviewed, Patient's Cardiovascular Status Stable, Respiratory Function Stable, Patent Airway and No signs of Nausea or vomiting  Post-op Vital Signs: Reviewed and stable  Complications: No apparent anesthesia complications

## 2019-08-26 NOTE — Anesthesia Procedure Notes (Signed)
Procedure Name: MAC Date/Time: 08/26/2019 10:34 AM Performed by: Vanetta Shawl, CRNA Pre-anesthesia Checklist: Patient identified, Emergency Drugs available, Suction available, Timeout performed and Patient being monitored Patient Re-evaluated:Patient Re-evaluated prior to induction Oxygen Delivery Method: Nasal cannula Placement Confirmation: positive ETCO2

## 2019-08-27 ENCOUNTER — Encounter: Payer: Self-pay | Admitting: *Deleted

## 2019-08-31 ENCOUNTER — Ambulatory Visit: Payer: Medicare Other | Admitting: Psychiatry

## 2019-09-10 ENCOUNTER — Other Ambulatory Visit: Payer: Self-pay | Admitting: Family Medicine

## 2019-09-10 DIAGNOSIS — R0609 Other forms of dyspnea: Secondary | ICD-10-CM

## 2019-09-10 DIAGNOSIS — J449 Chronic obstructive pulmonary disease, unspecified: Secondary | ICD-10-CM

## 2019-09-22 ENCOUNTER — Other Ambulatory Visit: Payer: Self-pay

## 2019-09-22 ENCOUNTER — Encounter: Payer: Self-pay | Admitting: Ophthalmology

## 2019-09-28 ENCOUNTER — Other Ambulatory Visit
Admission: RE | Admit: 2019-09-28 | Discharge: 2019-09-28 | Disposition: A | Discharge status: Home / Self Care | Payer: Medicare PPO | Source: Ambulatory Visit | Attending: Ophthalmology | Admitting: Ophthalmology

## 2019-09-28 DIAGNOSIS — Z01812 Encounter for preprocedural laboratory examination: Secondary | ICD-10-CM | POA: Insufficient documentation

## 2019-09-28 DIAGNOSIS — Z20822 Contact with and (suspected) exposure to covid-19: Secondary | ICD-10-CM | POA: Insufficient documentation

## 2019-09-28 LAB — SARS CORONAVIRUS 2 (TAT 6-24 HRS): SARS Coronavirus 2: NEGATIVE

## 2019-09-29 NOTE — Discharge Instructions (Signed)

## 2019-09-30 ENCOUNTER — Ambulatory Visit: Payer: Medicare PPO | Admitting: Anesthesiology

## 2019-09-30 ENCOUNTER — Encounter
Admission: RE | Disposition: A | Discharge status: Home / Self Care | Payer: Self-pay | Source: Home / Self Care | Attending: Ophthalmology

## 2019-09-30 ENCOUNTER — Ambulatory Visit
Admission: RE | Admit: 2019-09-30 | Discharge: 2019-09-30 | Disposition: A | Discharge status: Home / Self Care | Payer: Medicare PPO | Attending: Ophthalmology | Admitting: Ophthalmology

## 2019-09-30 ENCOUNTER — Other Ambulatory Visit: Payer: Self-pay

## 2019-09-30 ENCOUNTER — Encounter: Payer: Self-pay | Admitting: Ophthalmology

## 2019-09-30 DIAGNOSIS — H2511 Age-related nuclear cataract, right eye: Secondary | ICD-10-CM | POA: Diagnosis not present

## 2019-09-30 DIAGNOSIS — Z79899 Other long term (current) drug therapy: Secondary | ICD-10-CM | POA: Insufficient documentation

## 2019-09-30 DIAGNOSIS — H25811 Combined forms of age-related cataract, right eye: Secondary | ICD-10-CM | POA: Diagnosis not present

## 2019-09-30 DIAGNOSIS — F419 Anxiety disorder, unspecified: Secondary | ICD-10-CM | POA: Insufficient documentation

## 2019-09-30 DIAGNOSIS — F319 Bipolar disorder, unspecified: Secondary | ICD-10-CM | POA: Insufficient documentation

## 2019-09-30 DIAGNOSIS — Z6831 Body mass index (BMI) 31.0-31.9, adult: Secondary | ICD-10-CM | POA: Diagnosis not present

## 2019-09-30 DIAGNOSIS — F17210 Nicotine dependence, cigarettes, uncomplicated: Secondary | ICD-10-CM | POA: Diagnosis not present

## 2019-09-30 DIAGNOSIS — G629 Polyneuropathy, unspecified: Secondary | ICD-10-CM | POA: Insufficient documentation

## 2019-09-30 DIAGNOSIS — J449 Chronic obstructive pulmonary disease, unspecified: Secondary | ICD-10-CM | POA: Diagnosis not present

## 2019-09-30 HISTORY — PX: CATARACT EXTRACTION W/PHACO: SHX586

## 2019-09-30 SURGERY — PHACOEMULSIFICATION, CATARACT, WITH IOL INSERTION
Anesthesia: Monitor Anesthesia Care | Site: Eye | Laterality: Right

## 2019-09-30 MED ORDER — NA HYALUR & NA CHOND-NA HYALUR 0.4-0.35 ML IO KIT
PACK | INTRAOCULAR | Status: DC | PRN
  Administered 2019-09-30: 1 mL via INTRAOCULAR

## 2019-09-30 MED ORDER — CEFUROXIME OPHTHALMIC INJECTION 1 MG/0.1 ML
INJECTION | OPHTHALMIC | Status: DC | PRN
  Administered 2019-09-30: 0.1 mL via INTRACAMERAL

## 2019-09-30 MED ORDER — LACTATED RINGERS IV SOLN: 100.0000 mL/h | INTRAVENOUS | Status: DC

## 2019-09-30 MED ORDER — FENTANYL CITRATE (PF) 100 MCG/2ML IJ SOLN
INTRAMUSCULAR | Status: DC | PRN
  Administered 2019-09-30: 50 ug via INTRAVENOUS

## 2019-09-30 MED ORDER — EPINEPHRINE PF 1 MG/ML IJ SOLN
INTRAOCULAR | Status: DC | PRN
  Administered 2019-09-30: 57 mL via OPHTHALMIC

## 2019-09-30 MED ORDER — ARMC OPHTHALMIC DILATING DROPS
1.0000 "application " | OPHTHALMIC | Status: DC | PRN
  Administered 2019-09-30 (×3): 1 via OPHTHALMIC

## 2019-09-30 MED ORDER — TETRACAINE HCL 0.5 % OP SOLN
1.0000 [drp] | OPHTHALMIC | Status: DC | PRN
  Administered 2019-09-30 (×3): 1 [drp] via OPHTHALMIC

## 2019-09-30 MED ORDER — LIDOCAINE HCL (PF) 2 % IJ SOLN
INTRAOCULAR | Status: DC | PRN
  Administered 2019-09-30: 1 mL

## 2019-09-30 MED ORDER — MOXIFLOXACIN HCL 0.5 % OP SOLN
1.0000 [drp] | OPHTHALMIC | Status: DC | PRN
  Administered 2019-09-30 (×3): 1 [drp] via OPHTHALMIC

## 2019-09-30 MED ORDER — LACTATED RINGERS IV SOLN: INTRAVENOUS | Status: DC

## 2019-09-30 MED ORDER — MIDAZOLAM HCL 2 MG/2ML IJ SOLN
INTRAMUSCULAR | Status: DC | PRN
  Administered 2019-09-30: 1.5 mg via INTRAVENOUS

## 2019-09-30 MED ORDER — BRIMONIDINE TARTRATE-TIMOLOL 0.2-0.5 % OP SOLN
OPHTHALMIC | Status: DC | PRN
  Administered 2019-09-30: 1 [drp] via OPHTHALMIC

## 2019-09-30 SURGICAL SUPPLY — 16 items
CANNULA ANT/CHMB 27G (MISCELLANEOUS) ×1 IMPLANT
CANNULA ANT/CHMB 27GA (MISCELLANEOUS) ×3 IMPLANT
GLOVE SURG LX 7.5 STRW (GLOVE) ×2
GLOVE SURG LX STRL 7.5 STRW (GLOVE) ×1 IMPLANT
GLOVE SURG TRIUMPH 8.0 PF LTX (GLOVE) ×3 IMPLANT
GOWN STRL REUS W/ TWL LRG LVL3 (GOWN DISPOSABLE) ×2 IMPLANT
GOWN STRL REUS W/TWL LRG LVL3 (GOWN DISPOSABLE) ×4
LENS IOL TECNIS ITEC 25.5 (Intraocular Lens) ×2 IMPLANT
MARKER SKIN DUAL TIP RULER LAB (MISCELLANEOUS) ×3 IMPLANT
PACK CATARACT BRASINGTON (MISCELLANEOUS) ×3 IMPLANT
PACK EYE AFTER SURG (MISCELLANEOUS) ×3 IMPLANT
PACK OPTHALMIC (MISCELLANEOUS) ×3 IMPLANT
SYR 3ML LL SCALE MARK (SYRINGE) ×3 IMPLANT
SYR TB 1ML LUER SLIP (SYRINGE) ×3 IMPLANT
WATER STERILE IRR 500ML POUR (IV SOLUTION) ×3 IMPLANT
WIPE NON LINTING 3.25X3.25 (MISCELLANEOUS) ×3 IMPLANT

## 2019-09-30 NOTE — Transfer of Care (Signed)
Immediate Anesthesia Transfer of Care Note  Patient: Mackenzie Ward  Procedure(s) Performed: CATARACT EXTRACTION PHACO AND INTRAOCULAR LENS PLACEMENT (IOC) RIGHT 4.05  00:44.4  9.1%  (Right Eye)  Patient Location: PACU  Anesthesia Type: MAC  Level of Consciousness: awake, alert  and patient cooperative  Airway and Oxygen Therapy: Patient Spontanous Breathing and Patient connected to supplemental oxygen  Post-op Assessment: Post-op Vital signs reviewed, Patient's Cardiovascular Status Stable, Respiratory Function Stable, Patent Airway and No signs of Nausea or vomiting  Post-op Vital Signs: Reviewed and stable  Complications: No apparent anesthesia complications

## 2019-09-30 NOTE — Anesthesia Postprocedure Evaluation (Signed)
Anesthesia Post Note  Patient: Mackenzie Ward  Procedure(s) Performed: CATARACT EXTRACTION PHACO AND INTRAOCULAR LENS PLACEMENT (IOC) RIGHT 4.05  00:44.4  9.1%  (Right Eye)     Patient location during evaluation: PACU Anesthesia Type: MAC Level of consciousness: awake and alert Pain management: pain level controlled Vital Signs Assessment: post-procedure vital signs reviewed and stable Respiratory status: spontaneous breathing, nonlabored ventilation, respiratory function stable and patient connected to nasal cannula oxygen Cardiovascular status: stable and blood pressure returned to baseline Postop Assessment: no apparent nausea or vomiting Anesthetic complications: no    Marifer Hurd

## 2019-09-30 NOTE — H&P (Signed)

## 2019-09-30 NOTE — Anesthesia Preprocedure Evaluation (Addendum)
Anesthesia Evaluation  Patient identified by MRN, date of birth, ID band Patient awake    Reviewed: Allergy & Precautions, NPO status , Patient's Chart, lab work & pertinent test results  History of Anesthesia Complications Negative for: history of anesthetic complications  Airway Mallampati: III  TM Distance: >3 FB Neck ROM: Full    Dental no notable dental hx. (+) Lower Dentures, Upper Dentures   Pulmonary COPD (mild),  COPD inhaler, Current Smoker and Patient abstained from smoking.,  Chronic cough   Pulmonary exam normal breath sounds clear to auscultation       Cardiovascular Exercise Tolerance: Good negative cardio ROS Normal cardiovascular exam Rhythm:Regular Rate:Normal     Neuro/Psych PSYCHIATRIC DISORDERS Anxiety Depression Bipolar Disorder RLegS and feet neuropathy; negative neurological ROS     GI/Hepatic negative GI ROS, Neg liver ROS,   Endo/Other  Morbid obesity (bmi=31)  Renal/GU negative Renal ROS  negative genitourinary   Musculoskeletal gout   Abdominal   Peds  Hematology negative hematology ROS (+)   Anesthesia Other Findings Covid: NEG.  Had ECCE on 12/16.  Reproductive/Obstetrics                            Anesthesia Physical  Anesthesia Plan  ASA: III  Anesthesia Plan: MAC   Post-op Pain Management:    Induction: Intravenous  PONV Risk Score and Plan: 2 and TIVA and Midazolam  Airway Management Planned: Natural Airway  Additional Equipment:   Intra-op Plan:   Post-operative Plan:   Informed Consent: I have reviewed the patients History and Physical, chart, labs and discussed the procedure including the risks, benefits and alternatives for the proposed anesthesia with the patient or authorized representative who has indicated his/her understanding and acceptance.       Plan Discussed with: CRNA  Anesthesia Plan Comments:          Anesthesia Quick Evaluation

## 2019-09-30 NOTE — Op Note (Signed)
LOCATION:  [place]   PREOPERATIVE DIAGNOSIS:    Nuclear sclerotic cataract right eye. H25.11   POSTOPERATIVE DIAGNOSIS:  Nuclear sclerotic cataract right eye.     PROCEDURE:  Phacoemusification with posterior chamber intraocular lens placement of the right eye   ULTRASOUND TIME: Procedure(s): CATARACT EXTRACTION PHACO AND INTRAOCULAR LENS PLACEMENT (IOC) RIGHT 4.05  00:44.4  9.1%  (Right)  LENS:   Implant Name Type Inv. Item Serial No. Manufacturer Lot No. LRB No. Used Action  LENS IOL DIOP 25.5 - [id number] Intraocular Lens LENS IOL DIOP 25.5 [id number]  Right 1 Implanted         SURGEON:  Wyonia Hough, MD   ANESTHESIA:  Topical with tetracaine drops and 2% Xylocaine jelly, augmented with 1% preservative-free intracameral lidocaine.    COMPLICATIONS:  None.   DESCRIPTION OF PROCEDURE:  The patient was identified in the holding room and transported to the operating room and placed in the supine position under the operating microscope.  The right eye was identified as the operative eye and it was prepped and draped in the usual sterile ophthalmic fashion.   A 1 millimeter clear-corneal paracentesis was made at the 12:00 position.  0.5 ml of preservative-free 1% lidocaine was injected into the anterior chamber. The anterior chamber was filled with Viscoat viscoelastic.  A 2.4 millimeter keratome was used to make a near-clear corneal incision at the 9:00 position.  A curvilinear capsulorrhexis was made with a cystotome and capsulorrhexis forceps.  Balanced salt solution was used to hydrodissect and hydrodelineate the nucleus.   Phacoemulsification was then used in stop and chop fashion to remove the lens nucleus and epinucleus.  The remaining cortex was then removed using the irrigation and aspiration handpiece. Provisc was then placed into the capsular bag to distend it for lens placement.  A lens was then injected into the capsular bag.  The remaining  viscoelastic was aspirated.   Wounds were hydrated with balanced salt solution.  The anterior chamber was inflated to a physiologic pressure with balanced salt solution.  No wound leaks were noted. Cefuroxime 0.1 ml of a 10mg /ml solution was injected into the anterior chamber for a dose of 1 mg of intracameral antibiotic at the completion of the case.   Timolol and Brimonidine drops were applied to the eye.  The patient was taken to the recovery room in stable condition without complications of anesthesia or surgery.   Mackenzie Ward 09/30/2019, 12:11 PM

## 2019-09-30 NOTE — Anesthesia Procedure Notes (Signed)
Procedure Name: MAC Performed by: Ernestyne Caldwell, CRNA Pre-anesthesia Checklist: Patient identified, Emergency Drugs available, Suction available, Timeout performed and Patient being monitored Patient Re-evaluated:Patient Re-evaluated prior to induction Oxygen Delivery Method: Nasal cannula Placement Confirmation: positive ETCO2       

## 2019-10-01 ENCOUNTER — Encounter: Payer: Self-pay | Admitting: *Deleted

## 2019-10-21 ENCOUNTER — Other Ambulatory Visit: Payer: Self-pay

## 2019-10-21 ENCOUNTER — Ambulatory Visit (INDEPENDENT_AMBULATORY_CARE_PROVIDER_SITE_OTHER): Payer: Medicare PPO | Admitting: Psychiatry

## 2019-10-21 ENCOUNTER — Encounter: Payer: Self-pay | Admitting: Psychiatry

## 2019-10-21 DIAGNOSIS — F413 Other mixed anxiety disorders: Secondary | ICD-10-CM

## 2019-10-21 DIAGNOSIS — F3170 Bipolar disorder, currently in remission, most recent episode unspecified: Secondary | ICD-10-CM

## 2019-10-21 MED ORDER — GABAPENTIN 300 MG PO CAPS: 300.0000 mg | ORAL_CAPSULE | Freq: Every day | ORAL | 0 refills | Status: DC

## 2019-10-21 MED ORDER — HYDROXYZINE HCL 10 MG PO TABS: 10.0000 mg | ORAL_TABLET | Freq: Three times a day (TID) | ORAL | 0 refills | Status: DC | PRN

## 2019-10-21 MED ORDER — QUETIAPINE FUMARATE 25 MG PO TABS: 25.0000 mg | ORAL_TABLET | Freq: Every day | ORAL | 2 refills | Status: DC

## 2019-10-21 MED ORDER — VENLAFAXINE HCL ER 75 MG PO CP24: 75.0000 mg | ORAL_CAPSULE | Freq: Every day | ORAL | 0 refills | Status: DC

## 2019-10-21 MED ORDER — LITHIUM CARBONATE 150 MG PO CAPS: 150.0000 mg | ORAL_CAPSULE | Freq: Two times a day (BID) | ORAL | 0 refills | Status: DC

## 2019-10-21 NOTE — Progress Notes (Signed)
BH MD/PA/NP OP Progress Note  I connected with  Mackenzie Ward on 10/21/19 by a video enabled telemedicine application and verified that I am speaking with the correct person using two identifiers.   I discussed the limitations of evaluation and management by telemedicine. The patient expressed understanding and agreed to proceed.    10/21/2019 8:47 AM Mackenzie Ward  MRN:  [id number]  Chief Complaint: " I am doing well."  HPI: Patient stated that she has been doing well.  She informed that her energy levels are not likely used to be and she mainly attributes it to her advanced age.  She stated that due to COVID-19 she has not been going out of her house much.  She stated that she does not mind as she prefers to be homebody.  Her husband takes care of all the groceries and outside chores.  She does feel bad that it has added to her husband's list of work however she prefers to keep it this way.  She stated that she does not plan on taking the COVID-19 vaccination as she does not go out of the house however her husband is on the waiting list that he will be getting it for sure. She takes hydroxyzine 10 mg 3 times daily and also uses Seroquel at bedtime for sleep.  She stated that all the medicines are helpful and she can tell a big difference when she misses a dose here and there.  She denied any acute issues or concerns at this time.  Visit Diagnosis:    ICD-10-CM   1. Bipolar disorder in remission (HCC)  F31.70 gabapentin (NEURONTIN) 300 MG capsule    lithium carbonate 150 MG capsule    venlafaxine XR (EFFEXOR XR) 75 MG 24 hr capsule  2. Other mixed anxiety disorders  F41.3 hydrOXYzine (ATARAX/VISTARIL) 10 MG tablet    Past Psychiatric History: Bipolar d/o, anxiety d/o  Past Medical History:  Past Medical History:  Diagnosis Date  . Asthma   . COPD (chronic obstructive pulmonary disease) ([place])   . Depression   . High cholesterol     Past Surgical History:  Procedure  Laterality Date  . BREAST SURGERY Right 2013  . CATARACT EXTRACTION W/PHACO Left 08/26/2019   Procedure: CATARACT EXTRACTION PHACO AND INTRAOCULAR LENS PLACEMENT (IOC) LEFT  4.45    00:40.5    11.0%;  Surgeon: Leandrew Koyanagi, MD;  Location: [facility];  Service: Ophthalmology;  Laterality: Left;  . CATARACT EXTRACTION W/PHACO Right 09/30/2019   Procedure: CATARACT EXTRACTION PHACO AND INTRAOCULAR LENS PLACEMENT (IOC) RIGHT 4.05  00:44.4  9.1% ;  Surgeon: Leandrew Koyanagi, MD;  Location: [facility];  Service: Ophthalmology;  Laterality: Right;  . COLONOSCOPY  15 years ago  . COLONOSCOPY WITH PROPOFOL N/A 11/10/2018   Procedure: COLONOSCOPY WITH PROPOFOL;  Surgeon: Lin Landsman, MD;  [facility] ENDOSCOPY;  Service: Gastroenterology;  Laterality: N/A;  . EYE SURGERY    . TONSILLECTOMY      Family Psychiatric History: see below  Family History:  Family History  Problem Relation Age of Onset  . Lymphoma Mother        Non Hodgkin  . Dementia Mother   . Alzheimer's disease Mother   . Lymphoma Sister        Non Hodgkin  . Depression Sister   . Heart attack Father   . Alcohol abuse Father   . Dementia Brother   . Dementia Maternal Uncle     Social History:  Social History   Socioeconomic History  . Marital status: Married    Spouse name: Not on file  . Number of children: Not on file  . Years of education: Not on file  . Highest education level: Some college, no degree  Occupational History  . Occupation: retired  Tobacco Use  . Smoking status: Current Every Day Smoker    Packs/day: 1.00    Years: 45.00    Pack years: 45.00    Types: Cigarettes    Last attempt to quit: 11/21/2015    Years since quitting: 3.9  . Smokeless tobacco: Never Used  Substance and Sexual Activity  . Alcohol use: No    Alcohol/week: 0.0 standard drinks  . Drug use: No  . Sexual activity: Not Currently  Other Topics Concern  . Not on file  Social History  Narrative   Goes to dinner with friends often, goes to see her mom at nursing home on a regular basis    Social Determinants of Health   Financial Resource Strain:   . Difficulty of Paying Living Expenses: Not on file  Food Insecurity:   . Worried About Charity fundraiser in the Last Year: Not on file  . Ran Out of Food in the Last Year: Not on file  Transportation Needs:   . Lack of Transportation (Medical): Not on file  . Lack of Transportation (Non-Medical): Not on file  Physical Activity:   . Days of Exercise per Week: Not on file  . Minutes of Exercise per Session: Not on file  Stress:   . Feeling of Stress : Not on file  Social Connections: Unknown  . Frequency of Communication with Friends and Family: Not on file  . Frequency of Social Gatherings with Friends and Family: Not on file  . Attends Religious Services: Never  . Active Member of Clubs or Organizations: Not on file  . Attends Archivist Meetings: Not on file  . Marital Status: Not on file    Allergies: No Known Allergies  Metabolic Disorder Labs: Lab Results  Component Value Date   HGBA1C 5.5 11/27/2018   MPG 111 11/27/2018   MPG 111 10/15/2017   No results found for: PROLACTIN Lab Results  Component Value Date   CHOL 275 (H) 11/27/2018   TRIG 240 (H) 11/27/2018   HDL 56 11/27/2018   CHOLHDL 4.9 11/27/2018   VLDL 27 11/05/2016   LDLCALC 178 (H) 11/27/2018   LDLCALC 145 (H) 10/15/2017   Lab Results  Component Value Date   TSH 4.47 11/27/2018   TSH 3.37 10/15/2017    Therapeutic Level Labs: No results found for: LITHIUM No results found for: VALPROATE No components found for:  CBMZ  Current Medications: Current Outpatient Medications  Medication Sig Dispense Refill  . albuterol (PROVENTIL) (2.5 MG/3ML) 0.083% nebulizer solution Take 3 mLs (2.5 mg total) by nebulization every 6 (six) hours as needed for wheezing or shortness of breath. 60 mL 1  . gabapentin (NEURONTIN) 300 MG  capsule Take 1 capsule (300 mg total) by mouth at bedtime. 90 capsule 0  . hydrOXYzine (ATARAX/VISTARIL) 10 MG tablet Take 1 tablet (10 mg total) by mouth 3 (three) times daily as needed for anxiety. 270 tablet 0  . lithium carbonate 150 MG capsule Take 1 capsule (150 mg total) by mouth 2 (two) times daily with a meal. 180 capsule 0  . QUEtiapine (SEROQUEL) 25 MG tablet Take 1 tablet (25 mg total) by mouth at bedtime. 90 tablet  2  . SPIRIVA HANDIHALER 18 MCG inhalation capsule Place 1 capsule (18 mcg total) into inhaler and inhale daily. 30 capsule 2  . venlafaxine XR (EFFEXOR XR) 75 MG 24 hr capsule Take 1 capsule (75 mg total) by mouth daily with breakfast. 90 capsule 0  . Vitamin D, Ergocalciferol, (DRISDOL) 1.25 MG (50000 UT) CAPS capsule Take 50,000 Units by mouth every 7 (seven) days.     No current facility-administered medications for this visit.    Musculoskeletal: Strength & Muscle Tone: unable to assess due to telemed visit Gait & Station: unable to assess due to telemed visit Patient leans: unable to assess due to telemed visit   Psychiatric Specialty Exam: Review of Systems  There were no vitals taken for this visit.There is no height or weight on file to calculate BMI.  General Appearance: Well Groomed  Eye Contact:  Good  Speech:  Clear and Coherent and Normal Rate  Volume:  Normal  Mood:  Euthymic  Affect:  Congruent  Thought Process:  Goal Directed, Linear and Descriptions of Associations: Intact  Orientation:  Full (Time, Place, and Person)  Thought Content: Logical   Suicidal Thoughts:  No  Homicidal Thoughts:  No  Memory:  Recent;   Good Remote;   Good  Judgement:  Good  Insight:  Good  Psychomotor Activity:  Normal  Concentration:  Concentration: Good and Attention Span: Good  Recall:  Good  Fund of Knowledge: Good  Language: Good  Akathisia:  Negative  Handed:  Right  AIMS (if indicated): not done  Assets:  Communication Skills Desire for  Improvement Financial Resources/Insurance Housing  ADL's:  Intact  Cognition: WNL  Sleep:  Good with help of Seroquel     Screenings: PHQ2-9     Clinical Support from 10/28/2018 in [facility] Office Visit from 07/31/2018 in [facility] from 10/22/2017 in [facility] Office Visit from 03/06/2016 in [facility] Office Visit from 11/08/2015 in [facility]  PHQ-2 Total Score  0  0  3  4  2   PHQ-9 Total Score  --  0  6  16  10        Assessment and Plan: 74 year old female with history of bipolar disorder, COPD seen for follow-up.  She appears to be stable on her current medication regimen.  1. Bipolar disorder in remission ([place])  - gabapentin (NEURONTIN) 300 MG capsule; Take 1 capsule (300 mg total) by mouth at bedtime.  Dispense: 90 capsule; Refill: 0 - lithium carbonate 150 MG capsule; Take 1 capsule (150 mg total) by mouth 2 (two) times daily with a meal.  Dispense: 180 capsule; Refill: 0 - venlafaxine XR (EFFEXOR XR) 75 MG 24 hr capsule; Take 1 capsule (75 mg total) by mouth daily with breakfast.  Dispense: 90 capsule; Refill: 0  2. Other mixed anxiety disorders  - hydrOXYzine (ATARAX/VISTARIL) 10 MG tablet; Take 1 tablet (10 mg total) by mouth 3 (three) times daily as needed for anxiety.  Dispense: 270 tablet; Refill: 0  Continue same medication regimen. Follow up in 3 months.   [place] Crane, MD 10/21/2019, 8:47 AM

## 2019-11-03 ENCOUNTER — Ambulatory Visit (INDEPENDENT_AMBULATORY_CARE_PROVIDER_SITE_OTHER): Payer: Self-pay

## 2019-11-03 ENCOUNTER — Other Ambulatory Visit: Payer: Self-pay

## 2019-11-03 VITALS — Wt 147.0 lb

## 2019-11-03 DIAGNOSIS — Z0000 Encounter for general adult medical examination without abnormal findings: Secondary | ICD-10-CM

## 2019-11-03 NOTE — Patient Instructions (Signed)
[name] , Thank you for taking time to come for your Medicare Wellness Visit. I appreciate your ongoing commitment to your health goals. Please review the following plan we discussed and let me know if I can assist you in the future.   Screening recommendations/referrals: Colonoscopy: up to date  Mammogram: declined currently  Bone Density: up to date  Recommended yearly ophthalmology/optometry visit for glaucoma screening and checkup Recommended yearly dental visit for hygiene and checkup  Vaccinations: Influenza vaccine: due now  Pneumococcal vaccine: up to date  Tdap vaccine: up to date  Shingles vaccine: shingrix eligible Covid-19: information provided     Advanced directives: Please bring a copy of your health care power of attorney and living will to the office at your convenience.  Conditions/risks identified: none   Next appointment: Follow up in one year for your annual wellness visit    Preventive Care 65 Years and Older, Female Preventive care refers to lifestyle choices and visits with your health care provider that can promote health and wellness. What does preventive care include?  A yearly physical exam. This is also called an annual well check.  Dental exams once or twice a year.  Routine eye exams. Ask your health care provider how often you should have your eyes checked.  Personal lifestyle choices, including:  Daily care of your teeth and gums.  Regular physical activity.  Eating a healthy diet.  Avoiding tobacco and drug use.  Limiting alcohol use.  Practicing safe sex.  Taking low-dose aspirin every day.  Taking vitamin and mineral supplements as recommended by your health care provider. What happens during an annual well check? The services and screenings done by your health care provider during your annual well check will depend on your age, overall health, lifestyle risk factors, and family history of disease. Counseling  Your health  care provider may ask you questions about your:  Alcohol use.  Tobacco use.  Drug use.  Emotional well-being.  Home and relationship well-being.  Sexual activity.  Eating habits.  History of falls.  Memory and ability to understand (cognition).  Work and work Statistician.  Reproductive health. Screening  You may have the following tests or measurements:  Height, weight, and BMI.  Blood pressure.  Lipid and cholesterol levels. These may be checked every 5 years, or more frequently if you are over 55 years old.  Skin check.  Lung cancer screening. You may have this screening every year starting at age 40 if you have a 30-pack-year history of smoking and currently smoke or have quit within the past 15 years.  Fecal occult blood test (FOBT) of the stool. You may have this test every year starting at age 61.  Flexible sigmoidoscopy or colonoscopy. You may have a sigmoidoscopy every 5 years or a colonoscopy every 10 years starting at age 69.  Hepatitis C blood test.  Hepatitis B blood test.  Sexually transmitted disease (STD) testing.  Diabetes screening. This is done by checking your blood sugar (glucose) after you have not eaten for a while (fasting). You may have this done every 1-3 years.  Bone density scan. This is done to screen for osteoporosis. You may have this done starting at age 12.  Mammogram. This may be done every 1-2 years. Talk to your health care provider about how often you should have regular mammograms. Talk with your health care provider about your test results, treatment options, and if necessary, the need for more tests. Vaccines  Your health  care provider may recommend certain vaccines, such as:  Influenza vaccine. This is recommended every year.  Tetanus, diphtheria, and acellular pertussis (Tdap, Td) vaccine. You may need a Td booster every 10 years.  Zoster vaccine. You may need this after age 35.  Pneumococcal 13-valent conjugate  (PCV13) vaccine. One dose is recommended after age 25.  Pneumococcal polysaccharide (PPSV23) vaccine. One dose is recommended after age 42. Talk to your health care provider about which screenings and vaccines you need and how often you need them. This information is not intended to replace advice given to you by your health care provider. Make sure you discuss any questions you have with your health care provider. Document Released: 09/23/2015 Document Revised: 05/16/2016 Document Reviewed: 06/28/2015 Elsevier Interactive Patient Education  2017 [place] Prevention in the Home Falls can cause injuries. They can happen to people of all ages. There are many things you can do to make your home safe and to help prevent falls. What can I do on the outside of my home?  Regularly fix the edges of walkways and driveways and fix any cracks.  Remove anything that might make you trip as you walk through a door, such as a raised step or threshold.  Trim any bushes or trees on the path to your home.  Use bright outdoor lighting.  Clear any walking paths of anything that might make someone trip, such as rocks or tools.  Regularly check to see if handrails are loose or broken. Make sure that both sides of any steps have handrails.  Any raised decks and porches should have guardrails on the edges.  Have any leaves, snow, or ice cleared regularly.  Use sand or salt on walking paths during winter.  Clean up any spills in your garage right away. This includes oil or grease spills. What can I do in the bathroom?  Use night lights.  Install grab bars by the toilet and in the tub and shower. Do not use towel bars as grab bars.  Use non-skid mats or decals in the tub or shower.  If you need to sit down in the shower, use a plastic, non-slip stool.  Keep the floor dry. Clean up any water that spills on the floor as soon as it happens.  Remove soap buildup in the tub or shower  regularly.  Attach bath mats securely with double-sided non-slip rug tape.  Do not have throw rugs and other things on the floor that can make you trip. What can I do in the bedroom?  Use night lights.  Make sure that you have a light by your bed that is easy to reach.  Do not use any sheets or blankets that are too big for your bed. They should not hang down onto the floor.  Have a firm chair that has side arms. You can use this for support while you get dressed.  Do not have throw rugs and other things on the floor that can make you trip. What can I do in the kitchen?  Clean up any spills right away.  Avoid walking on wet floors.  Keep items that you use a lot in easy-to-reach places.  If you need to reach something above you, use a strong step stool that has a grab bar.  Keep electrical cords out of the way.  Do not use floor polish or wax that makes floors slippery. If you must use wax, use non-skid floor wax.  Do not have  throw rugs and other things on the floor that can make you trip. What can I do with my stairs?  Do not leave any items on the stairs.  Make sure that there are handrails on both sides of the stairs and use them. Fix handrails that are broken or loose. Make sure that handrails are as long as the stairways.  Check any carpeting to make sure that it is firmly attached to the stairs. Fix any carpet that is loose or worn.  Avoid having throw rugs at the top or bottom of the stairs. If you do have throw rugs, attach them to the floor with carpet tape.  Make sure that you have a light switch at the top of the stairs and the bottom of the stairs. If you do not have them, ask someone to add them for you. What else can I do to help prevent falls?  Wear shoes that:  Do not have high heels.  Have rubber bottoms.  Are comfortable and fit you well.  Are closed at the toe. Do not wear sandals.  If you use a stepladder:  Make sure that it is fully  opened. Do not climb a closed stepladder.  Make sure that both sides of the stepladder are locked into place.  Ask someone to hold it for you, if possible.  Clearly mark and make sure that you can see:  Any grab bars or handrails.  First and last steps.  Where the edge of each step is.  Use tools that help you move around (mobility aids) if they are needed. These include:  Canes.  Walkers.  Scooters.  Crutches.  Turn on the lights when you go into a dark area. Replace any light bulbs as soon as they burn out.  Set up your furniture so you have a clear path. Avoid moving your furniture around.  If any of your floors are uneven, fix them.  If there are any pets around you, be aware of where they are.  Review your medicines with your doctor. Some medicines can make you feel dizzy. This can increase your chance of falling. Ask your doctor what other things that you can do to help prevent falls. This information is not intended to replace advice given to you by your health care provider. Make sure you discuss any questions you have with your health care provider. Document Released: 06/23/2009 Document Revised: 02/02/2016 Document Reviewed: 10/01/2014 Elsevier Interactive Patient Education  2017 [employer].

## 2019-11-03 NOTE — Progress Notes (Signed)
Subjective:   Mackenzie Ward is a 74 y.o. female who presents for Medicare Annual (Subsequent) preventive examination.  This visit is being conducted via phone call  - after an attmept to do on video chat - due to the COVID-19 pandemic. This patient has given me verbal consent via phone to conduct this visit, patient states they are participating from their home address. Some vital signs may be absent or patient reported.   Patient identification: identified by name, DOB, and current address.    Review of Systems:   Cardiac Risk Factors include: advanced age (>5men, >57 women);dyslipidemia;hypertension     Objective:     Vitals: Wt 147 lb (66.7 kg)   BMI 30.72 kg/m   Body mass index is 30.72 kg/m.  Advanced Directives 11/03/2019 09/30/2019 08/26/2019 11/10/2018 10/28/2018 10/22/2017  Does Patient Have a Medical Advance Directive? Yes Yes Yes Yes Yes No  Type of Advance Directive Living will;Healthcare Power of [place];Living will [place];Living will [place];Living will Living will;Healthcare Power of Attorney -  Does patient want to make changes to medical advance directive? - No - Patient declined No - Patient declined - - -  Copy of [place] in Chart? No - copy requested Yes - validated most recent copy scanned in chart (See row information) Yes - validated most recent copy scanned in chart (See row information) No - copy requested No - copy requested -  Would patient like information on creating a medical advance directive? - - - - - Yes (MAU/Ambulatory/Procedural Areas - Information given)  Some encounter information is confidential and restricted. Go to Review Flowsheets activity to see all data.    Tobacco Social History   Tobacco Use  Smoking Status Current Every Day Smoker  . Packs/day: 1.00  . Years: 45.00  . Pack years: 45.00  . Types: Cigarettes  Smokeless Tobacco Never Used      Ready to quit: No Counseling given: No   Clinical Intake:  Pre-visit preparation completed: Yes  Pain : No/denies pain     Nutritional Risks: None Diabetes: No  How often do you need to have someone help you when you read instructions, pamphlets, or other written materials from your doctor or pharmacy?: 1 - Never  Interpreter Needed?: No  Information entered by :: Shaylon Aden,LPN  Past Medical History:  Diagnosis Date  . Asthma   . COPD (chronic obstructive pulmonary disease) ([place])   . Depression   . High cholesterol    Past Surgical History:  Procedure Laterality Date  . BREAST SURGERY Right 2013  . CATARACT EXTRACTION W/PHACO Left 08/26/2019   Procedure: CATARACT EXTRACTION PHACO AND INTRAOCULAR LENS PLACEMENT (IOC) LEFT  4.45    00:40.5    11.0%;  Surgeon: Leandrew Koyanagi, MD;  Location: [facility];  Service: Ophthalmology;  Laterality: Left;  . CATARACT EXTRACTION W/PHACO Right 09/30/2019   Procedure: CATARACT EXTRACTION PHACO AND INTRAOCULAR LENS PLACEMENT (IOC) RIGHT 4.05  00:44.4  9.1% ;  Surgeon: Leandrew Koyanagi, MD;  Location: [place];  Service: Ophthalmology;  Laterality: Right;  . COLONOSCOPY  15 years ago  . COLONOSCOPY WITH PROPOFOL N/A 11/10/2018   Procedure: COLONOSCOPY WITH PROPOFOL;  Surgeon: Lin Landsman, MD;  Location: [facility] ENDOSCOPY;  Service: Gastroenterology;  Laterality: N/A;  . EYE SURGERY    . TONSILLECTOMY     Family History  Problem Relation Age of Onset  . Lymphoma Mother  Non Hodgkin  . Dementia Mother   . Alzheimer's disease Mother   . Lymphoma Sister        Non Hodgkin  . Depression Sister   . Heart attack Father   . Alcohol abuse Father   . Dementia Brother   . Dementia Maternal Uncle   . Alzheimer's disease Maternal Aunt   . Alzheimer's disease Paternal Aunt   . Alzheimer's disease Paternal Grandmother   . Alzheimer's disease Maternal Aunt   . Alzheimer's disease Paternal Aunt      Social History   Socioeconomic History  . Marital status: Married    Spouse name: Not on file  . Number of children: Not on file  . Years of education: Not on file  . Highest education level: Some college, no degree  Occupational History  . Occupation: retired  Tobacco Use  . Smoking status: Current Every Day Smoker    Packs/day: 1.00    Years: 45.00    Pack years: 45.00    Types: Cigarettes  . Smokeless tobacco: Never Used  Substance and Sexual Activity  . Alcohol use: No    Alcohol/week: 0.0 standard drinks  . Drug use: No  . Sexual activity: Not Currently  Other Topics Concern  . Not on file  Social History Narrative   Goes to dinner with friends often, goes to see her mom at nursing home on a regular basis    Social Determinants of Health   Financial Resource Strain:   . Difficulty of Paying Living Expenses: Not on file  Food Insecurity:   . Worried About Charity fundraiser in the Last Year: Not on file  . Ran Out of Food in the Last Year: Not on file  Transportation Needs:   . Lack of Transportation (Medical): Not on file  . Lack of Transportation (Non-Medical): Not on file  Physical Activity:   . Days of Exercise per Week: Not on file  . Minutes of Exercise per Session: Not on file  Stress:   . Feeling of Stress : Not on file  Social Connections:   . Frequency of Communication with Friends and Family: Not on file  . Frequency of Social Gatherings with Friends and Family: Not on file  . Attends Religious Services: Not on file  . Active Member of Clubs or Organizations: Not on file  . Attends Archivist Meetings: Not on file  . Marital Status: Not on file    Outpatient Encounter Medications as of 11/03/2019  Medication Sig  . gabapentin (NEURONTIN) 300 MG capsule Take 1 capsule (300 mg total) by mouth at bedtime.  . hydrOXYzine (ATARAX/VISTARIL) 10 MG tablet Take 1 tablet (10 mg total) by mouth 3 (three) times daily as needed for anxiety.  Marland Kitchen  lithium carbonate 150 MG capsule Take 1 capsule (150 mg total) by mouth 2 (two) times daily with a meal.  . Nutritional Supplements (VITAMIN D BOOSTER PO) Take by mouth.  . QUEtiapine (SEROQUEL) 25 MG tablet Take 1 tablet (25 mg total) by mouth at bedtime.  Marland Kitchen SPIRIVA HANDIHALER 18 MCG inhalation capsule Place 1 capsule (18 mcg total) into inhaler and inhale daily.  Marland Kitchen venlafaxine XR (EFFEXOR XR) 75 MG 24 hr capsule Take 1 capsule (75 mg total) by mouth daily with breakfast.  . [DISCONTINUED] Vitamin D, Ergocalciferol, (DRISDOL) 1.25 MG (50000 UT) CAPS capsule Take 50,000 Units by mouth every 7 (seven) days.  Marland Kitchen albuterol (PROVENTIL) (2.5 MG/3ML) 0.083% nebulizer solution Take 3 mLs (2.5  mg total) by nebulization every 6 (six) hours as needed for wheezing or shortness of breath. (Patient not taking: Reported on 11/03/2019)   No facility-administered encounter medications on file as of 11/03/2019.    Activities of Daily Living In your present state of health, do you have any difficulty performing the following activities: 11/03/2019 09/30/2019  Hearing? N N  Comment no hearing aids -  Vision? N N  Comment goes to [facility], eyeglasses -  Difficulty concentrating or making decisions? N N  Walking or climbing stairs? Y N  Comment for long periods -  Dressing or bathing? N N  Doing errands, shopping? N -  Preparing Food and eating ? N -  Using the Toilet? N -  In the past six months, have you accidently leaked urine? N -  Do you have problems with loss of bowel control? N -  Managing your Medications? N -  Managing your Finances? N -  Housekeeping or managing your Housekeeping? Y -  Comment has hard time, has to do house work in increments. -  Some recent data might be hidden    Patient Care Team: Olin Hauser, DO as PCP - General (Family Medicine) Rainey Pines, MD as Consulting Physician (Psychiatry)    Assessment:   This is a routine wellness examination for  [place].  Exercise Activities and Dietary recommendations Current Exercise Habits: The patient does not participate in regular exercise at present, Exercise limited by: None identified  Goals    . DIET - INCREASE WATER INTAKE     Recommend drinking at least 6-8 glasses of water a day.     . Quit Smoking     Smoking cessation discussed       Fall Risk: Fall Risk  11/03/2019 10/28/2018 07/31/2018 10/22/2017 03/06/2017  Falls in the past year? 0 0 0 No No  Number falls in past yr: 0 0 - - -  Injury with Fall? 0 0 - - -  Comment - - - - -  Risk for fall due to : - - - - -  Follow up - - Falls evaluation completed - -    FALL RISK PREVENTION PERTAINING TO THE HOME:  Any stairs in or around the home? Yes  steps going in home  If so, are there any without handrails? No   Home free of loose throw rugs in walkways, pet beds, electrical cords, etc? Yes  Adequate lighting in your home to reduce risk of falls? Yes   ASSISTIVE DEVICES UTILIZED TO PREVENT FALLS:  Life alert? No  Use of a cane, walker or w/c? No  Grab bars in the bathroom? Yes  Shower chair or bench in shower? No  Elevated toilet seat or a handicapped toilet? No   DME ORDERS:  DME order needed?  No   TIMED UP AND GO:  Unable to perform    Depression Screen PHQ 2/9 Scores 11/03/2019 10/28/2018 07/31/2018 10/22/2017  PHQ - 2 Score 1 0 0 3  PHQ- 9 Score - - 0 6     Cognitive Function     6CIT Screen 10/28/2018 10/22/2017  What Year? 0 points 0 points  What month? 0 points 0 points  What time? 0 points 0 points  Count back from 20 0 points 0 points  Months in reverse 0 points 0 points  Repeat phrase 0 points 0 points  Total Score 0 0    Immunization History  Administered Date(s) Administered  . Influenza,  High Dose Seasonal PF 06/30/2014, 06/20/2015, 06/21/2017, 07/31/2018  . Influenza,inj,Quad PF,6+ Mos 06/30/2013  . Influenza-Unspecified 06/30/2014  . Pneumococcal Conjugate-13 06/30/2014  .  Pneumococcal Polysaccharide-23 10/22/2017  . Tdap 08/04/2014    Qualifies for Shingles Vaccine? Yes  Zostavax completed n/a. Due for Shingrix. Education has been provided regarding the importance of this vaccine. Pt has been advised to call insurance company to determine out of pocket expense. Advised may also receive vaccine at local pharmacy or Health Dept. Verbalized acceptance and understanding.  Tdap: up to date .  Flu Vaccine: due now.   Pneumococcal Vaccine: up to date .   Screening Tests Health Maintenance  Topic Date Due  . INFLUENZA VACCINE  12/09/2019 (Originally 04/11/2019)  . MAMMOGRAM  11/02/2020 (Originally 06/19/2019)  . COLONOSCOPY  11/09/2021  . TETANUS/TDAP  08/04/2024  . DEXA SCAN  Completed  . Hepatitis C Screening  Completed  . PNA vac Low Risk Adult  Completed    Cancer Screenings:  Colorectal Screening: Completed 11/10/2018. Repeat every 3 years  Mammogram: Declined   Bone Density: Completed 2016.   Lung Cancer Screening: (Low Dose CT Chest recommended if Age 54-80 years, 30 pack-year currently smoking OR have quit w/in 15years.) does qualify.    Additional Screening:  Hepatitis C Screening: does qualify; Completed 2016  Vision Screening: Recommended annual ophthalmology exams for early detection of glaucoma and other disorders of the eye. Is the patient up to date with their annual eye exam?  Yes  Who is the provider or what is the name of the office in which the pt attends annual eye exams? [facility]    Dental Screening: Recommended annual dental exams for proper oral hygiene  Community Resource Referral:  CRR required this visit?  No       Plan:  I have personally reviewed and addressed the Medicare Annual Wellness questionnaire and have noted the following in the patient's chart:  A. Medical and social history B. Use of alcohol, tobacco or illicit drugs  C. Current medications and supplements D. Functional ability and status E.   Nutritional status F.  Physical activity G. Advance directives H. List of other physicians I.  Hospitalizations, surgeries, and ER visits in previous 12 months J.  [place] such as hearing and vision if needed, cognitive and depression L. Referrals and appointments   In addition, I have reviewed and discussed with patient certain preventive protocols, quality metrics, and best practice recommendations. A written personalized care plan for preventive services as well as general preventive health recommendations were provided to patient.  Signed,    Bevelyn Ngo, LPN  [id number] Nurse Health Advisor   Nurse Notes: none

## 2019-11-10 ENCOUNTER — Telehealth: Payer: Self-pay | Admitting: *Deleted

## 2019-11-10 DIAGNOSIS — Z87891 Personal history of nicotine dependence: Secondary | ICD-10-CM

## 2019-11-10 NOTE — Telephone Encounter (Signed)
Patient has been notified that annual lung cancer screening low dose CT scan is due currently or will be in near future. Confirmed that patient is within the age range of 55-77, and asymptomatic, (no signs or symptoms of lung cancer). Patient denies illness that would prevent curative treatment for lung cancer if found. Verified smoking history, (current, 47 pack year). The shared decision making visit was done 11/12/17. Patient is agreeable for CT scan being scheduled.

## 2019-11-16 ENCOUNTER — Ambulatory Visit
Admission: RE | Admit: 2019-11-16 | Discharge: 2019-11-16 | Disposition: A | Discharge status: Home / Self Care | Payer: Medicare PPO | Source: Ambulatory Visit | Attending: Oncology | Admitting: Oncology

## 2019-11-16 ENCOUNTER — Other Ambulatory Visit: Payer: Self-pay

## 2019-11-16 DIAGNOSIS — F17210 Nicotine dependence, cigarettes, uncomplicated: Secondary | ICD-10-CM | POA: Diagnosis not present

## 2019-11-16 DIAGNOSIS — Z87891 Personal history of nicotine dependence: Secondary | ICD-10-CM | POA: Insufficient documentation

## 2019-11-23 ENCOUNTER — Encounter: Payer: Self-pay | Admitting: *Deleted

## 2019-12-18 DIAGNOSIS — F419 Anxiety disorder, unspecified: Secondary | ICD-10-CM | POA: Diagnosis not present

## 2019-12-18 DIAGNOSIS — Z8249 Family history of ischemic heart disease and other diseases of the circulatory system: Secondary | ICD-10-CM | POA: Diagnosis not present

## 2019-12-18 DIAGNOSIS — F319 Bipolar disorder, unspecified: Secondary | ICD-10-CM | POA: Diagnosis not present

## 2019-12-18 DIAGNOSIS — G629 Polyneuropathy, unspecified: Secondary | ICD-10-CM | POA: Diagnosis not present

## 2019-12-18 DIAGNOSIS — M2550 Pain in unspecified joint: Secondary | ICD-10-CM | POA: Diagnosis not present

## 2019-12-18 DIAGNOSIS — J449 Chronic obstructive pulmonary disease, unspecified: Secondary | ICD-10-CM | POA: Diagnosis not present

## 2019-12-18 DIAGNOSIS — E669 Obesity, unspecified: Secondary | ICD-10-CM | POA: Diagnosis not present

## 2019-12-18 DIAGNOSIS — G8929 Other chronic pain: Secondary | ICD-10-CM | POA: Diagnosis not present

## 2019-12-18 DIAGNOSIS — Z820 Family history of epilepsy and other diseases of the nervous system: Secondary | ICD-10-CM | POA: Diagnosis not present

## 2019-12-18 DIAGNOSIS — Z823 Family history of stroke: Secondary | ICD-10-CM | POA: Diagnosis not present

## 2019-12-18 DIAGNOSIS — R030 Elevated blood-pressure reading, without diagnosis of hypertension: Secondary | ICD-10-CM | POA: Diagnosis not present

## 2019-12-18 DIAGNOSIS — G2581 Restless legs syndrome: Secondary | ICD-10-CM | POA: Diagnosis not present

## 2020-01-07 ENCOUNTER — Encounter: Payer: Self-pay | Admitting: Family Medicine

## 2020-01-07 ENCOUNTER — Other Ambulatory Visit: Payer: Self-pay

## 2020-01-07 ENCOUNTER — Ambulatory Visit
Admission: RE | Admit: 2020-01-07 | Discharge: 2020-01-07 | Disposition: A | Discharge status: Home / Self Care | Payer: Medicare PPO | Source: Ambulatory Visit | Attending: Family Medicine | Admitting: Family Medicine

## 2020-01-07 ENCOUNTER — Ambulatory Visit: Payer: Medicare PPO | Admitting: Family Medicine

## 2020-01-07 VITALS — BP 128/70 | HR 88 | Temp 97.8°F | Resp 16 | Ht <= 58 in | Wt 149.0 lb

## 2020-01-07 DIAGNOSIS — G8929 Other chronic pain: Secondary | ICD-10-CM | POA: Insufficient documentation

## 2020-01-07 DIAGNOSIS — F3170 Bipolar disorder, currently in remission, most recent episode unspecified: Secondary | ICD-10-CM

## 2020-01-07 DIAGNOSIS — M545 Low back pain: Secondary | ICD-10-CM | POA: Diagnosis not present

## 2020-01-07 DIAGNOSIS — M5441 Lumbago with sciatica, right side: Secondary | ICD-10-CM | POA: Diagnosis not present

## 2020-01-07 DIAGNOSIS — R0600 Dyspnea, unspecified: Secondary | ICD-10-CM

## 2020-01-07 DIAGNOSIS — J432 Centrilobular emphysema: Secondary | ICD-10-CM | POA: Diagnosis not present

## 2020-01-07 DIAGNOSIS — R0609 Other forms of dyspnea: Secondary | ICD-10-CM

## 2020-01-07 MED ORDER — GABAPENTIN 300 MG PO CAPS: 300.0000 mg | ORAL_CAPSULE | Freq: Two times a day (BID) | ORAL | 0 refills | Status: DC

## 2020-01-07 MED ORDER — SPIRIVA HANDIHALER 18 MCG IN CAPS: ORAL_CAPSULE | RESPIRATORY_TRACT | 3 refills | Status: DC

## 2020-01-07 NOTE — Patient Instructions (Addendum)
Thank you for coming to the office today.  Recommend to start taking Tylenol Extra Strength 500mg  tabs - take 1 to 2 tabs per dose (max 1000mg ) every 6-8 hours for pain (take regularly, don't skip a dose for next 7 days), max 24 hour daily dose is 6 tablets or 3000mg . In the future you can repeat the same everyday Tylenol course for 1-2 weeks at a time.   Increase Gabapentin to one capsule TWICE a day now, can increase to 3 times a day in the future if tolerated.  Back X-ray likely arthritis pinching nerve.  Spot on head is fine, no concern for cancer. It is unchanged in 4 years. Seborrheic Keratosis. And the spot on back is fine as well.   Please schedule a Follow-up Appointment to: Return in about 6 months (around 07/08/2020) for 6 month follow-up COPD, Back Pain, Bipolar.  If you have any other questions or concerns, please feel free to call the office or send a message through [place]. You may also schedule an earlier appointment if necessary.  Additionally, you may be receiving a survey about your experience at our office within a few days to 1 week by e-mail or mail. We value your feedback.  Nobie Putnam, DO [place]

## 2020-01-07 NOTE — Assessment & Plan Note (Signed)
Asymptomatic. Stable, remains improved COPD, mild emphysema Improved functional respiratory status on anti cholinergic No formal PFT or Pulmonology - patient declines Infrequent flares Last imaging, confirm emphysema on CT Chest (11/2017 - lung CA screen)  Plan Continue Spiriva 22mcg daily inhalation - future consider switch to Anoro if recurrent exacerbations. Future may consider PFTs and Pulmonology Repeat yearly Chest CT screening in future if ready Smoking cessation - declined Follow-up in 6 months

## 2020-01-07 NOTE — Assessment & Plan Note (Addendum)
Stable chronic problem in remission Followed by [facility] on current medications now without change - Seroquel 25mg  bedtime, Effexor 75mg  AM, Lithium 150mg  BID  Advised patient that I would recommend that Mackenzie Ward continue with med management from her Psychiatry while on Lithium therapy. I am okay to collaborate with Psychiatry in future if it is truly needed to see them less often.

## 2020-01-07 NOTE — Progress Notes (Signed)
Subjective:    Patient ID: Mackenzie Ward, female    DOB: 1945/09/29, 74 y.o.   MRN: [id number]  Mackenzie Ward is a 74 y.o. female presenting on 01/07/2020 for Gait Problem (also RLS ) and Mass (on her back been there from years which is getting bigger now )   HPI   FOLLOW-UPCOPD(Mild Emphysema) - Last visit with me 07/2018 for routine COPD follow-up, continued on Spiriva daily and had LDCT for Lung CA Screen, see prior notes for background information. - Interval update has improved on SPiriva, would like refill now. - Today patient reports doing well. No recent flare. - She can tolerate regular activity without dyspnea. - Continues on Spiriva once daily - Rarely using Albuterol nebulizer once every 6 months except flare up -She has never seen Pulmonology or had PFT, and she is not interested at this time. Denies any worsening dyspnea, chest pain or tightness, productive cough, wheezing  Chronic Low Back Pain, OA/DJD / Sciatica R sided Chronic problem Associated symptoms RLS and RLE extremity pain and radiating nerve pain Affecting her gait at times. Gait - Taking Gabapentin 300mg  nightly for RLS helping her symptoms - bottom of feet, cold burn prickle occasionally  Lipoma on Back Years ago was told lipoma by Dr Luan Pulling. Asking today about this, it doesn't bother her or cause pain.  Seborrheic Keratosis, Scalp - Family member noticed a mole on her scalp when cutting her hair recently, wanted to get it checked out. Unsure how long it has been there, described a large brown flat spot. - No prior history of skin cancer - Denies redness, bleeding, swelling, pain  Bipolar Disorder in remission  Followed by [facility], last seen 10/2019 On med management Venlafaxine 75mg  daily, Quetiapine 25mg  nightly, Lithium 150mg  BID, Hydroxyzine PRN  Health Maintenance: UTD COVID19 vaccine  Depression screen [facility] 2/9 01/07/2020 11/03/2019 10/28/2018  Decreased Interest 3 0 0    Down, Depressed, Hopeless 0 1 0  PHQ - 2 Score 3 1 0  Altered sleeping 0 - -  Tired, decreased energy 3 - -  Change in appetite 2 - -  Feeling bad or failure about yourself  0 - -  Trouble concentrating 1 - -  Moving slowly or fidgety/restless 3 - -  Suicidal thoughts 0 - -  PHQ-9 Score 12 - -  Difficult doing work/chores Somewhat difficult - -  Some recent data might be hidden   GAD 7 : Generalized Anxiety Score 01/07/2020  Nervous, Anxious, on Edge 0  Control/stop worrying 0  Worry too much - different things 0  Trouble relaxing 3  Restless 0  Easily annoyed or irritable 0  Afraid - awful might happen 0  Total GAD 7 Score 3  Anxiety Difficulty Not difficult at all      Social History   Tobacco Use  . Smoking status: Current Every Day Smoker    Packs/day: 1.00    Years: 45.00    Pack years: 45.00    Types: Cigarettes  . Smokeless tobacco: Never Used  Substance Use Topics  . Alcohol use: No    Alcohol/week: 0.0 standard drinks  . Drug use: No    Review of Systems Per HPI unless specifically indicated above     Objective:    BP 128/70   Pulse 88   Temp 97.8 F (36.6 C) (Temporal)   Resp 16   Ht 4\' 10"  (1.473 m)   Wt 149 lb (67.6 kg)   SpO2  96%   BMI 31.14 kg/m   Wt Readings from Last 3 Encounters:  01/07/20 149 lb (67.6 kg)  11/16/19 147 lb (66.7 kg)  11/03/19 147 lb (66.7 kg)    Physical Exam Vitals and nursing note reviewed.  Constitutional:      General: She is not in acute distress.    Appearance: She is well-developed. She is not diaphoretic.     Comments: Well-appearing, comfortable, cooperative  HENT:     Head: Normocephalic and atraumatic.  Eyes:     General:        Right eye: No discharge.        Left eye: No discharge.     Conjunctiva/sclera: Conjunctivae normal.  Neck:     Thyroid: No thyromegaly.  Cardiovascular:     Rate and Rhythm: Normal rate and regular rhythm.     Heart sounds: Normal heart sounds. No murmur.   Pulmonary:     Effort: Pulmonary effort is normal. No respiratory distress.     Breath sounds: Normal breath sounds. No wheezing or rales.  Musculoskeletal:        General: Normal range of motion.     Cervical back: Normal range of motion and neck supple.  Lymphadenopathy:     Cervical: No cervical adenopathy.  Skin:    General: Skin is warm and dry.     Findings: No erythema or rash.  Neurological:     Mental Status: She is alert and oriented to person, place, and time.  Psychiatric:        Behavior: Behavior normal.     Comments: Well groomed, good eye contact, normal speech and thoughts    Results for orders placed or performed during the hospital encounter of 09/28/19  SARS CORONAVIRUS 2 (TAT 6-24 HRS) Nasopharyngeal Nasopharyngeal Swab   Specimen: Nasopharyngeal Swab  Result Value Ref Range   SARS Coronavirus 2 NEGATIVE NEGATIVE      Assessment & Plan:   Problem List Items Addressed This Visit    Chronic right-sided low back pain with right-sided sciatica - Primary   Relevant Medications   gabapentin (NEURONTIN) 300 MG capsule   Other Relevant Orders   DG Lumbar Spine Complete   Centrilobular emphysema ([place])    Asymptomatic. Stable, remains improved COPD, mild emphysema Improved functional respiratory status on anti cholinergic No formal PFT or Pulmonology - patient declines Infrequent flares Last imaging, confirm emphysema on CT Chest (11/2017 - lung CA screen)  Plan Continue Spiriva 43mcg daily inhalation - future consider switch to Anoro if recurrent exacerbations. Future may consider PFTs and Pulmonology Repeat yearly Chest CT screening in future if ready Smoking cessation - declined Follow-up in 6 months      Relevant Medications   tiotropium (SPIRIVA HANDIHALER) 18 MCG inhalation capsule   Bipolar disorder in remission ([place])    Stable chronic problem in remission Followed by [facility] on current medications now without change -  Seroquel 25mg  bedtime, Effexor 75mg  AM, Lithium 150mg  BID  Advised patient that I would recommend that she continue with med management from her Psychiatry while on Lithium therapy. I am okay to collaborate with Psychiatry in future if it is truly needed to see them less often.      Relevant Medications   gabapentin (NEURONTIN) 300 MG capsule    Other Visit Diagnoses    Dyspnea on exertion       Relevant Medications   tiotropium (SPIRIVA HANDIHALER) 18 MCG inhalation capsule  Subacute on chronic R LBP with associated R sciatica. Suspect likely due to muscle spasm/strain, without known injury or trauma. In setting of known chronic LBP with DJD,  - Inadequate conservative therapy   Plan: 1. Increase dose Gabapentin from 300mg  nightly up to 2-3 times a day to see if improved control of back / radicular pain. 2. May use Tylenol PRN for breakthrough 3. Encouraged use of heating pad 1-2x daily for now then PRN Follow-up sooner if not improved for re-evaluation, consider X-ray imaging, trial of PT, and possibly referral to Orthopedic    Meds ordered this encounter  Medications  . tiotropium (SPIRIVA HANDIHALER) 18 MCG inhalation capsule    Sig: Place 1 capsule (18 mcg total) into inhaler and inhale daily.    Dispense:  90 capsule    Refill:  3  . gabapentin (NEURONTIN) 300 MG capsule    Sig: Take 1 capsule (300 mg total) by mouth 2 (two) times daily.    Dispense:  180 capsule    Refill:  0    Dose increase to 2 times a day, does not need filled at this time, already has. But wanted to send new instructions      Follow up plan: Return in about 6 months (around 07/08/2020) for 6 month follow-up COPD, Back Pain, Bipolar.   Mackenzie Ward, [facility] 01/07/2020, 10:24 AM

## 2020-01-12 ENCOUNTER — Other Ambulatory Visit: Payer: Self-pay

## 2020-01-12 ENCOUNTER — Encounter: Payer: Self-pay | Admitting: Psychiatry

## 2020-01-12 ENCOUNTER — Telehealth (INDEPENDENT_AMBULATORY_CARE_PROVIDER_SITE_OTHER): Payer: Medicare PPO | Admitting: Psychiatry

## 2020-01-12 DIAGNOSIS — F413 Other mixed anxiety disorders: Secondary | ICD-10-CM | POA: Diagnosis not present

## 2020-01-12 DIAGNOSIS — F3170 Bipolar disorder, currently in remission, most recent episode unspecified: Secondary | ICD-10-CM

## 2020-01-12 MED ORDER — VENLAFAXINE HCL ER 75 MG PO CP24: 75.0000 mg | ORAL_CAPSULE | Freq: Every day | ORAL | 0 refills | Status: DC

## 2020-01-12 MED ORDER — QUETIAPINE FUMARATE 25 MG PO TABS: 25.0000 mg | ORAL_TABLET | Freq: Every day | ORAL | 2 refills | Status: DC

## 2020-01-12 MED ORDER — LITHIUM CARBONATE 150 MG PO CAPS: 150.0000 mg | ORAL_CAPSULE | Freq: Two times a day (BID) | ORAL | 0 refills | Status: DC

## 2020-01-12 MED ORDER — HYDROXYZINE HCL 10 MG PO TABS: 10.0000 mg | ORAL_TABLET | Freq: Three times a day (TID) | ORAL | 0 refills | Status: DC | PRN

## 2020-01-12 MED ORDER — GABAPENTIN 300 MG PO CAPS: 300.0000 mg | ORAL_CAPSULE | Freq: Two times a day (BID) | ORAL | 0 refills | Status: DC

## 2020-01-12 NOTE — Progress Notes (Signed)
BH MD/PA/NP OP Progress Note  I connected with  Mackenzie Ward on 01/12/20 by a video enabled telemedicine application and verified that I am speaking with the correct person using two identifiers.   I discussed the limitations of evaluation and management by telemedicine. The patient expressed understanding and agreed to proceed.    01/12/2020 11:22 AM Mackenzie Ward  MRN:  [id number]  Chief Complaint: " I am doing well."  HPI:74 year old female seen today via telehealth. She report that things are going well however reports that she was diagnosed with Sciatica on last visit with PCP. She reports that she and her husband got the covid 19 vaccination although hesitant at first. No noted side effects. Patient reports that she feel happy and equates it to the beautiful weather.     Visit Diagnosis:    ICD-10-CM   1. Bipolar disorder in remission ([place])  F31.70   2. Other mixed anxiety disorders  F41.3     Past Psychiatric History: Bipolar d/o, anxiety d/o  Past Medical History:  Past Medical History:  Diagnosis Date  . Asthma   . COPD (chronic obstructive pulmonary disease) ([place])   . Depression   . High cholesterol     Past Surgical History:  Procedure Laterality Date  . BREAST SURGERY Right 2013  . CATARACT EXTRACTION W/PHACO Left 08/26/2019   Procedure: CATARACT EXTRACTION PHACO AND INTRAOCULAR LENS PLACEMENT (IOC) LEFT  4.45    00:40.5    11.0%;  Surgeon: Leandrew Koyanagi, MD;  Location: [facility];  Service: Ophthalmology;  Laterality: Left;  . CATARACT EXTRACTION W/PHACO Right 09/30/2019   Procedure: CATARACT EXTRACTION PHACO AND INTRAOCULAR LENS PLACEMENT (IOC) RIGHT 4.05  00:44.4  9.1% ;  Surgeon: Leandrew Koyanagi, MD;  Location: [facility];  Service: Ophthalmology;  Laterality: Right;  . COLONOSCOPY  15 years ago  . COLONOSCOPY WITH PROPOFOL N/A 11/10/2018   Procedure: COLONOSCOPY WITH PROPOFOL;  Surgeon: Lin Landsman, MD;  Location[facility]  [facility] ENDOSCOPY;  Service: Gastroenterology;  Laterality: N/A;  . EYE SURGERY    . TONSILLECTOMY      Family Psychiatric History: see below  Family History:  Family History  Problem Relation Age of Onset  . Lymphoma Mother        Non Hodgkin  . Dementia Mother   . Alzheimer's disease Mother   . Lymphoma Sister        Non Hodgkin  . Depression Sister   . Heart attack Father   . Alcohol abuse Father   . Dementia Brother   . Dementia Maternal Uncle   . Alzheimer's disease Maternal Aunt   . Alzheimer's disease Paternal Aunt   . Alzheimer's disease Paternal Grandmother   . Alzheimer's disease Maternal Aunt   . Alzheimer's disease Paternal Aunt     Social History:  Social History   Socioeconomic History  . Marital status: Married    Spouse name: Not on file  . Number of children: Not on file  . Years of education: Not on file  . Highest education level: Some college, no degree  Occupational History  . Occupation: retired  Tobacco Use  . Smoking status: Current Every Day Smoker    Packs/day: 1.00    Years: 45.00    Pack years: 45.00    Types: Cigarettes  . Smokeless tobacco: Never Used  Substance and Sexual Activity  . Alcohol use: No    Alcohol/week: 0.0 standard drinks  . Drug use: No  . Sexual  activity: Not Currently  Other Topics Concern  . Not on file  Social History Narrative   Goes to dinner with friends often, goes to see her mom at nursing home on a regular basis    Social Determinants of Health   Financial Resource Strain:   . Difficulty of Paying Living Expenses:   Food Insecurity:   . Worried About Charity fundraiser in the Last Year:   . Arboriculturist in the Last Year:   Transportation Needs:   . Film/video editor (Medical):   Marland Kitchen Lack of Transportation (Non-Medical):   Physical Activity:   . Days of Exercise per Week:   . Minutes of Exercise per Session:   Stress:   . Feeling of Stress :   Social Connections:   . Frequency of  Communication with Friends and Family:   . Frequency of Social Gatherings with Friends and Family:   . Attends Religious Services:   . Active Member of Clubs or Organizations:   . Attends Archivist Meetings:   Marland Kitchen Marital Status:     Allergies: No Known Allergies  Metabolic Disorder Labs: Lab Results  Component Value Date   HGBA1C 5.5 11/27/2018   MPG 111 11/27/2018   MPG 111 10/15/2017   No results found for: PROLACTIN Lab Results  Component Value Date   CHOL 275 (H) 11/27/2018   TRIG 240 (H) 11/27/2018   HDL 56 11/27/2018   CHOLHDL 4.9 11/27/2018   VLDL 27 11/05/2016   LDLCALC 178 (H) 11/27/2018   LDLCALC 145 (H) 10/15/2017   Lab Results  Component Value Date   TSH 4.47 11/27/2018   TSH 3.37 10/15/2017    Therapeutic Level Labs: No results found for: LITHIUM No results found for: VALPROATE No components found for:  CBMZ  Current Medications: Current Outpatient Medications  Medication Sig Dispense Refill  . albuterol (PROVENTIL) (2.5 MG/3ML) 0.083% nebulizer solution Take 3 mLs (2.5 mg total) by nebulization every 6 (six) hours as needed for wheezing or shortness of breath. 60 mL 1  . gabapentin (NEURONTIN) 300 MG capsule Take 1 capsule (300 mg total) by mouth 2 (two) times daily. 180 capsule 0  . hydrOXYzine (ATARAX/VISTARIL) 10 MG tablet Take 1 tablet (10 mg total) by mouth 3 (three) times daily as needed for anxiety. 270 tablet 0  . lithium carbonate 150 MG capsule Take 1 capsule (150 mg total) by mouth 2 (two) times daily with a meal. 180 capsule 0  . Nutritional Supplements (VITAMIN D BOOSTER PO) Take by mouth.    . QUEtiapine (SEROQUEL) 25 MG tablet Take 1 tablet (25 mg total) by mouth at bedtime. 90 tablet 2  . tiotropium (SPIRIVA HANDIHALER) 18 MCG inhalation capsule Place 1 capsule (18 mcg total) into inhaler and inhale daily. 90 capsule 3  . venlafaxine XR (EFFEXOR XR) 75 MG 24 hr capsule Take 1 capsule (75 mg total) by mouth daily with breakfast.  90 capsule 0   No current facility-administered medications for this visit.    Musculoskeletal: Strength & Muscle Tone: unable to assess due to telemed visit Gait & Station: unable to assess due to telemed visit Patient leans: unable to assess due to telemed visit   Psychiatric Specialty Exam: Review of Systems  There were no vitals taken for this visit.There is no height or weight on file to calculate BMI.  General Appearance: Well Groomed  Eye Contact:  Good  Speech:  Clear and Coherent and Normal Rate  Volume:  Normal  Mood:  Euthymic  Affect:  Congruent  Thought Process:  Goal Directed, Linear and Descriptions of Associations: Intact  Orientation:  Full (Time, Place, and Person)  Thought Content: Logical   Suicidal Thoughts:  No  Homicidal Thoughts:  No  Memory:  Recent;   Good Remote;   Good  Judgement:  Good  Insight:  Good  Psychomotor Activity:  Normal  Concentration:  Concentration: Good and Attention Span: Good  Recall:  Good  Fund of Knowledge: Good  Language: Good  Akathisia:  Negative  Handed:  Right  AIMS (if indicated): not done  Assets:  Communication Skills Desire for Improvement Financial Resources/Insurance Housing  ADL's:  Intact  Cognition: WNL  Sleep:  Good with help of Seroquel     Screenings: GAD-7     [facility] Visit from 01/07/2020 in [facility]  Total GAD-7 Score  3    PHQ2-9     Office Visit from 01/07/2020 in [place] from 11/03/2019 in [facility] [place] from 10/28/2018 in [facility] Office Visit from 07/31/2018 in [place] from 10/22/2017 in [place]  PHQ-2 Total Score  3  1  0  0  3  PHQ-9 Total Score  12  --  --  0  6       Assessment and Plan: 74 year old female with history of bipolar disorder, COPD seen for follow-up.  She appears to be stable on her current medication  regimen.  1. Bipolar disorder in remission ([place])  - gabapentin (NEURONTIN) 300 MG capsule; Take 1 capsule (300 mg total) by mouth at bedtime.  Dispense: 90 capsule; Refill: 0 - lithium carbonate 150 MG capsule; Take 1 capsule (150 mg total) by mouth 2 (two) times daily with a meal.  Dispense: 180 capsule; Refill: 0 - venlafaxine XR (EFFEXOR XR) 75 MG 24 hr capsule; Take 1 capsule (75 mg total) by mouth daily with breakfast.  Dispense: 90 capsule; Refill: 0  2. Other mixed anxiety disorders  - hydrOXYzine (ATARAX/VISTARIL) 10 MG tablet; Take 1 tablet (10 mg total) by mouth 3 (three) times daily as needed for anxiety.  Dispense: 270 tablet; Refill: 0  Continue same medication regimen. Follow up in 3 months.   Salley Slaughter, NP 01/12/2020, 11:22 AM

## 2020-01-13 ENCOUNTER — Telehealth: Payer: Self-pay

## 2020-01-13 NOTE — Telephone Encounter (Signed)
Patient informed. 

## 2020-01-13 NOTE — Telephone Encounter (Signed)
Copied from [place] [id number]. Topic: Quick Communication - Other Results (Clinic Use ONLY) >> Jan 13, 2020  9:43 AM Lennox Solders wrote: Pt is calling and would like xray results for 01/07/2020

## 2020-01-13 NOTE — Telephone Encounter (Signed)
Results have already been released to [place].  I have added comments in a result note now. Here is copy.  If you can notify patient that would be great. Thanks    Here is X-ray result.  No acute injury of spine. No fracture or dislocation.  There is evidence of mild degenerative arthritis changes.  She should continue with the plan of increasing Gabapentin first. If still not improving, we can follow-up and she can contact us sooner within next 4-6 weeks and we can consider a referral to Orthopedics as next step.  Nobie Putnam, DO [facility] 01/13/2020, 12:08 PM  Nobie Putnam, DO [facility] 01/13/2020, 12:09 PM

## 2020-03-24 DIAGNOSIS — M9904 Segmental and somatic dysfunction of sacral region: Secondary | ICD-10-CM | POA: Diagnosis not present

## 2020-03-24 DIAGNOSIS — M461 Sacroiliitis, not elsewhere classified: Secondary | ICD-10-CM | POA: Diagnosis not present

## 2020-03-24 DIAGNOSIS — M5441 Lumbago with sciatica, right side: Secondary | ICD-10-CM | POA: Diagnosis not present

## 2020-03-24 DIAGNOSIS — M9903 Segmental and somatic dysfunction of lumbar region: Secondary | ICD-10-CM | POA: Diagnosis not present

## 2020-03-28 DIAGNOSIS — M461 Sacroiliitis, not elsewhere classified: Secondary | ICD-10-CM | POA: Diagnosis not present

## 2020-03-28 DIAGNOSIS — M9904 Segmental and somatic dysfunction of sacral region: Secondary | ICD-10-CM | POA: Diagnosis not present

## 2020-03-28 DIAGNOSIS — M9903 Segmental and somatic dysfunction of lumbar region: Secondary | ICD-10-CM | POA: Diagnosis not present

## 2020-03-28 DIAGNOSIS — M5441 Lumbago with sciatica, right side: Secondary | ICD-10-CM | POA: Diagnosis not present

## 2020-03-31 DIAGNOSIS — M461 Sacroiliitis, not elsewhere classified: Secondary | ICD-10-CM | POA: Diagnosis not present

## 2020-03-31 DIAGNOSIS — M9903 Segmental and somatic dysfunction of lumbar region: Secondary | ICD-10-CM | POA: Diagnosis not present

## 2020-03-31 DIAGNOSIS — M9904 Segmental and somatic dysfunction of sacral region: Secondary | ICD-10-CM | POA: Diagnosis not present

## 2020-03-31 DIAGNOSIS — M5441 Lumbago with sciatica, right side: Secondary | ICD-10-CM | POA: Diagnosis not present

## 2020-04-07 DIAGNOSIS — M9904 Segmental and somatic dysfunction of sacral region: Secondary | ICD-10-CM | POA: Diagnosis not present

## 2020-04-07 DIAGNOSIS — M5441 Lumbago with sciatica, right side: Secondary | ICD-10-CM | POA: Diagnosis not present

## 2020-04-07 DIAGNOSIS — M461 Sacroiliitis, not elsewhere classified: Secondary | ICD-10-CM | POA: Diagnosis not present

## 2020-04-07 DIAGNOSIS — M9903 Segmental and somatic dysfunction of lumbar region: Secondary | ICD-10-CM | POA: Diagnosis not present

## 2020-04-08 ENCOUNTER — Telehealth: Payer: Medicare PPO | Admitting: Psychiatry

## 2020-04-08 ENCOUNTER — Telehealth (HOSPITAL_COMMUNITY): Payer: Medicare PPO | Admitting: Psychiatry

## 2020-05-06 DIAGNOSIS — Z961 Presence of intraocular lens: Secondary | ICD-10-CM | POA: Diagnosis not present

## 2020-06-03 ENCOUNTER — Encounter (HOSPITAL_COMMUNITY): Payer: Self-pay | Admitting: Psychiatry

## 2020-06-03 ENCOUNTER — Other Ambulatory Visit: Payer: Self-pay

## 2020-06-03 ENCOUNTER — Telehealth (INDEPENDENT_AMBULATORY_CARE_PROVIDER_SITE_OTHER): Payer: Medicare PPO | Admitting: Psychiatry

## 2020-06-03 DIAGNOSIS — F3132 Bipolar disorder, current episode depressed, moderate: Secondary | ICD-10-CM

## 2020-06-03 DIAGNOSIS — F3170 Bipolar disorder, currently in remission, most recent episode unspecified: Secondary | ICD-10-CM

## 2020-06-03 DIAGNOSIS — F413 Other mixed anxiety disorders: Secondary | ICD-10-CM

## 2020-06-03 MED ORDER — LITHIUM CARBONATE 150 MG PO CAPS: 150.0000 mg | ORAL_CAPSULE | Freq: Two times a day (BID) | ORAL | 0 refills | Status: DC

## 2020-06-03 MED ORDER — GABAPENTIN 300 MG PO CAPS: 300.0000 mg | ORAL_CAPSULE | Freq: Two times a day (BID) | ORAL | 0 refills | Status: DC

## 2020-06-03 MED ORDER — HYDROXYZINE HCL 10 MG PO TABS: 10.0000 mg | ORAL_TABLET | Freq: Three times a day (TID) | ORAL | 0 refills | Status: DC | PRN

## 2020-06-03 MED ORDER — VENLAFAXINE HCL ER 150 MG PO CP24
150.0000 mg | ORAL_CAPSULE | Freq: Every day | ORAL | 0 refills | Status: DC
Start: 2020-06-03 — End: 2020-07-25

## 2020-06-03 MED ORDER — QUETIAPINE FUMARATE 25 MG PO TABS: 25.0000 mg | ORAL_TABLET | Freq: Every day | ORAL | 0 refills | Status: DC

## 2020-06-03 NOTE — Progress Notes (Signed)
[facility] MD/PA/NP OP Progress Note  Virtual Visit via Telephone Note  I connected with Mackenzie Ward on 06/03/20 at 10:30 AM EDT by telephone and verified that I am speaking with the correct person using two identifiers.  Location: Patient: home Provider: Clinic   I discussed the limitations, risks, security and privacy concerns of performing an evaluation and management service by telephone and the availability of in person appointments. I also discussed with the patient that there may be a patient responsible charge related to this service. The patient expressed understanding and agreed to proceed.   I provided 18 minutes of non-face-to-face time during this encounter.    06/03/2020 10:28 AM Mackenzie Ward  MRN:  [id number]  Chief Complaint:  " I am not doing well"  HPI: Patient reported that she is not doing well and is dealing with life stressors.  She stated that her mother is gravely ill and has advanced stages of dementia.  She stated that she is in hospice and she is still hanging on there.  She stated that it is very difficult to see her mother in the state.  She also informed dealing with her multiple medical conditions which are taking at home on her.  She stated that she feels guilty for not being able to do the things that she used to do back in the day.  She stated that sometimes she thinks that she is not living up to her husband's expectations due to the age-related limitations that she has now.  She stated that her husband is understanding and he himself is undergoing the same challenges of not being able to do things at the were able to do back in the day. Patient asked if she could try higher dose of Effexor.  Patient was explained that we can increase the dose of Effexor XR to 150 mg daily for optimal mood stabilization.  Visit Diagnosis:    ICD-10-CM   1. Bipolar 1 disorder, depressed, moderate ([place])  F31.32     Past Psychiatric History: Bipolar d/o, anxiety  d/o  Past Medical History:  Past Medical History:  Diagnosis Date  . Asthma   . COPD (chronic obstructive pulmonary disease) ([place])   . Depression   . High cholesterol     Past Surgical History:  Procedure Laterality Date  . BREAST SURGERY Right 2013  . CATARACT EXTRACTION W/PHACO Left 08/26/2019   Procedure: CATARACT EXTRACTION PHACO AND INTRAOCULAR LENS PLACEMENT (IOC) LEFT  4.45    00:40.5    11.0%;  Surgeon: Leandrew Koyanagi, MD;  Location: [facility];  Service: Ophthalmology;  Laterality: Left;  . CATARACT EXTRACTION W/PHACO Right 09/30/2019   Procedure: CATARACT EXTRACTION PHACO AND INTRAOCULAR LENS PLACEMENT (IOC) RIGHT 4.05  00:44.4  9.1% ;  Surgeon: Leandrew Koyanagi, MD;  Location: [facility];  Service: Ophthalmology;  Laterality: Right;  . COLONOSCOPY  15 years ago  . COLONOSCOPY WITH PROPOFOL N/A 11/10/2018   Procedure: COLONOSCOPY WITH PROPOFOL;  Surgeon: Lin Landsman, MD;  Location: [facility] ENDOSCOPY;  Service: Gastroenterology;  Laterality: N/A;  . EYE SURGERY    . TONSILLECTOMY      Family Psychiatric History: see below  Family History:  Family History  Problem Relation Age of Onset  . Lymphoma Mother        Non Hodgkin  . Dementia Mother   . Alzheimer's disease Mother   . Lymphoma Sister        Non Hodgkin  . Depression Sister   .  Heart attack Father   . Alcohol abuse Father   . Dementia Brother   . Dementia Maternal Uncle   . Alzheimer's disease Maternal Aunt   . Alzheimer's disease Paternal Aunt   . Alzheimer's disease Paternal Grandmother   . Alzheimer's disease Maternal Aunt   . Alzheimer's disease Paternal Aunt     Social History:  Social History   Socioeconomic History  . Marital status: Married    Spouse name: Not on file  . Number of children: Not on file  . Years of education: Not on file  . Highest education level: Some college, no degree  Occupational History  . Occupation: retired  Tobacco Use  . Smoking  status: Current Every Day Smoker    Packs/day: 1.00    Years: 45.00    Pack years: 45.00    Types: Cigarettes  . Smokeless tobacco: Never Used  Vaping Use  . Vaping Use: Never used  Substance and Sexual Activity  . Alcohol use: No    Alcohol/week: 0.0 standard drinks  . Drug use: No  . Sexual activity: Not Currently  Other Topics Concern  . Not on file  Social History Narrative   Goes to dinner with friends often, goes to see her mom at nursing home on a regular basis    Social Determinants of Health   Financial Resource Strain:   . Difficulty of Paying Living Expenses: Not on file  Food Insecurity:   . Worried About Charity fundraiser in the Last Year: Not on file  . Ran Out of Food in the Last Year: Not on file  Transportation Needs:   . Lack of Transportation (Medical): Not on file  . Lack of Transportation (Non-Medical): Not on file  Physical Activity:   . Days of Exercise per Week: Not on file  . Minutes of Exercise per Session: Not on file  Stress:   . Feeling of Stress : Not on file  Social Connections:   . Frequency of Communication with Friends and Family: Not on file  . Frequency of Social Gatherings with Friends and Family: Not on file  . Attends Religious Services: Not on file  . Active Member of Clubs or Organizations: Not on file  . Attends Archivist Meetings: Not on file  . Marital Status: Not on file    Allergies: No Known Allergies  Metabolic Disorder Labs: Lab Results  Component Value Date   HGBA1C 5.5 11/27/2018   MPG 111 11/27/2018   MPG 111 10/15/2017   No results found for: PROLACTIN Lab Results  Component Value Date   CHOL 275 (H) 11/27/2018   TRIG 240 (H) 11/27/2018   HDL 56 11/27/2018   CHOLHDL 4.9 11/27/2018   VLDL 27 11/05/2016   LDLCALC 178 (H) 11/27/2018   LDLCALC 145 (H) 10/15/2017   Lab Results  Component Value Date   TSH 4.47 11/27/2018   TSH 3.37 10/15/2017    Therapeutic Level Labs: No results found  for: LITHIUM No results found for: VALPROATE No components found for:  CBMZ  Current Medications: Current Outpatient Medications  Medication Sig Dispense Refill  . albuterol (PROVENTIL) (2.5 MG/3ML) 0.083% nebulizer solution Take 3 mLs (2.5 mg total) by nebulization every 6 (six) hours as needed for wheezing or shortness of breath. 60 mL 1  . gabapentin (NEURONTIN) 300 MG capsule Take 1 capsule (300 mg total) by mouth 2 (two) times daily. 180 capsule 0  . hydrOXYzine (ATARAX/VISTARIL) 10 MG tablet Take 1 tablet (  10 mg total) by mouth 3 (three) times daily as needed for anxiety. 270 tablet 0  . lithium carbonate 150 MG capsule Take 1 capsule (150 mg total) by mouth 2 (two) times daily with a meal. 180 capsule 0  . Nutritional Supplements (VITAMIN D BOOSTER PO) Take by mouth.    . QUEtiapine (SEROQUEL) 25 MG tablet Take 1 tablet (25 mg total) by mouth at bedtime. 90 tablet 2  . tiotropium (SPIRIVA HANDIHALER) 18 MCG inhalation capsule Place 1 capsule (18 mcg total) into inhaler and inhale daily. 90 capsule 3  . venlafaxine XR (EFFEXOR XR) 75 MG 24 hr capsule Take 1 capsule (75 mg total) by mouth daily with breakfast. 90 capsule 0   No current facility-administered medications for this visit.    Musculoskeletal: Strength & Muscle Tone: unable to assess due to telemed visit Gait & Station: unable to assess due to telemed visit Patient leans: unable to assess due to telemed visit   Psychiatric Specialty Exam: Review of Systems  There were no vitals taken for this visit.There is no height or weight on file to calculate BMI.  General Appearance: unable to assess due to phone visit  Eye Contact:  unable to assess due to phone visit  Speech:  Clear and Coherent and Normal Rate  Volume:  Normal  Mood:  Depressed  Affect:  Congruent  Thought Process:  Goal Directed, Linear and Descriptions of Associations: Intact  Orientation:  Full (Time, Place, and Person)  Thought Content: Logical    Suicidal Thoughts:  No  Homicidal Thoughts:  No  Memory:  Recent;   Good Remote;   Good  Judgement:  Good  Insight:  Good  Psychomotor Activity:  Normal  Concentration:  Concentration: Good and Attention Span: Good  Recall:  Good  Fund of Knowledge: Good  Language: Good  Akathisia:  Negative  Handed:  Right  AIMS (if indicated): not done  Assets:  Communication Skills Desire for Improvement Financial Resources/Insurance Housing  ADL's:  Intact  Cognition: WNL  Sleep:  Fair with help of Seroquel     Screenings: GAD-7     Office Visit from 01/07/2020 in [facility]  Total GAD-7 Score 3    PHQ2-9     Office Visit from 01/07/2020 in [place] from 11/03/2019 in [place] from 10/28/2018 in [facility] Office Visit from 07/31/2018 in [place] from 10/22/2017 in [place]  PHQ-2 Total Score 3 1 0 0 3  PHQ-9 Total Score 12 -- -- 0 6       Assessment and Plan: 74 year old female with history of bipolar disorder, COPD seen for follow-up.  She reported several ongoing stressors causing her to feel depressed.   1. Bipolar 1 disorder, depressed, moderate ([facility])  - gabapentin (NEURONTIN) 300 MG capsule; Take 1 capsule (300 mg total) by mouth 2 (two) times daily.  Dispense: 180 capsule; Refill: 0 - lithium carbonate 150 MG capsule; Take 1 capsule (150 mg total) by mouth 2 (two) times daily with a meal.  Dispense: 180 capsule; Refill: 0 - QUEtiapine (SEROQUEL) 25 MG tablet; Take 1 tablet (25 mg total) by mouth at bedtime.  Dispense: 90 tablet; Refill: 0 - Increase Effexor XR 150 mg qam.  2. Other mixed anxiety disorders  - hydrOXYzine (ATARAX/VISTARIL) 10 MG tablet; Take 1 tablet (10 mg total) by mouth 3 (three) times daily as needed for anxiety.  Dispense: 270 tablet; Refill: 0   Follow up in 2 months.   N[place] Crane,  MD 06/03/2020, 10:28 AM

## 2020-07-11 ENCOUNTER — Ambulatory Visit: Payer: Medicare PPO | Admitting: Family Medicine

## 2020-07-18 ENCOUNTER — Other Ambulatory Visit: Payer: Self-pay

## 2020-07-18 ENCOUNTER — Encounter: Payer: Self-pay | Admitting: Family Medicine

## 2020-07-18 ENCOUNTER — Ambulatory Visit: Payer: Medicare PPO | Admitting: Family Medicine

## 2020-07-18 VITALS — BP 120/57 | HR 77 | Temp 97.5°F | Resp 16 | Ht <= 58 in | Wt 148.6 lb

## 2020-07-18 DIAGNOSIS — H8113 Benign paroxysmal vertigo, bilateral: Secondary | ICD-10-CM

## 2020-07-18 DIAGNOSIS — F3170 Bipolar disorder, currently in remission, most recent episode unspecified: Secondary | ICD-10-CM

## 2020-07-18 DIAGNOSIS — G8929 Other chronic pain: Secondary | ICD-10-CM | POA: Diagnosis not present

## 2020-07-18 DIAGNOSIS — Z23 Encounter for immunization: Secondary | ICD-10-CM | POA: Diagnosis not present

## 2020-07-18 DIAGNOSIS — M5441 Lumbago with sciatica, right side: Secondary | ICD-10-CM | POA: Diagnosis not present

## 2020-07-18 DIAGNOSIS — J432 Centrilobular emphysema: Secondary | ICD-10-CM

## 2020-07-18 NOTE — Progress Notes (Signed)
Subjective:    Patient ID: Mackenzie Ward, female    DOB: 1946/05/30, 73 y.o.   MRN: [id number]  Mackenzie Ward is a 74 y.o. female presenting on 07/18/2020 for Back Pain   HPI   FOLLOW-UPCOPD(Mild Emphysema) Previous visit for COPD follow-up, continued on Spiriva daily and had LDCT for Lung CA Screen, see prior notes for background information. - Interval update has improved on Spiriva, would like refill now. - Today patient reportsdoing well. No recent flare. - She can tolerate regular activity without dyspnea. -Continues on Spiriva once daily - Rarely using Albuterol nebulizer once every 6 monthsexcept flare up -She has never seen Pulmonology or had PFT, and she is not interested at this time. Denies any worsening dyspnea, chest pain or tightness, productive cough, wheezing  Chronic Low Back Pain, OA/DJD / Sciatica R sided Chronic problem Associated symptoms RLS and RLE extremity pain and radiating nerve pain Affecting her gait at times. Gait - Taking Gabapentin 300mg  nightly for RLS helping her symptoms - bottom of feet, cold burn prickle occasionally She has temporary handicap placard, it will expire in February 2022, she will request a new one for longer term after that time.  Vertigo She reports difficulty with episodic dizziness and room spinning movement. Worse with head position laying down and getting up movements. Temporary episodes Intermittent problem. Not every day.   Bipolar Disorder in remission  Followed by [facility], Dr Toy Care - next apt 1 week on Monday  Update PHQ GAD On med management Venlafaxine 75mg  daily, Quetiapine 25mg  nightly, Lithium 150mg  BID, Hydroxyzine PRN  Health Maintenance: UTD COVID19 vaccine  Due for Flu Shot, will receive today    Depression screen [facility] 2/9 07/19/2020 01/07/2020 11/03/2019  Decreased Interest 0 3 0  Down, Depressed, Hopeless 2 0 1  PHQ - 2 Score 2 3 1   Altered sleeping 0 0 -  Tired, decreased  energy 3 3 -  Change in appetite 1 2 -  Feeling bad or failure about yourself  0 0 -  Trouble concentrating 1 1 -  Moving slowly or fidgety/restless 1 3 -  Suicidal thoughts 0 0 -  PHQ-9 Score 8 12 -  Difficult doing work/chores Somewhat difficult Somewhat difficult -  Some recent data might be hidden    Social History   Tobacco Use  . Smoking status: Current Every Day Smoker    Packs/day: 1.00    Years: 45.00    Pack years: 45.00    Types: Cigarettes  . Smokeless tobacco: Never Used  Vaping Use  . Vaping Use: Never used  Substance Use Topics  . Alcohol use: No    Alcohol/week: 0.0 standard drinks  . Drug use: No    Review of Systems Per HPI unless specifically indicated above     Objective:    BP (!) 120/57   Pulse 77   Temp (!) 97.5 F (36.4 C)   Resp 16   Ht 4\' 10"  (1.473 m)   Wt 148 lb 9.6 oz (67.4 kg)   SpO2 98%   BMI 31.06 kg/m   Wt Readings from Last 3 Encounters:  07/18/20 148 lb 9.6 oz (67.4 kg)  01/07/20 149 lb (67.6 kg)  11/16/19 147 lb (66.7 kg)    Physical Exam Vitals and nursing note reviewed.  Constitutional:      General: She is not in acute distress.    Appearance: She is well-developed. She is not diaphoretic.     Comments: Well-appearing, comfortable,  cooperative  HENT:     Head: Normocephalic and atraumatic.  Eyes:     General:        Right eye: No discharge.        Left eye: No discharge.     Conjunctiva/sclera: Conjunctivae normal.  Neck:     Thyroid: No thyromegaly.  Cardiovascular:     Rate and Rhythm: Normal rate and regular rhythm.     Heart sounds: Normal heart sounds. No murmur heard.   Pulmonary:     Effort: Pulmonary effort is normal. No respiratory distress.     Breath sounds: Normal breath sounds. No wheezing or rales.  Musculoskeletal:        General: Normal range of motion.     Cervical back: Normal range of motion and neck supple.  Lymphadenopathy:     Cervical: No cervical adenopathy.  Skin:    General:  Skin is warm and dry.     Findings: No erythema or rash.  Neurological:     Mental Status: She is alert and oriented to person, place, and time.  Psychiatric:        Behavior: Behavior normal.     Comments: Well groomed, good eye contact, normal speech and thoughts      DG Lumbar Spine CompletePerformed 01/07/2020 Final result  Study Result CLINICAL DATA: Chronic low back pain, right-sided sciatica, no injury  EXAM: LUMBAR SPINE - COMPLETE 4+ VIEW  COMPARISON: None.  FINDINGS: No fracture or dislocation of the lumbar spine. Disc spaces are generally preserved. Mild facet degenerative change of the lower lumbar levels. Aortic atherosclerosis. Nonobstructive pattern of bowel gas.  IMPRESSION: No fracture or dislocation of the lumbar spine. Disc spaces are generally preserved. Mild facet degenerative change of the lower lumbar levels. Lumbar disc and neural foraminal pathology may be further evaluated by MRI if indicated by localizing neurological signs and symptoms.   Electronically Signed By: Eddie Candle M.D. On: 01/07/2020 15:58   Results for orders placed or performed during the hospital encounter of 09/28/19  SARS CORONAVIRUS 2 (TAT 6-24 HRS) Nasopharyngeal Nasopharyngeal Swab   Specimen: Nasopharyngeal Swab  Result Value Ref Range   SARS Coronavirus 2 NEGATIVE NEGATIVE      Assessment & Plan:   Problem List Items Addressed This Visit    Chronic right-sided low back pain with right-sided sciatica   Centrilobular emphysema (HCC)   Bipolar disorder in remission ([place]) - Primary    Other Visit Diagnoses    Needs flu shot       Relevant Orders   Flu Vaccine QUAD High Dose(Fluad) (Completed)   BPPV (benign paroxysmal positional vertigo), bilateral          #Chronic Back Pain Controlled / improved on Gabapentin current regimen Has handicap placard will renew at next visit or in Feb 2022 if need  #Bipolar in remission Followed by psychiatry [facility] On med  management  #COPD, emphysema Controlled currently Refill Spiriva  No orders of the defined types were placed in this encounter.    Follow up plan: Return in about 6 months (around 01/15/2021) for 6 month fasting lab only then 1 week later Annual Physical.  Future labs ordered 01/2021  Nobie Putnam, [facility] 07/18/2020, 11:37 AM

## 2020-07-18 NOTE — Patient Instructions (Addendum)
Thank you for coming to the office today.  Please contact our office in February 2022 when ready for new Handicap placard for more permanent one that can last up to 5 years.  Flu shot today.  You have symptoms of Vertigo (Benign Paroxysmal Positional Vertigo) - This is commonly caused by inner ear fluid imbalance, sometimes can be worsened by allergies and sinus symptoms, otherwise it can occur randomly sometimes and we may never discover the exact cause. - To treat this, try the Epley Manuever (see diagrams/instructions below) at home up to 3 times a day for 1-2 weeks or until symptoms resolve  Can consider dramamine or meclizine OTC for dizziness  If you develop significant worsening episode with vertigo that does not improve and you get severe headache, loss of vision, arm or leg weakness, slurred speech, or other concerning symptoms please seek immediate medical attention at Emergency Department.  DUE for FASTING BLOOD WORK (no food or drink after midnight before the lab appointment, only water or coffee without cream/sugar on the morning of)  SCHEDULE "Lab Only" visit in the morning at the clinic for lab draw in 6 MONTHS   - Make sure Lab Only appointment is at about 1 week before your next appointment, so that results will be available  For Lab Results, once available within 2-3 days of blood draw, you can can log in to MyChart online to view your results and a brief explanation. Also, we can discuss results at next follow-up visit.   Please schedule a Follow-up Appointment to: Return in about 6 months (around 01/15/2021) for 6 month fasting lab only then 1 week later Annual Physical.  If you have any other questions or concerns, please feel free to call the office or send a message through [place]. You may also schedule an earlier appointment if necessary.  Additionally, you may be receiving a survey about your experience at our office within a few days to 1 week by e-mail or mail. We  value your feedback.  Nobie Putnam, [facility], [facility]   Please schedule a follow-up appointment with Dr Parks Ranger within 4 weeks if Vertigo not improving, and will consider Referral to Vestibular Rehab  See the next page for images describing the Epley Manuever.     ----------------------------------------------------------------------------------------------------------------------

## 2020-07-19 ENCOUNTER — Other Ambulatory Visit: Payer: Self-pay | Admitting: Family Medicine

## 2020-07-19 DIAGNOSIS — R7309 Other abnormal glucose: Secondary | ICD-10-CM

## 2020-07-19 DIAGNOSIS — Z0000 Encounter for general adult medical examination without abnormal findings: Secondary | ICD-10-CM

## 2020-07-19 DIAGNOSIS — E559 Vitamin D deficiency, unspecified: Secondary | ICD-10-CM

## 2020-07-19 DIAGNOSIS — F3170 Bipolar disorder, currently in remission, most recent episode unspecified: Secondary | ICD-10-CM

## 2020-07-19 DIAGNOSIS — J432 Centrilobular emphysema: Secondary | ICD-10-CM

## 2020-07-19 DIAGNOSIS — E782 Mixed hyperlipidemia: Secondary | ICD-10-CM

## 2020-07-19 DIAGNOSIS — Z79899 Other long term (current) drug therapy: Secondary | ICD-10-CM

## 2020-07-25 ENCOUNTER — Other Ambulatory Visit: Payer: Self-pay

## 2020-07-25 ENCOUNTER — Telehealth (INDEPENDENT_AMBULATORY_CARE_PROVIDER_SITE_OTHER): Payer: Medicare PPO | Admitting: Psychiatry

## 2020-07-25 ENCOUNTER — Encounter (HOSPITAL_COMMUNITY): Payer: Self-pay | Admitting: Psychiatry

## 2020-07-25 DIAGNOSIS — F413 Other mixed anxiety disorders: Secondary | ICD-10-CM

## 2020-07-25 DIAGNOSIS — F3170 Bipolar disorder, currently in remission, most recent episode unspecified: Secondary | ICD-10-CM | POA: Diagnosis not present

## 2020-07-25 DIAGNOSIS — F3132 Bipolar disorder, current episode depressed, moderate: Secondary | ICD-10-CM | POA: Insufficient documentation

## 2020-07-25 MED ORDER — GABAPENTIN 300 MG PO CAPS: 300.0000 mg | ORAL_CAPSULE | Freq: Two times a day (BID) | ORAL | 0 refills | Status: DC

## 2020-07-25 MED ORDER — LITHIUM CARBONATE 150 MG PO CAPS: 150.0000 mg | ORAL_CAPSULE | Freq: Two times a day (BID) | ORAL | 0 refills | Status: DC

## 2020-07-25 MED ORDER — QUETIAPINE FUMARATE 25 MG PO TABS: 25.0000 mg | ORAL_TABLET | Freq: Every day | ORAL | 0 refills | Status: DC

## 2020-07-25 MED ORDER — HYDROXYZINE HCL 10 MG PO TABS: 10.0000 mg | ORAL_TABLET | Freq: Three times a day (TID) | ORAL | 0 refills | Status: DC | PRN

## 2020-07-25 MED ORDER — VENLAFAXINE HCL ER 150 MG PO CP24: 150.0000 mg | ORAL_CAPSULE | Freq: Every day | ORAL | 0 refills | Status: DC

## 2020-07-25 NOTE — Progress Notes (Signed)
[place] MD/PA/NP OP Progress Note  Virtual Visit via Telephone Note  I connected with Mackenzie Ward on 07/25/20 at  2:20 PM EST by telephone and verified that I am speaking with the correct person using two identifiers.  Location: Patient: home Provider: Clinic   I discussed the limitations, risks, security and privacy concerns of performing an evaluation and management service by telephone and the availability of in person appointments. I also discussed with the patient that there may be a patient responsible charge related to this service. The patient expressed understanding and agreed to proceed.   I provided 15 minutes of non-face-to-face time during this encounter.    07/25/2020 2:29 PM Mackenzie Ward  MRN:  [id number]  Chief Complaint:  " I am doing okay."  HPI: Patient reported that she is doing better now.  She informed that her mother is still hanging in there and has completely lost her memory.  She does not remember who the patient or her sister are.  She informed that she found increasing dose of Effexor to be helpful.  She continues to do with her physical limitations associated with aging. She stated that some things are the way they are and she knows she cannot change everything. She denied having any specific plans for Thanksgiving but is proud that she has already done her Christmas shopping early this year.   Visit Diagnosis:    ICD-10-CM   1. Bipolar 1 disorder, depressed, moderate ([place])  F31.32   2. Other mixed anxiety disorders  F41.3     Past Psychiatric History: Bipolar d/o, anxiety d/o  Past Medical History:  Past Medical History:  Diagnosis Date  . Asthma   . COPD (chronic obstructive pulmonary disease) ([place])   . Depression   . High cholesterol     Past Surgical History:  Procedure Laterality Date  . BREAST SURGERY Right 2013  . CATARACT EXTRACTION W/PHACO Left 08/26/2019   Procedure: CATARACT EXTRACTION PHACO AND INTRAOCULAR LENS PLACEMENT  (IOC) LEFT  4.45    00:40.5    11.0%;  Surgeon: Leandrew Koyanagi, MD;  Location: [facility];  Service: Ophthalmology;  Laterality: Left;  . CATARACT EXTRACTION W/PHACO Right 09/30/2019   Procedure: CATARACT EXTRACTION PHACO AND INTRAOCULAR LENS PLACEMENT (IOC) RIGHT 4.05  00:44.4  9.1% ;  Surgeon: Leandrew Koyanagi, MD;  Location: [facility];  Service: Ophthalmology;  Laterality: Right;  . COLONOSCOPY  15 years ago  . COLONOSCOPY WITH PROPOFOL N/A 11/10/2018   Procedure: COLONOSCOPY WITH PROPOFOL;  Surgeon: Lin Landsman, MD;  Location: [facility];  Service: Gastroenterology;  Laterality: N/A;  . EYE SURGERY    . TONSILLECTOMY      Family Psychiatric History: see below  Family History:  Family History  Problem Relation Age of Onset  . Lymphoma Mother        Non Hodgkin  . Dementia Mother   . Alzheimer's disease Mother   . Lymphoma Sister        Non Hodgkin  . Depression Sister   . Heart attack Father   . Alcohol abuse Father   . Dementia Brother   . Dementia Maternal Uncle   . Alzheimer's disease Maternal Aunt   . Alzheimer's disease Paternal Aunt   . Alzheimer's disease Paternal Grandmother   . Alzheimer's disease Maternal Aunt   . Alzheimer's disease Paternal Aunt     Social History:  Social History   Socioeconomic History  . Marital status: Married    Spouse name:  Not on file  . Number of children: Not on file  . Years of education: Not on file  . Highest education level: Some college, no degree  Occupational History  . Occupation: retired  Tobacco Use  . Smoking status: Current Every Day Smoker    Packs/day: 1.00    Years: 45.00    Pack years: 45.00    Types: Cigarettes  . Smokeless tobacco: Never Used  Vaping Use  . Vaping Use: Never used  Substance and Sexual Activity  . Alcohol use: No    Alcohol/week: 0.0 standard drinks  . Drug use: No  . Sexual activity: Not Currently  Other Topics Concern  . Not on file  Social  History Narrative   Goes to dinner with friends often, goes to see her mom at nursing home on a regular basis    Social Determinants of Health   Financial Resource Strain:   . Difficulty of Paying Living Expenses: Not on file  Food Insecurity:   . Worried About Charity fundraiser in the Last Year: Not on file  . Ran Out of Food in the Last Year: Not on file  Transportation Needs:   . Lack of Transportation (Medical): Not on file  . Lack of Transportation (Non-Medical): Not on file  Physical Activity:   . Days of Exercise per Week: Not on file  . Minutes of Exercise per Session: Not on file  Stress:   . Feeling of Stress : Not on file  Social Connections:   . Frequency of Communication with Friends and Family: Not on file  . Frequency of Social Gatherings with Friends and Family: Not on file  . Attends Religious Services: Not on file  . Active Member of Clubs or Organizations: Not on file  . Attends Archivist Meetings: Not on file  . Marital Status: Not on file    Allergies: No Known Allergies  Metabolic Disorder Labs: Lab Results  Component Value Date   HGBA1C 5.5 11/27/2018   MPG 111 11/27/2018   MPG 111 10/15/2017   No results found for: PROLACTIN Lab Results  Component Value Date   CHOL 275 (H) 11/27/2018   TRIG 240 (H) 11/27/2018   HDL 56 11/27/2018   CHOLHDL 4.9 11/27/2018   VLDL 27 11/05/2016   LDLCALC 178 (H) 11/27/2018   LDLCALC 145 (H) 10/15/2017   Lab Results  Component Value Date   TSH 4.47 11/27/2018   TSH 3.37 10/15/2017    Therapeutic Level Labs: No results found for: LITHIUM No results found for: VALPROATE No components found for:  CBMZ  Current Medications: Current Outpatient Medications  Medication Sig Dispense Refill  . albuterol (PROVENTIL) (2.5 MG/3ML) 0.083% nebulizer solution Take 3 mLs (2.5 mg total) by nebulization every 6 (six) hours as needed for wheezing or shortness of breath. 60 mL 1  . gabapentin (NEURONTIN) 300  MG capsule Take 1 capsule (300 mg total) by mouth 2 (two) times daily. 180 capsule 0  . hydrOXYzine (ATARAX/VISTARIL) 10 MG tablet Take 1 tablet (10 mg total) by mouth 3 (three) times daily as needed for anxiety. 270 tablet 0  . lithium carbonate 150 MG capsule Take 1 capsule (150 mg total) by mouth 2 (two) times daily with a meal. 180 capsule 0  . Nutritional Supplements (VITAMIN D BOOSTER PO) Take by mouth.    . QUEtiapine (SEROQUEL) 25 MG tablet Take 1 tablet (25 mg total) by mouth at bedtime. 90 tablet 0  . tiotropium (SPIRIVA  HANDIHALER) 18 MCG inhalation capsule Place 1 capsule (18 mcg total) into inhaler and inhale daily. 90 capsule 3  . venlafaxine XR (EFFEXOR-XR) 150 MG 24 hr capsule Take 1 capsule (150 mg total) by mouth daily with breakfast. (Patient taking differently: Take 150 mg by mouth daily with breakfast. As per patient taking 2 capsule daily.) 90 capsule 0   No current facility-administered medications for this visit.    Musculoskeletal: Strength & Muscle Tone: unable to assess due to telemed visit Gait & Station: unable to assess due to telemed visit Patient leans: unable to assess due to telemed visit   Psychiatric Specialty Exam: Review of Systems  There were no vitals taken for this visit.There is no height or weight on file to calculate BMI.  General Appearance: unable to assess due to phone visit  Eye Contact:  unable to assess due to phone visit  Speech:  Clear and Coherent and Normal Rate  Volume:  Normal  Mood: Euthymic  Affect:  Congruent  Thought Process:  Goal Directed, Linear and Descriptions of Associations: Intact  Orientation:  Full (Time, Place, and Person)  Thought Content: Logical   Suicidal Thoughts:  No  Homicidal Thoughts:  No  Memory:  Recent;   Good Remote;   Good  Judgement:  Good  Insight:  Good  Psychomotor Activity:  Normal  Concentration:  Concentration: Good and Attention Span: Good  Recall:  Good  Fund of Knowledge: Good   Language: Good  Akathisia:  Negative  Handed:  Right  AIMS (if indicated): not done  Assets:  Communication Skills Desire for Improvement Financial Resources/Insurance Housing  ADL's:  Intact  Cognition: WNL  Sleep:  Fair with help of Seroquel     Screenings: GAD-7     [facility] Visit from [facility]/07/2020 in [facility]  Total GAD-7 Score 3    PHQ2-9     Office Visit from 07/18/2020 in [facility] Office Visit from 01/07/2020 in [place] from 11/03/2019 in [place] [facility] from 07/31/2018 in [place]  PHQ-2 Total Score 2 3 1  0 0  PHQ-9 Total Score 8 12 -- -- 0       Assessment and Plan: Pt appears to be stable for now.  1. Other mixed anxiety disorders  - hydrOXYzine (ATARAX/VISTARIL) 10 MG tablet; Take 1 tablet (10 mg total) by mouth 3 (three) times daily as needed for anxiety.  Dispense: 270 tablet; Refill: 0  2. Bipolar disorder in remission ([place])  - lithium carbonate 150 MG capsule; Take 1 capsule (150 mg total) by mouth 2 (two) times daily with a meal.  Dispense: 180 capsule; Refill: 0 - QUEtiapine (SEROQUEL) 25 MG tablet; Take 1 tablet (25 mg total) by mouth at bedtime.  Dispense: 90 tablet; Refill: 0 - gabapentin (NEURONTIN) 300 MG capsule; Take 1 capsule (300 mg total) by mouth 2 (two) times daily.  Dispense: 180 capsule; Refill: 0 - venlafaxine XR (EFFEXOR-XR) 150 MG 24 hr capsule; Take 1 capsule (150 mg total) by mouth daily with breakfast.  Dispense: 90 capsule; Refill: 0  Continue same medication regimen. Follow up in 3 months.    [place] Crane, MD 07/25/2020, 2:29 PM

## 2020-10-24 ENCOUNTER — Telehealth (HOSPITAL_COMMUNITY): Payer: Medicare PPO | Admitting: Psychiatry

## 2020-11-07 ENCOUNTER — Encounter (HOSPITAL_COMMUNITY): Payer: Self-pay | Admitting: Psychiatry

## 2020-11-07 ENCOUNTER — Other Ambulatory Visit: Payer: Self-pay

## 2020-11-07 ENCOUNTER — Telehealth (INDEPENDENT_AMBULATORY_CARE_PROVIDER_SITE_OTHER): Payer: Medicare PPO | Admitting: Psychiatry

## 2020-11-07 DIAGNOSIS — F413 Other mixed anxiety disorders: Secondary | ICD-10-CM

## 2020-11-07 DIAGNOSIS — F3170 Bipolar disorder, currently in remission, most recent episode unspecified: Secondary | ICD-10-CM | POA: Diagnosis not present

## 2020-11-07 MED ORDER — GABAPENTIN 300 MG PO CAPS: 300.0000 mg | ORAL_CAPSULE | Freq: Two times a day (BID) | ORAL | 0 refills | Status: DC

## 2020-11-07 MED ORDER — LITHIUM CARBONATE 150 MG PO CAPS: 150.0000 mg | ORAL_CAPSULE | Freq: Two times a day (BID) | ORAL | 0 refills | Status: DC

## 2020-11-07 MED ORDER — HYDROXYZINE HCL 10 MG PO TABS: 10.0000 mg | ORAL_TABLET | Freq: Three times a day (TID) | ORAL | 0 refills | Status: DC | PRN

## 2020-11-07 MED ORDER — QUETIAPINE FUMARATE 25 MG PO TABS: 25.0000 mg | ORAL_TABLET | Freq: Every day | ORAL | 0 refills | Status: DC

## 2020-11-07 MED ORDER — VENLAFAXINE HCL ER 150 MG PO CP24: 150.0000 mg | ORAL_CAPSULE | Freq: Every day | ORAL | 0 refills | Status: DC

## 2020-11-07 NOTE — Progress Notes (Signed)
BH MD/PA/NP OP Progress Note  Virtual Visit via Telephone Note  I connected with Mackenzie Ward on 11/07/20 at  1:40 PM EST by telephone and verified that I am speaking with the correct person using two identifiers.  Location: Patient: home Provider: Clinic   I discussed the limitations, risks, security and privacy concerns of performing an evaluation and management service by telephone and the availability of in person appointments. I also discussed with the patient that there may be a patient responsible charge related to this service. The patient expressed understanding and agreed to proceed.   I provided 17 minutes of non-face-to-face time during this encounter.    11/07/2020 1:55 PM Mackenzie Ward  MRN:  [id number]  Chief Complaint:  " My mother passed away last week."  HPI: Patient informed that she was glad that the writer called her today because her mother just passed away recently.  She stated that she and her sister have been taking care of the funeral arrangements and everything.  She informed that her mother was suffering from dementia for last 10+ years and stated that in a way she believed her mom was held as a Engineer, civil (consulting) in her body.  She was living in the nursing home and so she and her sister were able to see her as soon as they got a phone call. She stated that in the way she had her sister to leave because now they know that her mother is in happier place.  Overall patient feels she is handling this grief well. She informed that she has a lot of cousins as her mother was 47 of 56 and therefore she has a lot of support.  Her sister has been her biggest support her through all this. She does not believe she needs to adjust or change any of her medications at this point.  Visit Diagnosis:    ICD-10-CM   1. Bipolar disorder in remission ([place])  F31.70   2. Other mixed anxiety disorders  F41.3     Past Psychiatric History: Bipolar d/o, anxiety d/o  Past Medical  History:  Past Medical History:  Diagnosis Date  . Asthma   . COPD (chronic obstructive pulmonary disease) ([place])   . Depression   . High cholesterol     Past Surgical History:  Procedure Laterality Date  . BREAST SURGERY Right 2013  . CATARACT EXTRACTION W/PHACO Left 08/26/2019   Procedure: CATARACT EXTRACTION PHACO AND INTRAOCULAR LENS PLACEMENT (IOC) LEFT  4.45    00:40.5    11.0%;  Surgeon: Leandrew Koyanagi, MD;  Location: [facility];  Service: Ophthalmology;  Laterality: Left;  . CATARACT EXTRACTION W/PHACO Right 09/30/2019   Procedure: CATARACT EXTRACTION PHACO AND INTRAOCULAR LENS PLACEMENT (IOC) RIGHT 4.05  00:44.4  9.1% ;  Surgeon: Leandrew Koyanagi, MD;  Location: [facility];  Service: Ophthalmology;  Laterality: Right;  . COLONOSCOPY  15 years ago  . COLONOSCOPY WITH PROPOFOL N/A 11/10/2018   Procedure: COLONOSCOPY WITH PROPOFOL;  Surgeon: Lin Landsman, MD;  Location: [facility] ENDOSCOPY;  Service: Gastroenterology;  Laterality: N/A;  . EYE SURGERY    . TONSILLECTOMY      Family Psychiatric History: see below  Family History:  Family History  Problem Relation Age of Onset  . Lymphoma Mother        Non Hodgkin  . Dementia Mother   . Alzheimer's disease Mother   . Lymphoma Sister        Non Hodgkin  . Depression Sister   .  Heart attack Father   . Alcohol abuse Father   . Dementia Brother   . Dementia Maternal Uncle   . Alzheimer's disease Maternal Aunt   . Alzheimer's disease Paternal Aunt   . Alzheimer's disease Paternal Grandmother   . Alzheimer's disease Maternal Aunt   . Alzheimer's disease Paternal Aunt     Social History:  Social History   Socioeconomic History  . Marital status: Married    Spouse name: Not on file  . Number of children: Not on file  . Years of education: Not on file  . Highest education level: Some college, no degree  Occupational History  . Occupation: retired  Tobacco Use  . Smoking status: Current  Every Day Smoker    Packs/day: 1.00    Years: 45.00    Pack years: 45.00    Types: Cigarettes  . Smokeless tobacco: Never Used  Vaping Use  . Vaping Use: Never used  Substance and Sexual Activity  . Alcohol use: No    Alcohol/week: 0.0 standard drinks  . Drug use: No  . Sexual activity: Not Currently  Other Topics Concern  . Not on file  Social History Narrative   Goes to dinner with friends often, goes to see her mom at nursing home on a regular basis    Social Determinants of Health   Financial Resource Strain: Not on file  Food Insecurity: Not on file  Transportation Needs: Not on file  Physical Activity: Not on file  Stress: Not on file  Social Connections: Not on file    Allergies: No Known Allergies  Metabolic Disorder Labs: Lab Results  Component Value Date   HGBA1C 5.5 11/27/2018   MPG 111 11/27/2018   MPG 111 10/15/2017   No results found for: PROLACTIN Lab Results  Component Value Date   CHOL 275 (H) 11/27/2018   TRIG 240 (H) 11/27/2018   HDL 56 11/27/2018   CHOLHDL 4.9 11/27/2018   VLDL 27 11/05/2016   LDLCALC 178 (H) 11/27/2018   LDLCALC 145 (H) 10/15/2017   Lab Results  Component Value Date   TSH 4.47 11/27/2018   TSH 3.37 10/15/2017    Therapeutic Level Labs: No results found for: LITHIUM No results found for: VALPROATE No components found for:  CBMZ  Current Medications: Current Outpatient Medications  Medication Sig Dispense Refill  . albuterol (PROVENTIL) (2.5 MG/3ML) 0.083% nebulizer solution Take 3 mLs (2.5 mg total) by nebulization every 6 (six) hours as needed for wheezing or shortness of breath. 60 mL 1  . gabapentin (NEURONTIN) 300 MG capsule Take 1 capsule (300 mg total) by mouth 2 (two) times daily. 180 capsule 0  . hydrOXYzine (ATARAX/VISTARIL) 10 MG tablet Take 1 tablet (10 mg total) by mouth 3 (three) times daily as needed for anxiety. 270 tablet 0  . lithium carbonate 150 MG capsule Take 1 capsule (150 mg total) by mouth 2  (two) times daily with a meal. 180 capsule 0  . Nutritional Supplements (VITAMIN D BOOSTER PO) Take by mouth.    . QUEtiapine (SEROQUEL) 25 MG tablet Take 1 tablet (25 mg total) by mouth at bedtime. 90 tablet 0  . tiotropium (SPIRIVA HANDIHALER) 18 MCG inhalation capsule Place 1 capsule (18 mcg total) into inhaler and inhale daily. 90 capsule 3  . venlafaxine XR (EFFEXOR-XR) 150 MG 24 hr capsule Take 1 capsule (150 mg total) by mouth daily with breakfast. 90 capsule 0   No current facility-administered medications for this visit.    Musculoskeletal:  Strength & Muscle Tone: unable to assess due to telemed visit Gait & Station: unable to assess due to telemed visit Patient leans: unable to assess due to telemed visit   Psychiatric Specialty Exam: Review of Systems  There were no vitals taken for this visit.There is no height or weight on file to calculate BMI.  General Appearance: unable to assess due to phone visit  Eye Contact:  unable to assess due to phone visit  Speech:  Clear and Coherent and Normal Rate  Volume:  Normal  Mood: Euthymic  Affect:  Congruent  Thought Process:  Goal Directed, Linear and Descriptions of Associations: Intact  Orientation:  Full (Time, Place, and Person)  Thought Content: Logical   Suicidal Thoughts:  No  Homicidal Thoughts:  No  Memory:  Recent;   Good Remote;   Good  Judgement:  Good  Insight:  Good  Psychomotor Activity:  Normal  Concentration:  Concentration: Good and Attention Span: Good  Recall:  Good  Fund of Knowledge: Good  Language: Good  Akathisia:  Negative  Handed:  Right  AIMS (if indicated): not done  Assets:  Communication Skills Desire for Improvement Financial Resources/Insurance Housing  ADL's:  Intact  Cognition: WNL  Sleep:  Fair with help of Seroquel     Screenings: GAD-7   Flowsheet Row Office Visit from 01/07/2020 in [facility]  Total GAD-7 Score 3    PHQ2-9   Flowsheet Row Video Visit  from 11/07/2020 in [facility] Office Visit from 07/18/2020 in [facility] Office Visit from 01/07/2020 in [place] from 11/03/2019 in [facility] from 10/28/2018 in [place]  PHQ-2 Total Score 2 2 3 1  0  PHQ-9 Total Score 4 8 12  - -    Flowsheet Row Video Visit from 11/07/2020 in [facility] No Risk       Assessment and Plan: Patient lost her mother recently.  She has been dealing with the grief fairly well for now.  She denied any other concerns at this time.  1. Bipolar disorder in remission ([place])  - lithium carbonate 150 MG capsule; Take 1 capsule (150 mg total) by mouth 2 (two) times daily with a meal.  Dispense: 180 capsule; Refill: 0 - gabapentin (NEURONTIN) 300 MG capsule; Take 1 capsule (300 mg total) by mouth 2 (two) times daily.  Dispense: 180 capsule; Refill: 0 - QUEtiapine (SEROQUEL) 25 MG tablet; Take 1 tablet (25 mg total) by mouth at bedtime.  Dispense: 90 tablet; Refill: 0 - venlafaxine XR (EFFEXOR-XR) 150 MG 24 hr capsule; Take 1 capsule (150 mg total) by mouth daily with breakfast.  Dispense: 90 capsule; Refill: 0  2. Other mixed anxiety disorders  - hydrOXYzine (ATARAX/VISTARIL) 10 MG tablet; Take 1 tablet (10 mg total) by mouth 3 (three) times daily as needed for anxiety.  Dispense: 270 tablet; Refill: 0   Continue same medication regimen. Follow up in 3 months.    [place] Crane, MD 11/07/2020, 1:55 PM

## 2020-11-08 ENCOUNTER — Ambulatory Visit (INDEPENDENT_AMBULATORY_CARE_PROVIDER_SITE_OTHER): Payer: Medicare PPO

## 2020-11-08 VITALS — Ht 59.0 in | Wt 145.0 lb

## 2020-11-08 DIAGNOSIS — Z0000 Encounter for general adult medical examination without abnormal findings: Secondary | ICD-10-CM

## 2020-11-08 NOTE — Patient Instructions (Signed)
[name] , Thank you for taking time to come for your Medicare Wellness Visit. I appreciate your ongoing commitment to your health goals. Please review the following plan we discussed and let me know if I can assist you in the future.   Screening recommendations/referrals: Colonoscopy: completed 11/10/2018 Mammogram: decline Bone Density: completed 07/05/2015 Recommended yearly ophthalmology/optometry visit for glaucoma screening and checkup Recommended yearly dental visit for hygiene and checkup  Vaccinations: Influenza vaccine: completed 07/18/2020, due 04/10/2021 Pneumococcal vaccine: completed 10/22/2017 Tdap vaccine: completed 08/04/2014, due 08/04/2024 Shingles vaccine: discussed   Covid-19: 08/24/2020, 12/25/2019, 12/04/2019  Advanced directives: Please bring a copy of your POA (Power of Attorney) and/or Living Will to your next appointment.   Conditions/risks identified: smoking  Next appointment: Follow up in one year for your annual wellness visit    Preventive Care 33 Years and Older, Female Preventive care refers to lifestyle choices and visits with your health care provider that can promote health and wellness. What does preventive care include?  A yearly physical exam. This is also called an annual well check.  Dental exams once or twice a year.  Routine eye exams. Ask your health care provider how often you should have your eyes checked.  Personal lifestyle choices, including:  Daily care of your teeth and gums.  Regular physical activity.  Eating a healthy diet.  Avoiding tobacco and drug use.  Limiting alcohol use.  Practicing safe sex.  Taking low-dose aspirin every day.  Taking vitamin and mineral supplements as recommended by your health care provider. What happens during an annual well check? The services and screenings done by your health care provider during your annual well check will depend on your age, overall health, lifestyle risk factors,  and family history of disease. Counseling  Your health care provider may ask you questions about your:  Alcohol use.  Tobacco use.  Drug use.  Emotional well-being.  Home and relationship well-being.  Sexual activity.  Eating habits.  History of falls.  Memory and ability to understand (cognition).  Work and work Statistician.  Reproductive health. Screening  You may have the following tests or measurements:  Height, weight, and BMI.  Blood pressure.  Lipid and cholesterol levels. These may be checked every 5 years, or more frequently if you are over 67 years old.  Skin check.  Lung cancer screening. You may have this screening every year starting at age 17 if you have a 30-pack-year history of smoking and currently smoke or have quit within the past 15 years.  Fecal occult blood test (FOBT) of the stool. You may have this test every year starting at age 15.  Flexible sigmoidoscopy or colonoscopy. You may have a sigmoidoscopy every 5 years or a colonoscopy every 10 years starting at age 48.  Hepatitis C blood test.  Hepatitis B blood test.  Sexually transmitted disease (STD) testing.  Diabetes screening. This is done by checking your blood sugar (glucose) after you have not eaten for a while (fasting). You may have this done every 1-3 years.  Bone density scan. This is done to screen for osteoporosis. You may have this done starting at age 36.  Mammogram. This may be done every 1-2 years. Talk to your health care provider about how often you should have regular mammograms. Talk with your health care provider about your test results, treatment options, and if necessary, the need for more tests. Vaccines  Your health care provider may recommend certain vaccines, such as:  Influenza vaccine.  This is recommended every year.  Tetanus, diphtheria, and acellular pertussis (Tdap, Td) vaccine. You may need a Td booster every 10 years.  Zoster vaccine. You may need  this after age 71.  Pneumococcal 13-valent conjugate (PCV13) vaccine. One dose is recommended after age 74.  Pneumococcal polysaccharide (PPSV23) vaccine. One dose is recommended after age 38. Talk to your health care provider about which screenings and vaccines you need and how often you need them. This information is not intended to replace advice given to you by your health care provider. Make sure you discuss any questions you have with your health care provider. Document Released: 09/23/2015 Document Revised: 05/16/2016 Document Reviewed: 06/28/2015 Elsevier Interactive Patient Education  2017 [place] Prevention in the Home Falls can cause injuries. They can happen to people of all ages. There are many things you can do to make your home safe and to help prevent falls. What can I do on the outside of my home?  Regularly fix the edges of walkways and driveways and fix any cracks.  Remove anything that might make you trip as you walk through a door, such as a raised step or threshold.  Trim any bushes or trees on the path to your home.  Use bright outdoor lighting.  Clear any walking paths of anything that might make someone trip, such as rocks or tools.  Regularly check to see if handrails are loose or broken. Make sure that both sides of any steps have handrails.  Any raised decks and porches should have guardrails on the edges.  Have any leaves, snow, or ice cleared regularly.  Use sand or salt on walking paths during winter.  Clean up any spills in your garage right away. This includes oil or grease spills. What can I do in the bathroom?  Use night lights.  Install grab bars by the toilet and in the tub and shower. Do not use towel bars as grab bars.  Use non-skid mats or decals in the tub or shower.  If you need to sit down in the shower, use a plastic, non-slip stool.  Keep the floor dry. Clean up any water that spills on the floor as soon as it  happens.  Remove soap buildup in the tub or shower regularly.  Attach bath mats securely with double-sided non-slip rug tape.  Do not have throw rugs and other things on the floor that can make you trip. What can I do in the bedroom?  Use night lights.  Make sure that you have a light by your bed that is easy to reach.  Do not use any sheets or blankets that are too big for your bed. They should not hang down onto the floor.  Have a firm chair that has side arms. You can use this for support while you get dressed.  Do not have throw rugs and other things on the floor that can make you trip. What can I do in the kitchen?  Clean up any spills right away.  Avoid walking on wet floors.  Keep items that you use a lot in easy-to-reach places.  If you need to reach something above you, use a strong step stool that has a grab bar.  Keep electrical cords out of the way.  Do not use floor polish or wax that makes floors slippery. If you must use wax, use non-skid floor wax.  Do not have throw rugs and other things on the floor that can make  you trip. What can I do with my stairs?  Do not leave any items on the stairs.  Make sure that there are handrails on both sides of the stairs and use them. Fix handrails that are broken or loose. Make sure that handrails are as long as the stairways.  Check any carpeting to make sure that it is firmly attached to the stairs. Fix any carpet that is loose or worn.  Avoid having throw rugs at the top or bottom of the stairs. If you do have throw rugs, attach them to the floor with carpet tape.  Make sure that you have a light switch at the top of the stairs and the bottom of the stairs. If you do not have them, ask someone to add them for you. What else can I do to help prevent falls?  Wear shoes that:  Do not have high heels.  Have rubber bottoms.  Are comfortable and fit you well.  Are closed at the toe. Do not wear sandals.  If you  use a stepladder:  Make sure that it is fully opened. Do not climb a closed stepladder.  Make sure that both sides of the stepladder are locked into place.  Ask someone to hold it for you, if possible.  Clearly mark and make sure that you can see:  Any grab bars or handrails.  First and last steps.  Where the edge of each step is.  Use tools that help you move around (mobility aids) if they are needed. These include:  Canes.  Walkers.  Scooters.  Crutches.  Turn on the lights when you go into a dark area. Replace any light bulbs as soon as they burn out.  Set up your furniture so you have a clear path. Avoid moving your furniture around.  If any of your floors are uneven, fix them.  If there are any pets around you, be aware of where they are.  Review your medicines with your doctor. Some medicines can make you feel dizzy. This can increase your chance of falling. Ask your doctor what other things that you can do to help prevent falls. This information is not intended to replace advice given to you by your health care provider. Make sure you discuss any questions you have with your health care provider. Document Released: 06/23/2009 Document Revised: 02/02/2016 Document Reviewed: 10/01/2014 Elsevier Interactive Patient Education  2017 [employer].

## 2020-11-08 NOTE — Progress Notes (Addendum)
I connected with Mackenzie Ward today by telephone and verified that I am speaking with the correct person using two identifiers. Location patient: home Location provider: work Persons participating in the virtual visit: Mackenzie Ward, [place] LPN.   I discussed the limitations, risks, security and privacy concerns of performing an evaluation and management service by telephone and the availability of in person appointments. I also discussed with the patient that there may be a patient responsible charge related to this service. The patient expressed understanding and verbally consented to this telephonic visit.    Interactive audio and video telecommunications were attempted between this provider and patient, however failed, due to patient having technical difficulties OR patient did not have access to video capability.  We continued and completed visit with audio only.     Vital signs may be patient reported or missing.  Subjective:   Mackenzie Ward is a 75 y.o. female who presents for Medicare Annual (Subsequent) preventive examination.  Review of Systems     Cardiac Risk Factors include: advanced age (>24men, >67 women);sedentary lifestyle;smoking/ tobacco exposure     Objective:    Today's Vitals   11/08/20 0938  Weight: 145 lb (65.8 kg)  Height: 4\' 11"  (1.499 m)   Body mass index is 29.29 kg/m.  Advanced Directives 11/08/2020 11/03/2019 09/30/2019 08/26/2019 11/10/2018 10/28/2018 10/22/2017  Does Patient Have a Medical Advance Directive? Yes Yes Yes Yes Yes Yes No  Type of Paramedic of [place];Living will Living will;Healthcare Power of [place];Living will [place];Living will [place];Living will Living will;Healthcare Power of Attorney -  Does patient want to make changes to medical advance directive? - - No - Patient declined No - Patient declined - - -  Copy of Mackenzie Ward in Chart? No - copy requested No - copy requested Yes - validated most recent copy scanned in chart (See row information) Yes - validated most recent copy scanned in chart (See row information) No - copy requested No - copy requested -  Would patient like information on creating a medical advance directive? - - - - - - Yes (MAU/Ambulatory/Procedural Areas - Information given)  Some encounter information is confidential and restricted. Go to Review Flowsheets activity to see all data.    Current Medications (verified) Outpatient Encounter Medications as of 11/08/2020  Medication Sig  . albuterol (PROVENTIL) (2.5 MG/3ML) 0.083% nebulizer solution Take 3 mLs (2.5 mg total) by nebulization every 6 (six) hours as needed for wheezing or shortness of breath.  . gabapentin (NEURONTIN) 300 MG capsule Take 1 capsule (300 mg total) by mouth 2 (two) times daily.  . hydrOXYzine (ATARAX/VISTARIL) 10 MG tablet Take 1 tablet (10 mg total) by mouth 3 (three) times daily as needed for anxiety.  Marland Kitchen lithium carbonate 150 MG capsule Take 1 capsule (150 mg total) by mouth 2 (two) times daily with a meal.  . QUEtiapine (SEROQUEL) 25 MG tablet Take 1 tablet (25 mg total) by mouth at bedtime.  Marland Kitchen venlafaxine XR (EFFEXOR-XR) 150 MG 24 hr capsule Take 1 capsule (150 mg total) by mouth daily with breakfast.  . Nutritional Supplements (VITAMIN D BOOSTER PO) Take by mouth. (Patient not taking: Reported on 11/08/2020)  . tiotropium (SPIRIVA HANDIHALER) 18 MCG inhalation capsule Place 1 capsule (18 mcg total) into inhaler and inhale daily. (Patient not taking: Reported on 11/08/2020)   No facility-administered encounter medications on file as of 11/08/2020.  Allergies (verified) Patient has no known allergies.   History: Past Medical History:  Diagnosis Date  . Asthma   . COPD (chronic obstructive pulmonary disease) ([place])   . Depression   . High cholesterol    Past Surgical History:  Procedure Laterality  Date  . BREAST SURGERY Right 2013  . CATARACT EXTRACTION W/PHACO Left 08/26/2019   Procedure: CATARACT EXTRACTION PHACO AND INTRAOCULAR LENS PLACEMENT (IOC) LEFT  4.45    00:40.5    11.0%;  Surgeon: Leandrew Koyanagi, MD;  Location: [facility];  Service: Ophthalmology;  Laterality: Left;  . CATARACT EXTRACTION W/PHACO Right 09/30/2019   Procedure: CATARACT EXTRACTION PHACO AND INTRAOCULAR LENS PLACEMENT (IOC) RIGHT 4.05  00:44.4  9.1% ;  Surgeon: Leandrew Koyanagi, MD;  Location: [facility];  Service: Ophthalmology;  Laterality: Right;  . COLONOSCOPY  15 years ago  . COLONOSCOPY WITH PROPOFOL N/A 11/10/2018   Procedure: COLONOSCOPY WITH PROPOFOL;  Surgeon: Lin Landsman, MD;  Location[facility] ENDOSCOPY;  Service: Gastroenterology;  Laterality: N/A;  . EYE SURGERY    . TONSILLECTOMY     Family History  Problem Relation Age of Onset  . Lymphoma Mother        Non Hodgkin  . Dementia Mother   . Alzheimer's disease Mother   . Lymphoma Sister        Non Hodgkin  . Depression Sister   . Heart attack Father   . Alcohol abuse Father   . Dementia Brother   . Dementia Maternal Uncle   . Alzheimer's disease Maternal Aunt   . Alzheimer's disease Paternal Aunt   . Alzheimer's disease Paternal Grandmother   . Alzheimer's disease Maternal Aunt   . Alzheimer's disease Paternal Aunt    Social History   Socioeconomic History  . Marital status: Married    Spouse name: Not on file  . Number of children: Not on file  . Years of education: Not on file  . Highest education level: Some college, no degree  Occupational History  . Occupation: retired  Tobacco Use  . Smoking status: Current Every Day Smoker    Packs/day: 1.00    Years: 45.00    Pack years: 45.00    Types: Cigarettes  . Smokeless tobacco: Never Used  Vaping Use  . Vaping Use: Never used  Substance and Sexual Activity  . Alcohol use: No    Alcohol/week: 0.0 standard drinks  . Drug use: No  . Sexual  activity: Not Currently  Other Topics Concern  . Not on file  Social History Narrative   Goes to dinner with friends often, goes to see her mom at nursing home on a regular basis    Social Determinants of Health   Financial Resource Strain: Low Risk   . Difficulty of Paying Living Expenses: Not hard at all  Food Insecurity: No Food Insecurity  . Worried About Charity fundraiser in the Last Year: Never true  . Ran Out of Food in the Last Year: Never true  Transportation Needs: No Transportation Needs  . Lack of Transportation (Medical): No  . Lack of Transportation (Non-Medical): No  Physical Activity: Inactive  . Days of Exercise per Week: 0 days  . Minutes of Exercise per Session: 0 min  Stress: No Stress Concern Present  . Feeling of Stress : Not at all  Social Connections: Not on file    Tobacco Counseling Ready to quit: No Counseling given: Not Answered   Clinical Intake:  Pre-visit preparation  completed: Yes  Pain : No/denies pain     Nutritional Status: BMI 25 -29 Overweight Nutritional Risks: None Diabetes: No  How often do you need to have someone help you when you read instructions, pamphlets, or other written materials from your doctor or pharmacy?: 1 - Never What is the last grade level you completed in school?: some college  Diabetic? no  Interpreter Needed?: No  Information entered by :: NAllen LPN   Activities of Daily Living In your present state of health, do you have any difficulty performing the following activities: 11/08/2020  Hearing? N  Vision? Y  Comment some blurriness  Difficulty concentrating or making decisions? Y  Walking or climbing stairs? Y  Dressing or bathing? N  Doing errands, shopping? N  Preparing Food and eating ? N  Using the Toilet? N  In the past six months, have you accidently leaked urine? N  Do you have problems with loss of bowel control? N  Managing your Medications? N  Managing your Finances? N   Housekeeping or managing your Housekeeping? N  Some recent data might be hidden    Patient Care Team: Olin Hauser, DO as PCP - General (Family Medicine) Rainey Pines, MD as Consulting Physician (Psychiatry)  Indicate any recent Medical Services you may have received from other than [facility] providers in the past year (date may be approximate).     Assessment:   This is a routine wellness examination for [place].  Hearing/Vision screen  Hearing Screening   125Hz  250Hz  500Hz  1000Hz  2000Hz  3000Hz  4000Hz  6000Hz  8000Hz   Right ear:           Left ear:           Vision Screening Comments: Regular eye exams, [facility]  Dietary issues and exercise activities discussed: Current Exercise Habits: The patient does not participate in regular exercise at present  Goals    . DIET - INCREASE WATER INTAKE     Recommend drinking at least 6-8 glasses of water a day.     . Patient Stated     11/08/2020, no goals    . Quit Smoking     Smoking cessation discussed      Depression Screen PHQ 2/9 Scores 11/08/2020 07/19/2020 01/07/2020 11/03/2019 10/28/2018 07/31/2018 10/22/2017  PHQ - 2 Score 3 2 3 1  0 0 3  PHQ- 9 Score 10 8 12  - - 0 6  Some encounter information is confidential and restricted. Go to Review Flowsheets activity to see all data.    Fall Risk Fall Risk  11/08/2020 07/18/2020 11/03/2019 10/28/2018 07/31/2018  Falls in the past year? 0 0 0 0 0  Number falls in past yr: - 0 0 0 -  Injury with Fall? - 0 0 0 -  Comment - - - - -  Risk for fall due to : Medication side effect;Impaired balance/gait - - - -  Follow up Falls evaluation completed;Education provided;Falls prevention discussed Falls evaluation completed - - Falls evaluation completed    FALL RISK PREVENTION PERTAINING TO THE HOME:  Any stairs in or around the home? No  If so, are there any without handrails? n/a Home free of loose throw rugs in walkways, pet beds, electrical cords, etc? Yes  Adequate lighting  in your home to reduce risk of falls? Yes   ASSISTIVE DEVICES UTILIZED TO PREVENT FALLS:  Life alert? No  Use of a cane, walker or w/c? Yes  Grab bars in the bathroom? Yes  Shower  chair or bench in shower? No  Elevated toilet seat or a handicapped toilet? No   TIMED UP AND GO:  Was the test performed? No .    Cognitive Function:     6CIT Screen 11/08/2020 10/28/2018 10/22/2017  What Year? 0 points 0 points 0 points  What month? 0 points 0 points 0 points  What time? 0 points 0 points 0 points  Count back from 20 0 points 0 points 0 points  Months in reverse 0 points 0 points 0 points  Repeat phrase 0 points 0 points 0 points  Total Score 0 0 0    Immunizations Immunization History  Administered Date(s) Administered  . Fluad Quad(high Dose 65+) 07/18/2020  . Influenza, High Dose Seasonal PF 06/30/2014, 06/20/2015, 06/21/2017, 07/31/2018  . Influenza,inj,Quad PF,6+ Mos 06/30/2013  . Influenza-Unspecified 06/30/2014  . PFIZER(Purple Top)SARS-COV-2 Vaccination 12/04/2019, 12/25/2019, 08/24/2020  . Pneumococcal Conjugate-13 06/30/2014  . Pneumococcal Polysaccharide-23 10/22/2017  . Tdap 08/04/2014    TDAP status: Up to date  Flu Vaccine status: Up to date  Pneumococcal vaccine status: Up to date  Covid-19 vaccine status: Completed vaccines  Qualifies for Shingles Vaccine? Yes   Zostavax completed Yes   Shingrix Completed?: No.    Education has been provided regarding the importance of this vaccine. Patient has been advised to call insurance company to determine out of pocket expense if they have not yet received this vaccine. Advised may also receive vaccine at local pharmacy or Health Dept. Verbalized acceptance and understanding.  Screening Tests Health Maintenance  Topic Date Due  . MAMMOGRAM  11/08/2021 (Originally 06/19/2019)  . COVID-19 Vaccine (4 - Booster for Pfizer series) 02/22/2021  . COLONOSCOPY (Pts 45-73yrs Insurance coverage will need to be confirmed)   11/09/2021  . TETANUS/TDAP  08/04/2024  . INFLUENZA VACCINE  Completed  . DEXA SCAN  Completed  . Hepatitis C Screening  Completed  . PNA vac Low Risk Adult  Completed  . HPV VACCINES  Aged Out    Health Maintenance  There are no preventive care reminders to display for this patient.  Colorectal cancer screening: Type of screening: Colonoscopy. Completed 11/10/2018. Repeat every 3 years  Mammogram status: decline  Bone Density status: Completed 07/05/2015.   Lung Cancer Screening: (Low Dose CT Chest recommended if Age 39-80 years, 30 pack-year currently smoking OR have quit w/in 15years.) does qualify.   Lung Cancer Screening Referral: completed 11/16/2019  Additional Screening:  Hepatitis C Screening: does qualify; Completed 07/05/2015  Vision Screening: Recommended annual ophthalmology exams for early detection of glaucoma and other disorders of the eye. Is the patient up to date with their annual eye exam?  Yes  Who is the provider or what is the name of the office in which the patient attends annual eye exams? [facility] If pt is not established with a provider, would they like to be referred to a provider to establish care? No .   Dental Screening: Recommended annual dental exams for proper oral hygiene  Community Resource Referral / Chronic Care Management: CRR required this visit?  No   CCM required this visit?  No      Plan:     I have personally reviewed and noted the following in the patient's chart:   . Medical and social history . Use of alcohol, tobacco or illicit drugs  . Current medications and supplements . Functional ability and status . Nutritional status . Physical activity . Advanced directives . List of other physicians .  Hospitalizations, surgeries, and ER visits in previous 12 months . Vitals . Screenings to include cognitive, depression, and falls . Referrals and appointments  In addition, I have reviewed and discussed with  patient certain preventive protocols, quality metrics, and best practice recommendations. A written personalized care plan for preventive services as well as general preventive health recommendations were provided to patient.     Kellie Simmering, LPN   09/12/8869   Nurse Notes:

## 2020-11-09 ENCOUNTER — Telehealth: Payer: Self-pay | Admitting: *Deleted

## 2020-11-09 DIAGNOSIS — Z122 Encounter for screening for malignant neoplasm of respiratory organs: Secondary | ICD-10-CM

## 2020-11-09 DIAGNOSIS — F17200 Nicotine dependence, unspecified, uncomplicated: Secondary | ICD-10-CM

## 2020-11-09 DIAGNOSIS — Z87891 Personal history of nicotine dependence: Secondary | ICD-10-CM

## 2020-11-09 NOTE — Telephone Encounter (Signed)
Patient scheduled for scan 11/24/20 12:30, no change in insurance reports smoking a pack a day.

## 2020-11-10 NOTE — Telephone Encounter (Signed)
Patient is a current smoker, 48 pack year history.

## 2020-11-24 ENCOUNTER — Ambulatory Visit
Admission: RE | Admit: 2020-11-24 | Discharge: 2020-11-24 | Disposition: A | Discharge status: Home / Self Care | Payer: Medicare PPO | Source: Ambulatory Visit | Attending: Nurse Practitioner | Admitting: Nurse Practitioner

## 2020-11-24 ENCOUNTER — Other Ambulatory Visit: Payer: Self-pay

## 2020-11-24 DIAGNOSIS — Z87891 Personal history of nicotine dependence: Secondary | ICD-10-CM | POA: Insufficient documentation

## 2020-11-24 DIAGNOSIS — F17200 Nicotine dependence, unspecified, uncomplicated: Secondary | ICD-10-CM | POA: Insufficient documentation

## 2020-11-24 DIAGNOSIS — F17210 Nicotine dependence, cigarettes, uncomplicated: Secondary | ICD-10-CM | POA: Diagnosis not present

## 2020-11-24 DIAGNOSIS — Z122 Encounter for screening for malignant neoplasm of respiratory organs: Secondary | ICD-10-CM | POA: Diagnosis not present

## 2020-12-02 ENCOUNTER — Encounter: Payer: Self-pay | Admitting: *Deleted

## 2020-12-27 ENCOUNTER — Other Ambulatory Visit: Payer: Self-pay | Admitting: Family Medicine

## 2020-12-27 DIAGNOSIS — J432 Centrilobular emphysema: Secondary | ICD-10-CM

## 2020-12-27 DIAGNOSIS — R0609 Other forms of dyspnea: Secondary | ICD-10-CM

## 2020-12-27 DIAGNOSIS — R0600 Dyspnea, unspecified: Secondary | ICD-10-CM

## 2021-01-09 ENCOUNTER — Other Ambulatory Visit: Payer: Self-pay

## 2021-01-09 ENCOUNTER — Other Ambulatory Visit: Payer: Medicare PPO

## 2021-01-09 DIAGNOSIS — R7309 Other abnormal glucose: Secondary | ICD-10-CM | POA: Diagnosis not present

## 2021-01-09 DIAGNOSIS — F3170 Bipolar disorder, currently in remission, most recent episode unspecified: Secondary | ICD-10-CM | POA: Diagnosis not present

## 2021-01-09 DIAGNOSIS — E782 Mixed hyperlipidemia: Secondary | ICD-10-CM | POA: Diagnosis not present

## 2021-01-09 DIAGNOSIS — Z0000 Encounter for general adult medical examination without abnormal findings: Secondary | ICD-10-CM

## 2021-01-09 DIAGNOSIS — E559 Vitamin D deficiency, unspecified: Secondary | ICD-10-CM | POA: Diagnosis not present

## 2021-01-09 DIAGNOSIS — Z79899 Other long term (current) drug therapy: Secondary | ICD-10-CM

## 2021-01-09 DIAGNOSIS — J432 Centrilobular emphysema: Secondary | ICD-10-CM | POA: Diagnosis not present

## 2021-01-10 LAB — CBC WITH DIFFERENTIAL/PLATELET
Absolute Monocytes: 850 cells/uL (ref 200–950)
Basophils Absolute: 55 cells/uL (ref 0–200)
Basophils Relative: 0.5 %
Eosinophils Absolute: 174 cells/uL (ref 15–500)
Eosinophils Relative: 1.6 %
HCT: 46.8 % — ABNORMAL HIGH (ref 35.0–45.0)
Hemoglobin: 15.2 g/dL (ref 11.7–15.5)
Lymphs Abs: 3194 cells/uL (ref 850–3900)
MCH: 28.6 pg (ref 27.0–33.0)
MCHC: 32.5 g/dL (ref 32.0–36.0)
MCV: 88.1 fL (ref 80.0–100.0)
MPV: 10.1 fL (ref 7.5–12.5)
Monocytes Relative: 7.8 %
Neutro Abs: 6627 cells/uL (ref 1500–7800)
Neutrophils Relative %: 60.8 %
Platelets: 402 10*3/uL — ABNORMAL HIGH (ref 140–400)
RBC: 5.31 10*6/uL — ABNORMAL HIGH (ref 3.80–5.10)
RDW: 14.3 % (ref 11.0–15.0)
Total Lymphocyte: 29.3 %
WBC: 10.9 10*3/uL — ABNORMAL HIGH (ref 3.8–10.8)

## 2021-01-10 LAB — COMPLETE METABOLIC PANEL WITH GFR
AG Ratio: 1.3 (calc) (ref 1.0–2.5)
ALT: 13 U/L (ref 6–29)
AST: 16 U/L (ref 10–35)
Albumin: 3.6 g/dL (ref 3.6–5.1)
Alkaline phosphatase (APISO): 127 U/L (ref 37–153)
BUN: 9 mg/dL (ref 7–25)
CO2: 28 mmol/L (ref 20–32)
Calcium: 9.1 mg/dL (ref 8.6–10.4)
Chloride: 99 mmol/L (ref 98–110)
Creat: 0.71 mg/dL (ref 0.60–0.93)
GFR, Est African American: 98 mL/min/{1.73_m2} (ref >=60)
GFR, Est Non African American: 84 mL/min/{1.73_m2} (ref >=60)
Globulin: 2.8 g/dL (calc) (ref 1.9–3.7)
Glucose, Bld: 75 mg/dL (ref 65–99)
Potassium: 4.8 mmol/L (ref 3.5–5.3)
Sodium: 135 mmol/L (ref 135–146)
Total Bilirubin: 0.4 mg/dL (ref 0.2–1.2)
Total Protein: 6.4 g/dL (ref 6.1–8.1)

## 2021-01-10 LAB — LIPID PANEL
Cholesterol: 242 mg/dL — ABNORMAL HIGH (ref <200)
HDL: 50 mg/dL (ref >=50)
LDL Cholesterol (Calc): 159 mg/dL (calc) — ABNORMAL HIGH
Non-HDL Cholesterol (Calc): 192 mg/dL (calc) — ABNORMAL HIGH (ref <130)
Total CHOL/HDL Ratio: 4.8 (calc) (ref <5.0)
Triglycerides: 177 mg/dL — ABNORMAL HIGH (ref <150)

## 2021-01-10 LAB — TSH: TSH: 3.98 mIU/L (ref 0.40–4.50)

## 2021-01-10 LAB — HEMOGLOBIN A1C
Hgb A1c MFr Bld: 5.3 % of total Hgb (ref <5.7)
Mean Plasma Glucose: 105 mg/dL
eAG (mmol/L): 5.8 mmol/L

## 2021-01-10 LAB — VITAMIN D 25 HYDROXY (VIT D DEFICIENCY, FRACTURES): Vit D, 25-Hydroxy: 26 ng/mL — ABNORMAL LOW (ref 30–100)

## 2021-01-16 ENCOUNTER — Encounter: Payer: Self-pay | Admitting: Family Medicine

## 2021-01-16 ENCOUNTER — Ambulatory Visit (INDEPENDENT_AMBULATORY_CARE_PROVIDER_SITE_OTHER): Payer: Medicare PPO | Admitting: Family Medicine

## 2021-01-16 ENCOUNTER — Other Ambulatory Visit: Payer: Self-pay

## 2021-01-16 VITALS — BP 118/55 | HR 74 | Ht <= 58 in | Wt 146.2 lb

## 2021-01-16 DIAGNOSIS — E782 Mixed hyperlipidemia: Secondary | ICD-10-CM

## 2021-01-16 DIAGNOSIS — M5441 Lumbago with sciatica, right side: Secondary | ICD-10-CM | POA: Diagnosis not present

## 2021-01-16 DIAGNOSIS — Z0000 Encounter for general adult medical examination without abnormal findings: Secondary | ICD-10-CM

## 2021-01-16 DIAGNOSIS — J432 Centrilobular emphysema: Secondary | ICD-10-CM

## 2021-01-16 DIAGNOSIS — F3170 Bipolar disorder, currently in remission, most recent episode unspecified: Secondary | ICD-10-CM | POA: Diagnosis not present

## 2021-01-16 DIAGNOSIS — G8929 Other chronic pain: Secondary | ICD-10-CM

## 2021-01-16 NOTE — Progress Notes (Signed)
Subjective:    Patient ID: Mackenzie Ward, female    DOB: 11/01/1945, 75 y.o.   MRN: [id number]  CARRA Ward is a 75 y.o. female presenting on 01/16/2021 for Annual Exam   HPI   Here for Annual Physical and Lab Review.   FOLLOW-UPCOPD(Mild Emphysema) Previous visit for COPD follow-up, continued on Spiriva daily and had LDCT for Lung CA Screen, see prior notes for background information.  - Today patient reportsdoing well.No recent flare. - She can tolerate regular activity without dyspnea. -Continues on Spiriva once daily most days, but skips some days when doesn't feel she needs it Admits weather change and allergy impact her breathing. - Rarely using Albuterol nebulizer once every 6 monthsexcept flare up -She has never seen Pulmonology or had PFT, and she is not interested at this time. Denies any worsening dyspnea, chest pain or tightness, productive cough, wheezing  Chronic Low Back Pain,OA/DJD/ Sciatica R sided Chronic problem Associated symptoms RLS and RLE extremity pain and radiating nerve pain Affecting her gait at times. Gait - Taking Gabapentin 300mg  nightly for RLS helping her symptoms - bottom of feet, cold burn prickle occasionally She has temporary handicap placard, it will expire in 24-Oct-2020, she will request a new one for longer term after that time.  HYPERLIPIDEMIA: - Reports no concerns. Last lipid panel 01/2021, elevated LDL She has been on Statin in past. She declines today  BPPV Vertigo Controlled no flare ups  Bipolar Disorder in remission  Followed by [facility], Dr Toy Care  Update PHQ GAD Updates her mother passed away in 24-Oct-2020. She was visiting her in the nursing home with her sister, support system and husband. On med management Venlafaxine 75mg  daily, Quetiapine 25mg  nightly, Lithium 150mg  BID, Hydroxyzine PRN  Health Maintenance: Updated today  Depression screen [facility] 2/9 01/16/2021 11/08/2020 07/19/2020  Decreased  Interest 1 3 0  Down, Depressed, Hopeless 1 0 2  PHQ - 2 Score 2 3 2   Altered sleeping 1 0 0  Tired, decreased energy 3 3 3   Change in appetite 3 1 1   Feeling bad or failure about yourself  1 3 0  Trouble concentrating 1 0 1  Moving slowly or fidgety/restless 0 0 1  Suicidal thoughts 0 0 0  PHQ-9 Score 11 10 8   Difficult doing work/chores Not difficult at all Somewhat difficult Somewhat difficult  Some encounter information is confidential and restricted. Go to Review Flowsheets activity to see all data.  Some recent data might be hidden   GAD 7 : Generalized Anxiety Score 01/07/2020  Nervous, Anxious, on Edge 0  Control/stop worrying 0  Worry too much - different things 0  Trouble relaxing 3  Restless 0  Easily annoyed or irritable 0  Afraid - awful might happen 0  Total GAD 7 Score 3  Anxiety Difficulty Not difficult at all      Past Medical History:  Diagnosis Date  . Asthma   . COPD (chronic obstructive pulmonary disease) ([place])   . Depression   . High cholesterol    Past Surgical History:  Procedure Laterality Date  . BREAST SURGERY Right 2013  . CATARACT EXTRACTION W/PHACO Left 08/26/2019   Procedure: CATARACT EXTRACTION PHACO AND INTRAOCULAR LENS PLACEMENT (IOC) LEFT  4.45    00:40.5    11.0%;  Surgeon: Leandrew Koyanagi, MD;  Location: [place];  Service: Ophthalmology;  Laterality: Left;  . CATARACT EXTRACTION W/PHACO Right 09/30/2019   Procedure: CATARACT EXTRACTION PHACO AND INTRAOCULAR  LENS PLACEMENT (IOC) RIGHT 4.05  00:44.4  9.1% ;  Surgeon: Leandrew Koyanagi, MD;  Location: [place] [facility];  Service: Ophthalmology;  Laterality: Right;  . COLONOSCOPY  15 years ago  . COLONOSCOPY WITH PROPOFOL N/A 11/10/2018   Procedure: COLONOSCOPY WITH PROPOFOL;  Surgeon: Lin Landsman, MD;  Location[facility] ENDOSCOPY;  Service: Gastroenterology;  Laterality: N/A;  . EYE SURGERY    . TONSILLECTOMY     Social History   Socioeconomic History  .  Marital status: Married    Spouse name: Not on file  . Number of children: Not on file  . Years of education: Not on file  . Highest education level: Some college, no degree  Occupational History  . Occupation: retired  Tobacco Use  . Smoking status: Current Every Day Smoker    Packs/day: 1.00    Years: 45.00    Pack years: 45.00    Types: Cigarettes  . Smokeless tobacco: Never Used  Vaping Use  . Vaping Use: Never used  Substance and Sexual Activity  . Alcohol use: No    Alcohol/week: 0.0 standard drinks  . Drug use: No  . Sexual activity: Not Currently  Other Topics Concern  . Not on file  Social History Narrative   Goes to dinner with friends often, goes to see her mom at nursing home on a regular basis    Social Determinants of Health   Financial Resource Strain: Low Risk   . Difficulty of Paying Living Expenses: Not hard at all  Food Insecurity: No Food Insecurity  . Worried About Charity fundraiser in the Last Year: Never true  . Ran Out of Food in the Last Year: Never true  Transportation Needs: No Transportation Needs  . Lack of Transportation (Medical): No  . Lack of Transportation (Non-Medical): No  Physical Activity: Inactive  . Days of Exercise per Week: 0 days  . Minutes of Exercise per Session: 0 min  Stress: No Stress Concern Present  . Feeling of Stress : Not at all  Social Connections: Not on file  Intimate Partner Violence: Not on file   Family History  Problem Relation Age of Onset  . Lymphoma Mother        Non Hodgkin  . Dementia Mother   . Alzheimer's disease Mother   . Lymphoma Sister        Non Hodgkin  . Depression Sister   . Heart attack Father   . Alcohol abuse Father   . Dementia Brother   . Dementia Maternal Uncle   . Alzheimer's disease Maternal Aunt   . Alzheimer's disease Paternal Aunt   . Alzheimer's disease Paternal Grandmother   . Alzheimer's disease Maternal Aunt   . Alzheimer's disease Paternal Aunt    Current  Outpatient Medications on File Prior to Visit  Medication Sig  . albuterol (PROVENTIL) (2.5 MG/3ML) 0.083% nebulizer solution Take 3 mLs (2.5 mg total) by nebulization every 6 (six) hours as needed for wheezing or shortness of breath.  . gabapentin (NEURONTIN) 300 MG capsule Take 1 capsule (300 mg total) by mouth 2 (two) times daily.  . hydrOXYzine (ATARAX/VISTARIL) 10 MG tablet Take 1 tablet (10 mg total) by mouth 3 (three) times daily as needed for anxiety.  Marland Kitchen lithium carbonate 150 MG capsule Take 1 capsule (150 mg total) by mouth 2 (two) times daily with a meal.  . QUEtiapine (SEROQUEL) 25 MG tablet Take 1 tablet (25 mg total) by mouth at bedtime.  Marland Kitchen SPIRIVA  HANDIHALER 18 MCG inhalation capsule Place 1 capsule (18 mcg total) into inhaler and inhale daily.  Marland Kitchen venlafaxine XR (EFFEXOR-XR) 150 MG 24 hr capsule Take 1 capsule (150 mg total) by mouth daily with breakfast.  . Nutritional Supplements (VITAMIN D BOOSTER PO) Take by mouth. (Patient not taking: No sig reported)   No current facility-administered medications on file prior to visit.    Review of Systems  Constitutional: Negative for activity change, appetite change, chills, diaphoresis, fatigue and fever.  HENT: Negative for congestion and hearing loss.   Eyes: Negative for visual disturbance.  Respiratory: Negative for cough, chest tightness, shortness of breath and wheezing.   Cardiovascular: Negative for chest pain, palpitations and leg swelling.  Gastrointestinal: Negative for abdominal pain, constipation, diarrhea, nausea and vomiting.  Genitourinary: Negative for dysuria, frequency and hematuria.  Musculoskeletal: Negative for arthralgias and neck pain.  Skin: Negative for rash.  Neurological: Negative for dizziness, weakness, light-headedness, numbness and headaches.  Hematological: Negative for adenopathy.  Psychiatric/Behavioral: Negative for behavioral problems, dysphoric mood and sleep disturbance.   Per HPI unless  specifically indicated above     Objective:    BP (!) 118/55   Pulse 74   Ht 4\' 10"  (1.473 m)   Wt 146 lb 3.2 oz (66.3 kg)   SpO2 99%   BMI 30.56 kg/m   Wt Readings from Last 3 Encounters:  01/16/21 146 lb 3.2 oz (66.3 kg)  11/24/20 146 lb (66.2 kg)  11/08/20 145 lb (65.8 kg)    Physical Exam Vitals and nursing note reviewed.  Constitutional:      General: She is not in acute distress.    Appearance: She is well-developed. She is not diaphoretic.     Comments: Well-appearing, comfortable, cooperative  HENT:     Head: Normocephalic and atraumatic.  Eyes:     General:        Right eye: No discharge.        Left eye: No discharge.     Conjunctiva/sclera: Conjunctivae normal.     Pupils: Pupils are equal, round, and reactive to light.  Neck:     Thyroid: No thyromegaly.  Cardiovascular:     Rate and Rhythm: Normal rate and regular rhythm.     Heart sounds: Normal heart sounds. No murmur heard.   Pulmonary:     Effort: Pulmonary effort is normal. No respiratory distress.     Breath sounds: Normal breath sounds. No wheezing or rales.  Abdominal:     General: Bowel sounds are normal. There is no distension.     Palpations: Abdomen is soft. There is no mass.     Tenderness: There is no abdominal tenderness.  Musculoskeletal:        General: No tenderness. Normal range of motion.     Cervical back: Normal range of motion and neck supple.     Comments: Upper / Lower Extremities: - Normal muscle tone, strength bilateral upper extremities 5/5, lower extremities 5/5  Lymphadenopathy:     Cervical: No cervical adenopathy.  Skin:    General: Skin is warm and dry.     Findings: No erythema or rash.  Neurological:     Mental Status: She is alert and oriented to person, place, and time.     Comments: Distal sensation intact to light touch all extremities  Psychiatric:        Behavior: Behavior normal.     Comments: Well groomed, good eye contact, normal speech and thoughts  Results for orders placed or performed in visit on 01/09/21  TSH  Result Value Ref Range   TSH 3.98 0.40 - 4.50 mIU/L  VITAMIN D 25 Hydroxy (Vit-D Deficiency, Fractures)  Result Value Ref Range   Vit D, 25-Hydroxy 26 (L) 30 - 100 ng/mL  Lipid panel  Result Value Ref Range   Cholesterol 242 (H) <200 mg/dL   HDL 50 > OR = 50 mg/dL   Triglycerides 177 (H) <150 mg/dL   LDL Cholesterol (Calc) 159 (H) mg/dL (calc)   Total CHOL/HDL Ratio 4.8 <5.0 (calc)   Non-HDL Cholesterol (Calc) 192 (H) <130 mg/dL (calc)  COMPLETE METABOLIC PANEL WITH GFR  Result Value Ref Range   Glucose, Bld 75 65 - 99 mg/dL   BUN 9 7 - 25 mg/dL   Creat 0.71 0.60 - 0.93 mg/dL   GFR, Est Non African American 84 > OR = 60 mL/min/1.85m2   GFR, Est African American 98 > OR = 60 mL/min/1.30m2   BUN/Creatinine Ratio NOT APPLICABLE 6 - 22 (calc)   Sodium 135 135 - 146 mmol/L   Potassium 4.8 3.5 - 5.3 mmol/L   Chloride 99 98 - 110 mmol/L   CO2 28 20 - 32 mmol/L   Calcium 9.1 8.6 - 10.4 mg/dL   Total Protein 6.4 6.1 - 8.1 g/dL   Albumin 3.6 3.6 - 5.1 g/dL   Globulin 2.8 1.9 - 3.7 g/dL (calc)   AG Ratio 1.3 1.0 - 2.5 (calc)   Total Bilirubin 0.4 0.2 - 1.2 mg/dL   Alkaline phosphatase (APISO) 127 37 - 153 U/L   AST 16 10 - 35 U/L   ALT 13 6 - 29 U/L  CBC with Differential/Platelet  Result Value Ref Range   WBC 10.9 (H) 3.8 - 10.8 Thousand/uL   RBC 5.31 (H) 3.80 - 5.10 Million/uL   Hemoglobin 15.2 11.7 - 15.5 g/dL   HCT 46.8 (H) 35.0 - 45.0 %   MCV 88.1 80.0 - 100.0 fL   MCH 28.6 27.0 - 33.0 pg   MCHC 32.5 32.0 - 36.0 g/dL   RDW 14.3 11.0 - 15.0 %   Platelets 402 (H) 140 - 400 Thousand/uL   MPV 10.1 7.5 - 12.5 fL   Neutro Abs 6,627 1,500 - 7,800 cells/uL   Lymphs Abs 3,194 850 - 3,900 cells/uL   Absolute Monocytes 850 200 - 950 cells/uL   Eosinophils Absolute 174 15 - 500 cells/uL   Basophils Absolute 55 0 - 200 cells/uL   Neutrophils Relative % 60.8 %   Total Lymphocyte 29.3 %   Monocytes Relative  7.8 %   Eosinophils Relative 1.6 %   Basophils Relative 0.5 %  Hemoglobin A1c  Result Value Ref Range   Hgb A1c MFr Bld 5.3 <5.7 % of total Hgb   Mean Plasma Glucose 105 mg/dL   eAG (mmol/L) 5.8 mmol/L   I have personally reviewed the radiology report from 11/24/20 LDCT.  CLINICAL DATA:  75 year old asymptomatic female current smoker with 48 pack-year smoking history.  EXAM: CT CHEST WITHOUT CONTRAST LOW-DOSE FOR LUNG CANCER SCREENING  TECHNIQUE: Multidetector CT imaging of the chest was performed following the standard protocol without IV contrast.  COMPARISON:  11/16/2019 screening chest CT.  FINDINGS: Cardiovascular: Normal heart size. No significant pericardial effusion/thickening. Three-vessel coronary atherosclerosis. Atherosclerotic nonaneurysmal thoracic aorta. Normal caliber pulmonary arteries.  Mediastinum/Nodes: No discrete thyroid nodules. Unremarkable esophagus. No pathologically enlarged axillary, mediastinal or hilar lymph nodes, noting limited sensitivity for the detection of hilar adenopathy on  this noncontrast study.  Lungs/Pleura: No pneumothorax. No pleural effusion. Mild centrilobular emphysema with mild diffuse bronchial wall thickening. No acute consolidative airspace disease or lung masses. No significant growth of previously visualized scattered pulmonary nodules. No new significant pulmonary nodules.  Upper abdomen: Small hiatal hernia.  Musculoskeletal: No aggressive appearing focal osseous lesions. Moderate thoracic spondylosis.  IMPRESSION: 1. Lung-RADS 2, benign appearance or behavior. Continue annual screening with low-dose chest CT without contrast in 12 months. 2. Three-vessel coronary atherosclerosis. 3. Small hiatal hernia. 4. Aortic Atherosclerosis (ICD10-I70.0) and Emphysema (ICD10-J43.9).   Electronically Signed   By: Ilona Sorrel M.D.   On: 11/24/2020 21:06     Assessment & Plan:   Problem List Items  Addressed This Visit    Mixed hyperlipidemia    Uncontrolled cholesterol, improved from last time but still LDL >159 No longer on statin Last lipid panel 10/2017 Elevated ASCVD risk score  Plan: 1. Counseling on reducing ASCVD risk - may consider low dose other statin rosuvastatin intermittent titrate up dosing, and or start ASA 81mg  for primary ASCVD risk reduction - she declines intervention today 3. Encourage improved lifestyle - low carb/cholesterol, reduce portion size, start regular exercise 4. Follow-up lipids yearly      Chronic right-sided low back pain with right-sided sciatica    Chronic problem Will need handicap placard 5 yr today, form comlpeted      Centrilobular emphysema ([place])    Asymptomatic. Stable, remains improved COPD, mild emphysema Improved functional respiratory status on anti cholinergic No formal PFT or Pulmonology - patient declines Infrequent flares Last LDCT image 11/2020  Plan Continue Spiriva 14mcg daily inhalation Smoking cessation - declined      Bipolar disorder in remission ([place])    Stable chronic problem in remission Followed by [facility] - Continues on current medications now without change - Seroquel 25mg  bedtime, Effexor 75mg  AM, Lithium 150mg  BID  Advised patient that I would recommend that she continue with med management from her Psychiatry while on Lithium therapy. I am okay to collaborate with Psychiatry in future if it is truly needed to see them less often.       Other Visit Diagnoses    Annual physical exam    -  Primary      Updated Health Maintenance information Reviewed recent lab results with patient Encouraged improvement to lifestyle with diet and exercise Goal of weight loss   No orders of the defined types were placed in this encounter.     Follow up plan: Return in about 6 months (around 07/19/2021) for 6 month follow-up HTN.  Nobie Putnam, [place]  [facility] 01/16/2021, 10:10 AM

## 2021-01-16 NOTE — Assessment & Plan Note (Signed)
Stable chronic problem in remission Followed by [facility] - Continues on current medications now without change - Seroquel 25mg bedtime, Effexor 75mg AM, Lithium 150mg BID  Advised patient that I would recommend that she continue with med management from her Psychiatry while on Lithium therapy. I am okay to collaborate with Psychiatry in future if it is truly needed to see them less often. 

## 2021-01-16 NOTE — Assessment & Plan Note (Signed)
Uncontrolled cholesterol, improved from last time but still LDL >159 No longer on statin Last lipid panel 10/2017 Elevated ASCVD risk score  Plan: 1. Counseling on reducing ASCVD risk - may consider low dose other statin rosuvastatin intermittent titrate up dosing, and or start ASA 81mg  for primary ASCVD risk reduction - she declines intervention today 3. Encourage improved lifestyle - low carb/cholesterol, reduce portion size, start regular exercise 4. Follow-up lipids yearly

## 2021-01-16 NOTE — Assessment & Plan Note (Signed)
Asymptomatic. Stable, remains improved COPD, mild emphysema Improved functional respiratory status on anti cholinergic No formal PFT or Pulmonology - patient declines Infrequent flares Last LDCT image 11/2020  Plan Continue Spiriva 66mcg daily inhalation Smoking cessation - declined

## 2021-01-16 NOTE — Assessment & Plan Note (Signed)
Chronic problem Will need handicap placard 5 yr today, form comlpeted

## 2021-01-16 NOTE — Patient Instructions (Addendum)
Thank you for coming to the office today.  5 yr handicap placard form.  Contact us for refill on Spiriva inhaler when ready  Keep with Psychiatry  Good results, elevated cholesterol however.  Please schedule a Follow-up Appointment to: Return in about 6 months (around 07/19/2021) for 6 month follow-up HTN.  If you have any other questions or concerns, please feel free to call the office or send a message through [facility]. You may also schedule an earlier appointment if necessary.  Additionally, you may be receiving a survey about your experience at our office within a few days to 1 week by e-mail or mail. We value your feedback.  Nobie Putnam, DO [place]

## 2021-01-30 NOTE — Progress Notes (Signed)
Virtual Visit via Telephone Note  I connected with Mackenzie Ward on 02/02/21 at  2:00 PM EDT by telephone and verified that I am speaking with the correct person using two identifiers.  Location: Patient: home Provider: office Persons participated in the visit- patient, provider   I discussed the limitations, risks, security and privacy concerns of performing an evaluation and management service by telephone and the availability of in person appointments. I also discussed with the patient that there may be a patient responsible charge related to this service. The patient expressed understanding and agreed to proceed.   I discussed the assessment and treatment plan with the patient. The patient was provided an opportunity to ask questions and all were answered. The patient agreed with the plan and demonstrated an understanding of the instructions.   The patient was advised to call back or seek an in-person evaluation if the symptoms worsen or if the condition fails to improve as anticipated.  I provided 32 minutes of non-face-to-face time during this encounter.   Norman Clay, MD    [facility] MD/PA/NP OP Progress Note  02/02/2021 2:51 PM AMBREEN TUFTE  MRN:  [id number]  Chief Complaint:  Chief Complaint    Follow-up; Depression; Anxiety     HPI:  Mackenzie Ward is a 75 y.o. year old female with a history of bipolar disorder, COPD, hyperlipidemia, chronic low back pain, who is transferred from Dr. Toy Care.   She states that she was diagnosed with bipolar disorder a few years ago.  She says "biggest enemy was my mouth", stating that she used to say inappropriate things when she was young.  She believes that she has not had any mood swings since she started on medication.  However, she has been feeling lower lately.  She does not feel enthusiastic.  Although she has been able to make herself do things as she thinks her husband deserves better, she does not have any motivation at times.   She tends to stay in the house due to pain and sinus infection.  She does enjoy "sister day "with her sister, who she meets at least twice a month.  She states that her mother deceased in 11/05/20.  Although she misses her mother, she feels good as her mother was released from suffering.  She denies any significant grief.  She describes her husband as biggest supporter.  She denies insomnia.  She tends to feel fatigued.  She has difficulty in concentration, and is forgetful.  She reports weight gain, which she attributes to stress eating when she feels anxious.  She denies SI.  She denies decreased need for sleep or euphonia.  She denies increased goal-directed activity.  She tends to feel anxious around people.  She denies panic attacks.  She denies alcohol use or drug use.   Daily routine: household chores,  Exercise: Employment: Retired in 2010. 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 Married twice before Number of children: 0  She grew up in [place]. She had "good childhood." She lost her mother in 11-05-2020. Her father deceased in 83.   147 lbs (130 lbs was baseline) Wt Readings from Last 3 Encounters:  01/16/21 146 lb 3.2 oz (66.3 kg)  11/24/20 146 lb (66.2 kg)  11/08/20 145  lb (65.8 kg)    Visit Diagnosis:    ICD-10-CM   1. Bipolar affective disorder, currently depressed, mild (HCC)  F31.31 Lithium level  2. Anxiety state  F41.1 gabapentin (NEURONTIN) 300 MG capsule    QUEtiapine (SEROQUEL) 25 MG tablet    venlafaxine XR (EFFEXOR-XR) 150 MG 24 hr capsule  3. Other mixed anxiety disorders  F41.3 hydrOXYzine (ATARAX/VISTARIL) 10 MG tablet    Past Psychiatric History:  Outpatient: diagnosed with bipolar disorder, used to be seen by Dr.  Garnetta Buddy since 2017, followed by Dr. Evelene Croon Psychiatry admission: denies Previous suicide attempt: denies Past trials of medication:  fluoxetine, Trintellix (rage attack), Depakote Married x 3 times.  History of violence:   Past Medical History:  Past Medical History:  Diagnosis Date  . Asthma   . COPD (chronic obstructive pulmonary disease) (HCC)   . Depression   . High cholesterol     Past Surgical History:  Procedure Laterality Date  . BREAST SURGERY Right 2013  . CATARACT EXTRACTION W/PHACO Left 08/26/2019   Procedure: CATARACT EXTRACTION PHACO AND INTRAOCULAR LENS PLACEMENT (IOC) LEFT  4.45    00:40.5    11.0%;  Surgeon: Lockie Mola, MD;  Location: [facility];  Service: Ophthalmology;  Laterality: Left;  . CATARACT EXTRACTION W/PHACO Right 09/30/2019   Procedure: CATARACT EXTRACTION PHACO AND INTRAOCULAR LENS PLACEMENT (IOC) RIGHT 4.05  00:44.4  9.1% ;  Surgeon: Lockie Mola, MD;  Location: [facility];  Service: Ophthalmology;  Laterality: Right;  . COLONOSCOPY  15 years ago  . COLONOSCOPY WITH PROPOFOL N/A 11/10/2018   Procedure: COLONOSCOPY WITH PROPOFOL;  Surgeon: Toney Reil, MD;  Location: [facility];  Service: Gastroenterology;  Laterality: N/A;  . EYE SURGERY    . TONSILLECTOMY      Family Psychiatric History: as below  Family History:  Family History  Problem Relation Age of Onset  . Lymphoma Mother        Non Hodgkin  . Dementia Mother   . Alzheimer's disease Mother   . Lymphoma Sister        Non Hodgkin  . Depression Sister   . Heart attack Father   . Alcohol abuse Father   . Dementia Brother   . Dementia Maternal Uncle   . Alzheimer's disease Maternal Aunt   . Alzheimer's disease Paternal Aunt   . Alzheimer's disease Paternal Grandmother   . Alzheimer's disease Maternal Aunt   . Alzheimer's disease Paternal Aunt     Social History:  Social History   Socioeconomic History  . Marital status: Married     Spouse name: Not on file  . Number of children: Not on file  . Years of education: Not on file  . Highest education level: Some college, no degree  Occupational History  . Occupation: retired  Tobacco Use  . Smoking status: Current Every Day Smoker    Packs/day: 1.00    Years: 45.00    Pack years: 45.00    Types: Cigarettes  . Smokeless tobacco: Never Used  Vaping Use  . Vaping Use: Never used  Substance and Sexual Activity  . Alcohol use: No    Alcohol/week: 0.0 standard drinks  . Drug use: No  . Sexual activity: Not Currently  Other Topics Concern  . Not on file  Social History Narrative   Goes to dinner with friends often, goes to see her mom at nursing home on a regular basis    Social Determinants of Corporate investment banker  Strain: Low Risk   . Difficulty of Paying Living Expenses: Not hard at all  Food Insecurity: No Food Insecurity  . Worried About Charity fundraiser in the Last Year: Never true  . Ran Out of Food in the Last Year: Never true  Transportation Needs: No Transportation Needs  . Lack of Transportation (Medical): No  . Lack of Transportation (Non-Medical): No  Physical Activity: Inactive  . Days of Exercise per Week: 0 days  . Minutes of Exercise per Session: 0 min  Stress: No Stress Concern Present  . Feeling of Stress : Not at all  Social Connections: Not on file    Allergies: No Known Allergies  Metabolic Disorder Labs: Lab Results  Component Value Date   HGBA1C 5.3 01/09/2021   MPG 105 01/09/2021   MPG 111 11/27/2018   No results found for: PROLACTIN Lab Results  Component Value Date   CHOL 242 (H) 01/09/2021   TRIG 177 (H) 01/09/2021   HDL 50 01/09/2021   CHOLHDL 4.8 01/09/2021   VLDL 27 11/05/2016   LDLCALC 159 (H) 01/09/2021   LDLCALC 178 (H) 11/27/2018   Lab Results  Component Value Date   TSH 3.98 01/09/2021   TSH 4.47 11/27/2018    Therapeutic Level Labs: No results found for: LITHIUM No results found for:  VALPROATE No components found for:  CBMZ  Current Medications: Current Outpatient Medications  Medication Sig Dispense Refill  . venlafaxine XR (EFFEXOR-XR) 75 MG 24 hr capsule Take total of 225 mg daily, along with 75 mg cap 30 capsule 1  . albuterol (PROVENTIL) (2.5 MG/3ML) 0.083% nebulizer solution Take 3 mLs (2.5 mg total) by nebulization every 6 (six) hours as needed for wheezing or shortness of breath. 60 mL 1  . gabapentin (NEURONTIN) 300 MG capsule Take 1 capsule (300 mg total) by mouth 2 (two) times daily. 180 capsule 0  . hydrOXYzine (ATARAX/VISTARIL) 10 MG tablet Take 1 tablet (10 mg total) by mouth 3 (three) times daily as needed for anxiety. 270 tablet 0  . lithium carbonate 150 MG capsule Take 1 capsule (150 mg total) by mouth 2 (two) times daily with a meal. 180 capsule 0  . Nutritional Supplements (VITAMIN D BOOSTER PO) Take by mouth. (Patient not taking: No sig reported)    . QUEtiapine (SEROQUEL) 25 MG tablet Take 1 tablet (25 mg total) by mouth at bedtime. 90 tablet 0  . SPIRIVA HANDIHALER 18 MCG inhalation capsule Place 1 capsule (18 mcg total) into inhaler and inhale daily. 90 capsule 0  . venlafaxine XR (EFFEXOR-XR) 150 MG 24 hr capsule Take total of 225 mg daily, along with 75 mg cap 90 capsule 0   No current facility-administered medications for this visit.     Musculoskeletal: Strength & Muscle Tone: N/A Gait & Station: N/A Patient leans: N/A  Psychiatric Specialty Exam: Review of Systems  Psychiatric/Behavioral: Positive for decreased concentration and dysphoric mood. Negative for agitation, behavioral problems, confusion, hallucinations, self-injury, sleep disturbance and suicidal ideas. The patient is not nervous/anxious and is not hyperactive.   All other systems reviewed and are negative.   There were no vitals taken for this visit.There is no height or weight on file to calculate BMI.  General Appearance: NA  Eye Contact:  NA  Speech:  Clear and  Coherent  Volume:  Normal  Mood:  down  Affect:  NA  Thought Process:  Coherent  Orientation:  Full (Time, Place, and Person)  Thought Content: Logical  Suicidal Thoughts:  No  Homicidal Thoughts:  No  Memory:  Immediate;   Good  Judgement:  Good  Insight:  Good  Psychomotor Activity:  Normal  Concentration:  Concentration: Good and Attention Span: Good  Recall:  Good  Fund of Knowledge: Good  Language: Good  Akathisia:  No  Handed:  Right  AIMS (if indicated): not done  Assets:  Communication Skills Desire for Improvement  ADL's:  Intact  Cognition: WNL  Sleep:  Good   Screenings: GAD-7   Flowsheet Row Office Visit [facility]/07/2020 in [facility]  Total GAD-7 Score 3    PHQ2-9   [place] Office Visit from 01/16/2021 in [place] from 11/08/2020 in [facility] Video Visit from 11/07/2020 in [facility] Office Visit from 07/18/2020 in [facility] Office Visit from 01/07/2020 in [facility]  PHQ-2 Total Score 2 3 2 2 3   PHQ-9 Total Score 11 10 4 8 12     Flowsheet Row Video Visit from 11/07/2020 in [place] No Risk       Assessment and Plan:  JUN RIGHTMYER is a 75 y.o. year old female with a history of bipolar disorder, COPD, hyperlipidemia, chronic low back pain, who is transferred from Dr. Toy Care.   1. Bipolar affective disorder, currently depressed, mild ([place]) # Anxiety state She reports mild depressive symptoms and an anxiety in the context of having medical issues, which includes chronic back pain.  Will uptitrate venlafaxine to target depression and anxiety.  Discussed potential risk of medication induced mania.  Will continue lithium for bipolar disorder .  Discussed potential risk of lithium toxicity; will monitor labs.  Will continue quetiapine for bipolar disorder.  Discussed potential  metabolic side effect and EPS.  Will continue gabapentin and hydroxyzine for anxiety at this time; will consider tapering off this medication if she has good benefit from up titration of venlafaxine. Noted that her clinical course may more be more consistent with Bipolar II disorder; will continue to monitor.   Plan 1. Increase venlafaxine 225 mg daily  2. Continue lithium 150 mg twice a day 3. Continue quetiapine 25 mg at night  4. Continue gabapentin 300 mg three times a day  5. Continue hydroxyzine 10 mg three times a day 6. Check lithium level (Cre 0.71, tsh 3.9 in May 2022) 7. Next appointment: 7/7 at 2 PM for 30 mins, phone. And 8/15 at 9 AM for 30 mins, in person    The patient demonstrates the following risk factors for suicide: Chronic risk factors for suicide include: psychiatric disorder of bipolar disorder and chronic pain. Acute risk factors for suicide include: unemployment. Protective factors for this patient include: positive social support, responsibility to others (children, family) and hope for the future. Considering these factors, the overall suicide risk at this point appears to be low. Patient is appropriate for outpatient follow up.   Norman Clay, MD 02/02/2021, 2:51 PM

## 2021-02-02 ENCOUNTER — Other Ambulatory Visit: Payer: Self-pay

## 2021-02-02 ENCOUNTER — Telehealth (INDEPENDENT_AMBULATORY_CARE_PROVIDER_SITE_OTHER): Payer: Medicare PPO | Admitting: Psychiatry

## 2021-02-02 ENCOUNTER — Encounter: Payer: Self-pay | Admitting: Psychiatry

## 2021-02-02 ENCOUNTER — Telehealth (HOSPITAL_COMMUNITY): Payer: Medicare PPO | Admitting: Psychiatry

## 2021-02-02 DIAGNOSIS — F3131 Bipolar disorder, current episode depressed, mild: Secondary | ICD-10-CM

## 2021-02-02 DIAGNOSIS — F413 Other mixed anxiety disorders: Secondary | ICD-10-CM

## 2021-02-02 DIAGNOSIS — F411 Generalized anxiety disorder: Secondary | ICD-10-CM

## 2021-02-02 MED ORDER — QUETIAPINE FUMARATE 25 MG PO TABS: 25.0000 mg | ORAL_TABLET | Freq: Every day | ORAL | 0 refills | Status: DC

## 2021-02-02 MED ORDER — VENLAFAXINE HCL ER 75 MG PO CP24: ORAL_CAPSULE | ORAL | 1 refills | Status: DC

## 2021-02-02 MED ORDER — HYDROXYZINE HCL 10 MG PO TABS: 10.0000 mg | ORAL_TABLET | Freq: Three times a day (TID) | ORAL | 0 refills | Status: DC | PRN

## 2021-02-02 MED ORDER — GABAPENTIN 300 MG PO CAPS: 300.0000 mg | ORAL_CAPSULE | Freq: Two times a day (BID) | ORAL | 0 refills | Status: DC

## 2021-02-02 MED ORDER — VENLAFAXINE HCL ER 150 MG PO CP24: ORAL_CAPSULE | ORAL | 0 refills | Status: DC

## 2021-03-15 NOTE — Progress Notes (Signed)
Virtual Visit via Telephone Note  I connected with Mackenzie Ward on 03/16/21 at  1:20 PM EDT by telephone and verified that I am speaking with the correct person using two identifiers.  Location: Patient: home Provider: office Persons participated in the visit- patient, provider    I discussed the limitations, risks, security and privacy concerns of performing an evaluation and management service by telephone and the availability of in person appointments. I also discussed with the patient that there may be a patient responsible charge related to this service. The patient expressed understanding and agreed to proceed.    I discussed the assessment and treatment plan with the patient. The patient was provided an opportunity to ask questions and all were answered. The patient agreed with the plan and demonstrated an understanding of the instructions.   The patient was advised to call back or seek an in-person evaluation if the symptoms worsen or if the condition fails to improve as anticipated.  I provided 12 minutes of non-face-to-face time during this encounter.   Norman Clay, MD    [facility] MD/PA/NP OP Progress Note  03/16/2021 1:43 PM Mackenzie Ward  MRN:  [id number]  Chief Complaint:  Chief Complaint   Follow-up; Other; Anxiety    HPI:  This is a follow-up appointment for bipolar disorder and then anxiety.  She states that her husband died from a heart attack last December 13, 2022.  She misses him terribly, and it was so sudden.  Her family has been visiting the patient.  She has been trying to keep the routine.  She thinks that she has been doing fine at the bottom line.  She reports good support from her sister, who she talks with every day.  Although she occasionally wants to be by herself, she also wants to be gracious.  She denies insomnia.  Although she feels down at times, she thinks it has been good.  She has good concentration.  She has slightly decreased appetite since the loss  of her husband.  She denies SI.  She feels anxious and tense with occasional panic attacks.  Although she initially asks if she can have any medication for anxiety, she agrees to stay on the current medication at this time.  She denies decreased need for sleep or euphonia.   Daily routine: household chores,  Exercise: Employment: Retired in 2010. 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 Married twice before Number of children: 0 She grew up in [place]. She had "good childhood." She lost her mother in November 11, 2020. Her father deceased in 17.   Visit Diagnosis:    ICD-10-CM   1. Bipolar affective disorder, currently depressed, mild ([place])  F31.31       Past Psychiatric History: Please see initial evaluation for full details. I have reviewed the history. No updates at this time.     Past Medical History:  Past Medical History:  Diagnosis Date   Asthma    COPD (chronic obstructive pulmonary disease) ([place])    Depression    High cholesterol     Past Surgical History:  Procedure Laterality Date   BREAST SURGERY Right 2013   CATARACT EXTRACTION W/PHACO Left 08/26/2019   Procedure: CATARACT EXTRACTION PHACO AND INTRAOCULAR LENS PLACEMENT (  IOC) LEFT  4.45    00:40.5    11.0%;  Surgeon: Leandrew Koyanagi, MD;  Location: [place];  Service: Ophthalmology;  Laterality: Left;   CATARACT EXTRACTION W/PHACO Right 09/30/2019   Procedure: CATARACT EXTRACTION PHACO AND INTRAOCULAR LENS PLACEMENT (IOC) RIGHT 4.05  00:44.4  9.1% ;  Surgeon: Leandrew Koyanagi, MD;  Location: [place];  Service: Ophthalmology;  Laterality: Right;   COLONOSCOPY  15 years ago   COLONOSCOPY WITH PROPOFOL N/A 11/10/2018    Procedure: COLONOSCOPY WITH PROPOFOL;  Surgeon: Lin Landsman, MD;  Location[facility] ENDOSCOPY;  Service: Gastroenterology;  Laterality: N/A;   EYE SURGERY     TONSILLECTOMY      Family Psychiatric History: Please see initial evaluation for full details. I have reviewed the history. No updates at this time.     Family History:  Family History  Problem Relation Age of Onset   Lymphoma Mother        Non Hodgkin   Dementia Mother    Alzheimer's disease Mother    Lymphoma Sister        Non Hodgkin   Depression Sister    Heart attack Father    Alcohol abuse Father    Dementia Brother    Dementia Maternal Uncle    Alzheimer's disease Maternal Aunt    Alzheimer's disease Paternal Aunt    Alzheimer's disease Paternal Grandmother    Alzheimer's disease Maternal Aunt    Alzheimer's disease Paternal Aunt     Social History:  Social History   Socioeconomic History   Marital status: Married    Spouse name: Not on file   Number of children: Not on file   Years of education: Not on file   Highest education level: Some college, no degree  Occupational History   Occupation: retired  Tobacco Use   Smoking status: Every Day    Packs/day: 1.00    Years: 45.00    Pack years: 45.00    Types: Cigarettes   Smokeless tobacco: Never  Vaping Use   Vaping Use: Never used  Substance and Sexual Activity   Alcohol use: No    Alcohol/week: 0.0 standard drinks   Drug use: No   Sexual activity: Not Currently  Other Topics Concern   Not on file  Social History Narrative   Goes to dinner with friends often, goes to see her mom at nursing home on a regular basis    Social Determinants of Health   Financial Resource Strain: Low Risk    Difficulty of Paying Living Expenses: Not hard at all  Food Insecurity: No Food Insecurity   Worried About Charity fundraiser in the Last Year: Never true   Arboriculturist in the Last Year: Never true  Transportation Needs: No Transportation Needs    Lack of Transportation (Medical): No   Lack of Transportation (Non-Medical): No  Physical Activity: Inactive   Days of Exercise per Week: 0 days   Minutes of Exercise per Session: 0 min  Stress: No Stress Concern Present   Feeling of Stress : Not at all  Social Connections: Not on file    Allergies: No Known Allergies  Metabolic Disorder Labs: Lab Results  Component Value Date   HGBA1C 5.3 01/09/2021   MPG 105 01/09/2021   MPG 111 11/27/2018   No results found for: PROLACTIN Lab Results  Component Value Date   CHOL 242 (H) 01/09/2021   TRIG 177 (H) 01/09/2021   HDL 50  01/09/2021   CHOLHDL 4.8 01/09/2021   VLDL 27 11/05/2016   LDLCALC 159 (H) 01/09/2021   LDLCALC 178 (H) 11/27/2018   Lab Results  Component Value Date   TSH 3.98 01/09/2021   TSH 4.47 11/27/2018    Therapeutic Level Labs: No results found for: LITHIUM No results found for: VALPROATE No components found for:  CBMZ  Current Medications: Current Outpatient Medications  Medication Sig Dispense Refill   albuterol (PROVENTIL) (2.5 MG/3ML) 0.083% nebulizer solution Take 3 mLs (2.5 mg total) by nebulization every 6 (six) hours as needed for wheezing or shortness of breath. 60 mL 1   gabapentin (NEURONTIN) 300 MG capsule Take 1 capsule (300 mg total) by mouth 2 (two) times daily. 180 capsule 0   hydrOXYzine (ATARAX/VISTARIL) 10 MG tablet Take 1 tablet (10 mg total) by mouth 3 (three) times daily as needed for anxiety. 270 tablet 0   lithium carbonate 150 MG capsule Take 1 capsule (150 mg total) by mouth 2 (two) times daily with a meal. 180 capsule 0   Nutritional Supplements (VITAMIN D BOOSTER PO) Take by mouth. (Patient not taking: No sig reported)     QUEtiapine (SEROQUEL) 25 MG tablet Take 1 tablet (25 mg total) by mouth at bedtime. 90 tablet 0   SPIRIVA HANDIHALER 18 MCG inhalation capsule Place 1 capsule (18 mcg total) into inhaler and inhale daily. 90 capsule 0   venlafaxine XR (EFFEXOR-XR) 150 MG 24 hr  capsule Take total of 225 mg daily, along with 75 mg cap 90 capsule 0   [START ON 04/04/2021] venlafaxine XR (EFFEXOR-XR) 75 MG 24 hr capsule Take total of 225 mg daily, along with 75 mg cap 90 capsule 0   No current facility-administered medications for this visit.     Musculoskeletal: Strength & Muscle Tone:  N/A Gait & Station:  N/A Patient leans: N/A  Psychiatric Specialty Exam: Review of Systems  Psychiatric/Behavioral:  Positive for dysphoric mood. Negative for agitation, behavioral problems, confusion, decreased concentration, hallucinations, self-injury, sleep disturbance and suicidal ideas. The patient is nervous/anxious. The patient is not hyperactive.   All other systems reviewed and are negative.  There were no vitals taken for this visit.There is no height or weight on file to calculate BMI.  General Appearance: NA  Eye Contact:  NA  Speech:  Clear and Coherent  Volume:  Normal  Mood:  Anxious  Affect:  NA  Thought Process:  Coherent  Orientation:  Full (Time, Place, and Person)  Thought Content: Logical   Suicidal Thoughts:  No  Homicidal Thoughts:  No  Memory:  Immediate;   Good  Judgement:  Good  Insight:  Good  Psychomotor Activity:  Normal  Concentration:  Concentration: Good and Attention Span: Good  Recall:  Good  Fund of Knowledge: Good  Language: Good  Akathisia:  No  Handed:  Right  AIMS (if indicated): not done  Assets:  Communication Skills Desire for Improvement  ADL's:  Intact  Cognition: WNL  Sleep:  Good   Screenings: GAD-7    Flowsheet Row Office Visit from 01/07/2020 in [facility]  Total GAD-7 Score 3      PHQ2-9    [place] Office Visit from 01/16/2021 in [place] from 11/08/2020 in [facility] Video Visit from 11/07/2020 in [facility] Office Visit from 07/18/2020 in [facility] Office Visit from 01/07/2020 in [facility]  PHQ-2 Total Score  2 3 2 2 3   PHQ-9 Total Score 11 10 4 8 12       Flowsheet Row Video Visit from 11/07/2020 in [place] No Risk        Assessment and Plan:  ELMINA HENDEL is a 75 y.o. year old female with a history of bipolar disorder, COPD, hyperlipidemia, chronic low back pain, who presents for follow up appointment for below.   1. Bipolar affective disorder, currently depressed, mild ([place]) # Anxiety state Although she reports slight worsening in anxiety in the context of loss of her husband last week, she has been handling things relatively well.  Other psychosocial stressors includes chronic back pain.  Will continue current medication regimen.  Will continue venlafaxine to target depression.  We will continue lithium and quetiapine to target bipolar disorder.  Will continue gabapentin and hydroxyzine for anxiety; will consider tapering it down in the future as her anxiety improves to avoid polypharmacy. Noted that her clinical course may more be more consistent with Bipolar II disorder; will continue to monitor.   This clinician has discussed the side effect associated with medication prescribed during this encounter. Please refer to notes in the previous encounters for more details.     Plan 1. Continue venlafaxine 225 mg daily 2. Continue lithium 150 mg twice a day 3. Continue quetiapine 25 mg at night 4. Continue gabapentin 300 mg twice a day 5. Continue hydroxyzine 10 mg three times a day 6. Check lithium level (Cre 0.71, tsh 3.9 in May 2022)- reminded the patient again 7. Next appointment: 8/15 at 9 AM for 30 mins, in person     The patient demonstrates the following risk factors for suicide: Chronic risk factors for suicide include: psychiatric disorder of bipolar disorder and chronic pain. Acute risk factors for suicide include: unemployment. Protective factors for this patient include: positive  social support, responsibility to others (children, family) and hope for the future. Considering these factors, the overall suicide risk at this point appears to be low. Patient is appropriate for outpatient follow up.       Norman Clay, MD 03/16/2021, 1:43 PM

## 2021-03-16 ENCOUNTER — Telehealth (INDEPENDENT_AMBULATORY_CARE_PROVIDER_SITE_OTHER): Payer: Medicare PPO | Admitting: Psychiatry

## 2021-03-16 ENCOUNTER — Encounter: Payer: Self-pay | Admitting: Psychiatry

## 2021-03-16 ENCOUNTER — Other Ambulatory Visit: Payer: Self-pay

## 2021-03-16 DIAGNOSIS — F3131 Bipolar disorder, current episode depressed, mild: Secondary | ICD-10-CM

## 2021-03-16 MED ORDER — VENLAFAXINE HCL ER 75 MG PO CP24: ORAL_CAPSULE | ORAL | 0 refills | Status: DC

## 2021-03-30 ENCOUNTER — Other Ambulatory Visit: Payer: Self-pay | Admitting: Family Medicine

## 2021-03-30 DIAGNOSIS — J432 Centrilobular emphysema: Secondary | ICD-10-CM

## 2021-03-30 DIAGNOSIS — R0600 Dyspnea, unspecified: Secondary | ICD-10-CM

## 2021-03-30 DIAGNOSIS — R0609 Other forms of dyspnea: Secondary | ICD-10-CM

## 2021-04-01 ENCOUNTER — Other Ambulatory Visit (HOSPITAL_COMMUNITY): Payer: Self-pay | Admitting: Psychiatry

## 2021-04-01 DIAGNOSIS — F3170 Bipolar disorder, currently in remission, most recent episode unspecified: Secondary | ICD-10-CM

## 2021-04-03 ENCOUNTER — Other Ambulatory Visit (HOSPITAL_COMMUNITY): Payer: Self-pay | Admitting: Psychiatry

## 2021-04-03 DIAGNOSIS — F3170 Bipolar disorder, currently in remission, most recent episode unspecified: Secondary | ICD-10-CM

## 2021-04-03 NOTE — Telephone Encounter (Signed)
Ordered lithium based on the request from the pharmacy. Please contact the patient and remind her to obtain blood test/lithium.

## 2021-04-04 NOTE — Telephone Encounter (Signed)
Thank you.  Provider acknowledged and reviewed message.

## 2021-04-13 DIAGNOSIS — F3131 Bipolar disorder, current episode depressed, mild: Secondary | ICD-10-CM | POA: Diagnosis not present

## 2021-04-14 LAB — LITHIUM LEVEL: Lithium Lvl: 0.7 mmol/L (ref 0.5–1.2)

## 2021-04-17 ENCOUNTER — Encounter: Payer: Self-pay | Admitting: Psychiatry

## 2021-04-19 NOTE — Progress Notes (Signed)
[place] MD/PA/NP OP Progress Note  04/24/2021 9:36 AM Mackenzie Ward  MRN:  [id number]  Chief Complaint:  Chief Complaint   Follow-up; Other    HPI:  This is a follow-up appointment for bipolar disorder.  She states that she has been doing good.  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  She is planning to move into her sister, who has been very supportive to her.  Although she feels depressed at times, she is being able to enjoy doing things.  She enjoys watching a fountain in front of her house, listening to birds.  She sleeps well.  She denies anhedonia.  She has fair energy.  She has occasional increase in appetite.  She denies SI.  She denies decreased need for sleep or euphonia.  She would like to stay on quetiapine as it helps for her sleep.  She feels anxious and tense.  Although she initially asks if she can make any change in her medication to attend to anxiety, she agrees to stay on the current medication regimen at this time except tapering down lithium.   Daily routine: household chores,  Exercise: Employment: Retired in 2010. 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 Married twice before Number of children: 0 She grew up in [place]. She had "good childhood." She lost her mother in 11-15-20. Her father deceased in 74.    Wt Readings from Last 3 Encounters:  04/24/21 148 lb 4.8 oz (67.3 kg)  01/16/21 146 lb 3.2 oz (66.3 kg)  11/24/20 146 lb (66.2 kg)     Visit Diagnosis:    ICD-10-CM   1. Anxiety state  F41.1 gabapentin (NEURONTIN) 300 MG capsule    QUEtiapine (SEROQUEL) 25 MG tablet    venlafaxine XR (EFFEXOR-XR) 150 MG 24 hr capsule      Past Psychiatric History:  Please see initial evaluation for full details. I have reviewed the history. No updates at this time.     Past Medical History:  Past Medical History:  Diagnosis Date   Asthma    COPD (chronic obstructive pulmonary disease) ([place])    Depression    High cholesterol     Past Surgical History:  Procedure Laterality Date   BREAST SURGERY Right 2013   CATARACT EXTRACTION W/PHACO Left 08/26/2019   Procedure: CATARACT EXTRACTION PHACO AND INTRAOCULAR LENS PLACEMENT (IOC) LEFT  4.45    00:40.5    11.0%;  Surgeon: Leandrew Koyanagi, MD;  Location: [place];  Service: Ophthalmology;  Laterality: Left;   CATARACT EXTRACTION W/PHACO Right 09/30/2019   Procedure: CATARACT EXTRACTION PHACO AND INTRAOCULAR LENS PLACEMENT (IOC) RIGHT 4.05  00:44.4  9.1% ;  Surgeon: Leandrew Koyanagi, MD;  Location: [place];  Service: Ophthalmology;  Laterality: Right;   COLONOSCOPY  15 years ago   COLONOSCOPY WITH PROPOFOL N/A 11/10/2018   Procedure: COLONOSCOPY WITH PROPOFOL;  Surgeon: Lin Landsman, MD;  Location: [facility] ENDOSCOPY;  Service: Gastroenterology;  Laterality: N/A;   EYE SURGERY     TONSILLECTOMY      Family Psychiatric History: Please see initial evaluation for full details. I have reviewed the history. No updates at this time.     Family History:  Family History  Problem Relation Age of Onset   Lymphoma Mother        Non Hodgkin   Dementia Mother    Alzheimer's disease Mother    Lymphoma Sister        Non Hodgkin   Depression Sister    Heart attack Father    Alcohol abuse Father    Dementia Brother    Dementia Maternal Uncle    Alzheimer's disease Maternal Aunt    Alzheimer's disease Paternal Aunt    Alzheimer's disease Paternal Grandmother    Alzheimer's disease Maternal Aunt    Alzheimer's disease Paternal Aunt     Social History:  Social History   Socioeconomic History   Marital status: Widowed    Spouse name: Not on file   Number of children: Not  on file   Years of education: Not on file   Highest education level: Some college, no degree  Occupational History   Occupation: retired  Tobacco Use   Smoking status: Every Day    Packs/day: 1.00    Years: 45.00    Pack years: 45.00    Types: Cigarettes   Smokeless tobacco: Never  Vaping Use   Vaping Use: Never used  Substance and Sexual Activity   Alcohol use: No    Alcohol/week: 0.0 standard drinks   Drug use: No   Sexual activity: Not Currently  Other Topics Concern   Not on file  Social History Narrative   Goes to dinner with friends often, goes to see her mom at nursing home on a regular basis    Social Determinants of Health   Financial Resource Strain: Low Risk    Difficulty of Paying Living Expenses: Not hard at all  Food Insecurity: No Food Insecurity   Worried About Charity fundraiser in the Last Year: Never true   Arboriculturist in the Last Year: Never true  Transportation Needs: No Transportation Needs   Lack of Transportation (Medical): No   Lack of Transportation (Non-Medical): No  Physical Activity: Inactive   Days of Exercise per Week: 0 days   Minutes of Exercise per Session: 0 min  Stress: No Stress Concern Present   Feeling of Stress : Not at all  Social Connections: Not on file    Allergies: No Known Allergies  Metabolic Disorder Labs: Lab Results  Component Value Date   HGBA1C 5.3 01/09/2021   MPG 105 01/09/2021   MPG 111 11/27/2018   No results found for: PROLACTIN Lab Results  Component Value Date   CHOL 242 (H) 01/09/2021   TRIG 177 (H) 01/09/2021   HDL 50 01/09/2021   CHOLHDL 4.8 01/09/2021   VLDL 27 11/05/2016   LDLCALC 159 (H) 01/09/2021   LDLCALC 178 (H) 11/27/2018   Lab Results  Component Value Date   TSH 3.98 01/09/2021   TSH 4.47 11/27/2018    Therapeutic Level Labs: Lab Results  Component Value Date   LITHIUM 0.7 04/13/2021   No results found for: VALPROATE No components found for:  CBMZ  Current  Medications: Current Outpatient Medications  Medication Sig Dispense Refill   albuterol (PROVENTIL) (2.5 MG/3ML) 0.083% nebulizer solution Take 3 mLs (2.5 mg total) by nebulization every 6 (six) hours as needed for wheezing or shortness of breath. 60 mL 1   hydrOXYzine (ATARAX/VISTARIL) 10 MG tablet Take 1 tablet (10 mg total) by mouth 3 (three) times daily as needed for anxiety. 270 tablet 0   lithium carbonate 150 MG  capsule Take 1 capsule (150 mg total) by mouth in the morning and at bedtime. 180 capsule 0   Nutritional Supplements (VITAMIN D BOOSTER PO) Take by mouth.     SPIRIVA HANDIHALER 18 MCG inhalation capsule Place 1 capsule (18 mcg total) into inhaler and inhale daily. 90 capsule 0   venlafaxine XR (EFFEXOR-XR) 75 MG 24 hr capsule Take total of 225 mg daily, along with 75 mg cap 90 capsule 0   [START ON 05/05/2021] gabapentin (NEURONTIN) 300 MG capsule Take 1 capsule (300 mg total) by mouth 2 (two) times daily. 180 capsule 0   [START ON 05/05/2021] QUEtiapine (SEROQUEL) 25 MG tablet Take 1 tablet (25 mg total) by mouth at bedtime. 90 tablet 0   [START ON 05/05/2021] venlafaxine XR (EFFEXOR-XR) 150 MG 24 hr capsule Take total of 225 mg daily, along with 75 mg cap 90 capsule 1   No current facility-administered medications for this visit.     Musculoskeletal: Strength & Muscle Tone: within normal limits Gait & Station:  using a cane Patient leans: N/A  Psychiatric Specialty Exam: Review of Systems  Psychiatric/Behavioral:  Positive for dysphoric mood. Negative for agitation, behavioral problems, confusion, decreased concentration, hallucinations, self-injury, sleep disturbance and suicidal ideas. The patient is nervous/anxious. The patient is not hyperactive.   All other systems reviewed and are negative.  Blood pressure 134/74, pulse (!) 102, temperature 97.6 F (36.4 C), temperature source Temporal, weight 148 lb 4.8 oz (67.3 kg).Body mass index is 30.99 kg/m.  General  Appearance: Fairly Groomed  Eye Contact:  Good  Speech:  Clear and Coherent  Volume:  Normal  Mood:   good  Affect:  Appropriate, Congruent, and euthymic  Thought Process:  Coherent  Orientation:  Full (Time, Place, and Person)  Thought Content: Logical   Suicidal Thoughts:  No  Homicidal Thoughts:  No  Memory:  Immediate;   Good  Judgement:  Good  Insight:  Good  Psychomotor Activity:  Normal- mild cogwheel rigidity on left arm, no tremors  Concentration:  Concentration: Good and Attention Span: Good  Recall:  Good  Fund of Knowledge: Good  Language: Good  Akathisia:  No  Handed:  Right  AIMS (if indicated): not done  Assets:  Communication Skills Desire for Improvement  ADL's:  Intact  Cognition: WNL  Sleep:  Good   Screenings: GAD-7    Flowsheet Row [facility] Visit [facility] 01/07/2020 in [facility]  Total GAD-7 Score 3      PHQ2-9    [place] Office Visit from 04/24/2021 in [place] Office Visit from 01/16/2021 in [place] from 11/08/2020 in [facility] Video Visit from 11/07/2020 in [facility] Office Visit from 07/18/2020 in [facility]  PHQ-2 Total Score '1 2 3 2 2  '$ PHQ-9 Total Score -- '11 10 4 8      '$ [place] Visit from 04/24/2021 in [place] Video Visit from 11/07/2020 in [place] No Risk No Risk        Assessment and Plan:  Mackenzie Ward is a 75 y.o. year old female with a history of bipolar disorder, COPD, hyperlipidemia, chronic low back pain, who presents for follow up appointment for below.    1. Bipolar affective disorder, currently depressed, mild ([place]) # Anxiety state Although she reports occasional anxiety in the context of loss of her husband  in July, she has been handling things relatively well, and has good  connection with her family, including her sister.  Other psychosocial stressors includes chronic back pain.  Will continue venlafaxine to target depression.  Will continue quetiapine to target bipolar disorder.  Noted that she has slight rigidity on left arm; will not plan to uptitrate this medication at this time.  Will lower the dose of lithium given she denies any significant manic episode and to avoid polypharmacy.  She is advised to contact the office if any worsening in her symptoms.  Will continue gabapentin and hydroxyzine for anxiety. Noted that her clinical course may more be more consistent with Bipolar II disorder; will continue to monitor.    This clinician has discussed the side effect associated with medication prescribed during this encounter. Please refer to notes in the previous encounters for more details.     Plan 1. Continue venlafaxine 225 mg daily 2. Decrease lithium 150 mg daily (was on 150 mg BID) 3. Continue quetiapine 25 mg at night- mild  rigidity on left arm 4. Continue gabapentin 300 mg twice a day 5. Continue hydroxyzine 10 mg three times a day 7. Next appointment: 10/14 at 8:30 for 30 mins, phone - on Cre 0.71, tsh 3.9 in May 2022, Lithium 0.7 in 04/2021    The patient demonstrates the following risk factors for suicide: Chronic risk factors for suicide include: psychiatric disorder of bipolar disorder and chronic pain. Acute risk factors for suicide include: unemployment. Protective factors for this patient include: positive social support, responsibility to others (children, family) and hope for the future. Considering these factors, the overall suicide risk at this point appears to be low. Patient is appropriate for outpatient follow up.      Norman Clay, MD 04/24/2021, 9:36 AM

## 2021-04-24 ENCOUNTER — Encounter: Payer: Self-pay | Admitting: Psychiatry

## 2021-04-24 ENCOUNTER — Other Ambulatory Visit: Payer: Self-pay

## 2021-04-24 ENCOUNTER — Ambulatory Visit (INDEPENDENT_AMBULATORY_CARE_PROVIDER_SITE_OTHER): Payer: Medicare PPO | Admitting: Psychiatry

## 2021-04-24 DIAGNOSIS — F411 Generalized anxiety disorder: Secondary | ICD-10-CM | POA: Diagnosis not present

## 2021-04-24 MED ORDER — GABAPENTIN 300 MG PO CAPS: 300.0000 mg | ORAL_CAPSULE | Freq: Two times a day (BID) | ORAL | 0 refills | Status: DC

## 2021-04-24 MED ORDER — QUETIAPINE FUMARATE 25 MG PO TABS: 25.0000 mg | ORAL_TABLET | Freq: Every day | ORAL | 0 refills | Status: DC

## 2021-04-24 MED ORDER — VENLAFAXINE HCL ER 150 MG PO CP24: ORAL_CAPSULE | ORAL | 1 refills | Status: DC

## 2021-04-24 NOTE — Patient Instructions (Signed)
1. Continue venlafaxine 225 mg daily 2. Decrease lithium 150 mg daily  3. Continue quetiapine 25 mg at night 4. Continue gabapentin 300 mg twice a day 5. Continue hydroxyzine 10 mg three times a day 7. Next appointment: 10/14 at 8:30

## 2021-05-08 DIAGNOSIS — H5010 Unspecified exotropia: Secondary | ICD-10-CM | POA: Diagnosis not present

## 2021-05-08 DIAGNOSIS — Z0100 Encounter for examination of eyes and vision without abnormal findings: Secondary | ICD-10-CM | POA: Diagnosis not present

## 2021-05-09 DIAGNOSIS — G8929 Other chronic pain: Secondary | ICD-10-CM | POA: Diagnosis not present

## 2021-05-09 DIAGNOSIS — G629 Polyneuropathy, unspecified: Secondary | ICD-10-CM | POA: Diagnosis not present

## 2021-05-09 DIAGNOSIS — E669 Obesity, unspecified: Secondary | ICD-10-CM | POA: Diagnosis not present

## 2021-05-09 DIAGNOSIS — F4323 Adjustment disorder with mixed anxiety and depressed mood: Secondary | ICD-10-CM | POA: Diagnosis not present

## 2021-05-09 DIAGNOSIS — J439 Emphysema, unspecified: Secondary | ICD-10-CM | POA: Diagnosis not present

## 2021-05-09 DIAGNOSIS — F411 Generalized anxiety disorder: Secondary | ICD-10-CM | POA: Diagnosis not present

## 2021-05-09 DIAGNOSIS — F17210 Nicotine dependence, cigarettes, uncomplicated: Secondary | ICD-10-CM | POA: Diagnosis not present

## 2021-05-09 DIAGNOSIS — F319 Bipolar disorder, unspecified: Secondary | ICD-10-CM | POA: Diagnosis not present

## 2021-05-09 DIAGNOSIS — I739 Peripheral vascular disease, unspecified: Secondary | ICD-10-CM | POA: Diagnosis not present

## 2021-06-17 NOTE — Progress Notes (Signed)
Virtual Visit via Telephone Note  I connected with Mackenzie Ward on 06/23/21 at  8:30 AM EDT by telephone and verified that I am speaking with the correct person using two identifiers.  Location: Patient: home Provider: office Persons participated in the visit- patient, provider    I discussed the limitations, risks, security and privacy concerns of performing an evaluation and management service by telephone and the availability of in person appointments. I also discussed with the patient that there may be a patient responsible charge related to this service. The patient expressed understanding and agreed to proceed.  I discussed the assessment and treatment plan with the patient. The patient was provided an opportunity to ask questions and all were answered. The patient agreed with the plan and demonstrated an understanding of the instructions.   The patient was advised to call back or seek an in-person evaluation if the symptoms worsen or if the condition fails to improve as anticipated.  I provided 12 minutes of non-face-to-face time during this encounter.   Norman Clay, MD     [facility] MD/PA/NP OP Progress Note  06/23/2021 8:51 AM Mackenzie Ward  MRN:  [id number]  Chief Complaint:  Chief Complaint   Follow-up; Other    HPI:  This is a follow-up appointment for bipolar disorder.  She states that she had to put her dog to sleep.  He was not able to stand up by himself.  Although it was not her decision, she thinks it was the right decision.  She has been still trying to get things in order.  She is planning to transfer the land to his son, and has been working on paperwork.  She is planning to close down the business.  She is planning to meet with her sister after things are settled.  She does not think it is safe to live by herself given her age.  She is also hoping that she has more social contact, which she thinks is needed.  Although she occasionally feels depressed, she  has been handling things well.  She enjoys sitting outside, watching animals.  She sleeps very well with quetiapine.  She denies any change in appetite.  She denies SI.  She denies decreased need for sleep or euphonia.  She does not recall the conversation to try lowering the dose of lithium.  She is willing to do it this time.   Daily routine: household chores,  Exercise: Employment: Retired in 2010. 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 Married twice before Number of children: 0 She grew up in [place]. She had "good childhood." She lost her mother in November 24, 2020. Her father deceased in 19.    Visit Diagnosis:    ICD-10-CM   1. Bipolar disorder in remission (HCC)  F31.70 lithium carbonate 150 MG capsule    2. Anxiety state  F41.1 QUEtiapine (SEROQUEL) 25 MG tablet    gabapentin (NEURONTIN) 300 MG capsule      Past Psychiatric History: Please see initial evaluation for full details. I have reviewed the history. No updates at this time.     Past Medical History:  Past Medical History:  Diagnosis Date   Asthma    COPD (chronic obstructive pulmonary disease) ([place])    Depression  High cholesterol     Past Surgical History:  Procedure Laterality Date   BREAST SURGERY Right 2013   CATARACT EXTRACTION W/PHACO Left 08/26/2019   Procedure: CATARACT EXTRACTION PHACO AND INTRAOCULAR LENS PLACEMENT (IOC) LEFT  4.45    00:40.5    11.0%;  Surgeon: Leandrew Koyanagi, MD;  Location: [place];  Service: Ophthalmology;  Laterality: Left;   CATARACT EXTRACTION W/PHACO Right 09/30/2019   Procedure: CATARACT EXTRACTION PHACO AND INTRAOCULAR LENS PLACEMENT (IOC) RIGHT 4.05  00:44.4  9.1% ;  Surgeon: Leandrew Koyanagi, MD;  Location:  [place];  Service: Ophthalmology;  Laterality: Right;   COLONOSCOPY  15 years ago   COLONOSCOPY WITH PROPOFOL N/A 11/10/2018   Procedure: COLONOSCOPY WITH PROPOFOL;  Surgeon: Lin Landsman, MD;  Location[facility];  Service: Gastroenterology;  Laterality: N/A;   EYE SURGERY     TONSILLECTOMY      Family Psychiatric History: Please see initial evaluation for full details. I have reviewed the history. No updates at this time.    Family History:  Family History  Problem Relation Age of Onset   Lymphoma Mother        Non Hodgkin   Dementia Mother    Alzheimer's disease Mother    Lymphoma Sister        Non Hodgkin   Depression Sister    Heart attack Father    Alcohol abuse Father    Dementia Brother    Dementia Maternal Uncle    Alzheimer's disease Maternal Aunt    Alzheimer's disease Paternal Aunt    Alzheimer's disease Paternal Grandmother    Alzheimer's disease Maternal Aunt    Alzheimer's disease Paternal Aunt     Social History:  Social History   Socioeconomic History   Marital status: Widowed    Spouse name: Not on file   Number of children: Not on file   Years of education: Not on file   Highest education level: Some college, no degree  Occupational History   Occupation: retired  Tobacco Use   Smoking status: Every Day    Packs/day: 1.00    Years: 45.00    Pack years: 45.00    Types: Cigarettes   Smokeless tobacco: Never  Vaping Use   Vaping Use: Never used  Substance and Sexual Activity   Alcohol use: No    Alcohol/week: 0.0 standard drinks   Drug use: No   Sexual activity: Not Currently  Other Topics Concern   Not on file  Social History Narrative   Goes to dinner with friends often, goes to see her mom at nursing home on a regular basis    Social Determinants of Health   Financial Resource Strain: Low Risk    Difficulty of Paying Living Expenses: Not hard at all  Food Insecurity: No Food Insecurity   Worried About Paediatric nurse in the Last Year: Never true   Arboriculturist in the Last Year: Never true  Transportation Needs: No Transportation Needs   Lack of Transportation (Medical): No   Lack of Transportation (Non-Medical): No  Physical Activity: Inactive   Days of Exercise per Week: 0 days   Minutes of Exercise per Session: 0 min  Stress: No Stress Concern Present   Feeling of Stress : Not at all  Social Connections: Not on file    Allergies: No Known Allergies  Metabolic Disorder Labs: Lab Results  Component Value Date   HGBA1C 5.3 01/09/2021   MPG 105  01/09/2021   MPG 111 11/27/2018   No results found for: PROLACTIN Lab Results  Component Value Date   CHOL 242 (H) 01/09/2021   TRIG 177 (H) 01/09/2021   HDL 50 01/09/2021   CHOLHDL 4.8 01/09/2021   VLDL 27 11/05/2016   LDLCALC 159 (H) 01/09/2021   LDLCALC 178 (H) 11/27/2018   Lab Results  Component Value Date   TSH 3.98 01/09/2021   TSH 4.47 11/27/2018    Therapeutic Level Labs: Lab Results  Component Value Date   LITHIUM 0.7 04/13/2021   No results found for: VALPROATE No components found for:  CBMZ  Current Medications: Current Outpatient Medications  Medication Sig Dispense Refill   albuterol (PROVENTIL) (2.5 MG/3ML) 0.083% nebulizer solution Take 3 mLs (2.5 mg total) by nebulization every 6 (six) hours as needed for wheezing or shortness of breath. 60 mL 1   [START ON 08/04/2021] gabapentin (NEURONTIN) 300 MG capsule Take 1 capsule (300 mg total) by mouth 2 (two) times daily. 180 capsule 1   hydrOXYzine (ATARAX/VISTARIL) 10 MG tablet Take 1 tablet (10 mg total) by mouth 3 (three) times daily as needed for anxiety. 270 tablet 0   [START ON 07/03/2021] lithium carbonate 150 MG capsule Take 1 capsule (150 mg total) by mouth at bedtime. 90 capsule 0   Nutritional Supplements (VITAMIN D BOOSTER PO) Take by mouth.     [START ON 08/05/2021] QUEtiapine (SEROQUEL) 25 MG tablet Take 1 tablet (25 mg total) by mouth at bedtime.  90 tablet 0   SPIRIVA HANDIHALER 18 MCG inhalation capsule Place 1 capsule (18 mcg total) into inhaler and inhale daily. 90 capsule 0   venlafaxine XR (EFFEXOR-XR) 150 MG 24 hr capsule Take total of 225 mg daily, along with 75 mg cap 90 capsule 1   venlafaxine XR (EFFEXOR-XR) 75 MG 24 hr capsule Take total of 225 mg daily, along with 75 mg cap 90 capsule 0   No current facility-administered medications for this visit.     Musculoskeletal: Strength & Muscle Tone:  N/A Gait & Station:  N/A Patient leans: N/A  Psychiatric Specialty Exam: Review of Systems  Psychiatric/Behavioral:  Positive for dysphoric mood. Negative for agitation, behavioral problems, confusion, decreased concentration, hallucinations, self-injury, sleep disturbance and suicidal ideas. The patient is nervous/anxious. The patient is not hyperactive.   All other systems reviewed and are negative.  There were no vitals taken for this visit.There is no height or weight on file to calculate BMI.  General Appearance: Fairly Groomed  Eye Contact:  Good  Speech:  Clear and Coherent  Volume:  Normal  Mood:   good  Affect:  NA  Thought Process:  Coherent  Orientation:  Full (Time, Place, and Person)  Thought Content: Logical   Suicidal Thoughts:  No  Homicidal Thoughts:  No  Memory:  Immediate;   Good  Judgement:  Good  Insight:  Good  Psychomotor Activity:  Normal  Concentration:  Concentration: Good and Attention Span: Good  Recall:  Good  Fund of Knowledge: Good  Language: Good  Akathisia:  No  Handed:  Right  AIMS (if indicated): not done  Assets:  Communication Skills Desire for Improvement  ADL's:  Intact  Cognition: WNL  Sleep:  Good   Screenings: GAD-7    Flowsheet Row Office Visit from 01/07/2020 in [facility]  Total GAD-7 Score 3      PHQ2-9    Flowsheet Row Video Visit from 06/23/2021 in [place] Office Visit  from 04/24/2021 in [place] Office Visit from 01/16/2021 in [place] from 11/08/2020 in [facility] Video Visit from 11/07/2020 in [facility]  PHQ-2 Total Score 1 1 2 3 2   PHQ-9 Total Score -- -- 11 10 4       Flowsheet Row Video Visit from 06/23/2021 in [place] Office Visit from 04/24/2021 in [place] Video Visit from 11/07/2020 in [place] No Risk No Risk No Risk        Assessment and Plan:  Mackenzie Ward is a 75 y.o. year old female with a history of bipolar disorder, COPD, hyperlipidemia, chronic low back pain, who presents for follow up appointment for below.    1. Bipolar disorder in remission ([place]) 2. Anxiety state Although she continues to report occasional depressed mood and an anxiety in the context of loss of her husband in July, she has been hunching since relatively well.  Other psychosocial stressors includes chronic back pain.  She is planning to move in to her sisters, and is interested in having more social contact.  She forgot to lower the dose of lithium, although she is interested in trying this.  We will lower the dose of lithium to avoid polypharmacy.  She was advised to contact the office if any worsening in her symptoms.  Will continue quetiapine at this time to target bipolar disorder, which she also finds beneficial for her sleep.  Will continue venlafaxine to target depression and anxiety.  Will continue gabapentin for anxiety.  Will continue hydroxyzine as needed for anxiety.  Her clinical course is more consistent with bipolar 2 disorder; will continue to monitor.   This clinician has discussed the side effect associated with medication prescribed during this encounter. Please refer to notes in the previous encounters for more details.     Plan I have reviewed and updated  plans as below  1. Continue venlafaxine 225 mg daily 2. Decrease lithium 150 mg daily (was on 150 mg BID) 3. Continue quetiapine 25 mg at night- mild  rigidity on left arm 4. Continue gabapentin 300 mg twice a day 5. Continue hydroxyzine 10 mg three times a day 7. Next appointment: 1/12 at 1 PM, phone - on Cre 0.71, tsh 3.9 in May 2022, Lithium 0.7 in 04/2021    The patient demonstrates the following risk factors for suicide: Chronic risk factors for suicide include: psychiatric disorder of bipolar disorder and chronic pain. Acute risk factors for suicide include: unemployment. Protective factors for this patient include: positive social support, responsibility to others (children, family) and hope for the future. Considering these factors, the overall suicide risk at this point appears to be low. Patient is appropriate for outpatient follow up.      Norman Clay, MD 06/23/2021, 8:51 AM

## 2021-06-23 ENCOUNTER — Telehealth (INDEPENDENT_AMBULATORY_CARE_PROVIDER_SITE_OTHER): Payer: Medicare PPO | Admitting: Psychiatry

## 2021-06-23 ENCOUNTER — Encounter: Payer: Self-pay | Admitting: Psychiatry

## 2021-06-23 ENCOUNTER — Other Ambulatory Visit: Payer: Self-pay

## 2021-06-23 DIAGNOSIS — F411 Generalized anxiety disorder: Secondary | ICD-10-CM

## 2021-06-23 DIAGNOSIS — F3170 Bipolar disorder, currently in remission, most recent episode unspecified: Secondary | ICD-10-CM | POA: Diagnosis not present

## 2021-06-23 MED ORDER — LITHIUM CARBONATE 150 MG PO CAPS: 150.0000 mg | ORAL_CAPSULE | Freq: Every day | ORAL | 0 refills | Status: DC

## 2021-06-23 MED ORDER — QUETIAPINE FUMARATE 25 MG PO TABS: 25.0000 mg | ORAL_TABLET | Freq: Every day | ORAL | 0 refills | Status: DC

## 2021-06-23 MED ORDER — GABAPENTIN 300 MG PO CAPS: 300.0000 mg | ORAL_CAPSULE | Freq: Two times a day (BID) | ORAL | 1 refills | Status: DC

## 2021-06-23 NOTE — Patient Instructions (Signed)
1. Continue venlafaxine 225 mg daily 2. Decrease lithium 150 mg daily  3. Continue quetiapine 25 mg at night 4. Continue gabapentin 300 mg twice a day 5. Continue hydroxyzine 10 mg three times a day 7. Next appointment: 1/12 at 1 PM

## 2021-06-26 ENCOUNTER — Other Ambulatory Visit: Payer: Self-pay | Admitting: Family Medicine

## 2021-06-26 DIAGNOSIS — J432 Centrilobular emphysema: Secondary | ICD-10-CM

## 2021-06-26 DIAGNOSIS — R0609 Other forms of dyspnea: Secondary | ICD-10-CM

## 2021-06-26 NOTE — Telephone Encounter (Signed)
Requested Prescriptions  Pending Prescriptions Disp Refills  . SPIRIVA HANDIHALER 18 MCG inhalation capsule [Pharmacy Med Name: SPIRIVA 18 MCG CP-HANDIHALER] 90 capsule 1    Sig: Place 1 capsule (18 mcg total) into inhaler and inhale daily.     Pulmonology:  Anticholinergic Agents Passed - 06/26/2021  1:15 PM      Passed - Valid encounter within last 12 months    Recent Outpatient Visits          5 months ago Annual physical exam   [place], DO   11 months ago Bipolar disorder in remission [facility])   [place], DO   1 year ago Chronic right-sided low back pain with right-sided sciatica   [place], DO   2 years ago Centrilobular emphysema [facility])   [facility] Olin Hauser, DO   3 years ago Acute exacerbation of chronic obstructive pulmonary disease (COPD) ([place])   [facility] [employer], NP      Future Appointments            In 3 weeks [place], Devonne Doughty, DO [facility], [facility]   In 4 months  [facility], [facility]

## 2021-07-19 ENCOUNTER — Ambulatory Visit: Payer: Medicare PPO | Admitting: Family Medicine

## 2021-07-19 ENCOUNTER — Other Ambulatory Visit: Payer: Self-pay

## 2021-07-19 ENCOUNTER — Encounter: Payer: Self-pay | Admitting: Family Medicine

## 2021-07-19 VITALS — BP 138/70 | HR 99 | Ht <= 58 in | Wt 150.4 lb

## 2021-07-19 DIAGNOSIS — J432 Centrilobular emphysema: Secondary | ICD-10-CM | POA: Diagnosis not present

## 2021-07-19 DIAGNOSIS — F413 Other mixed anxiety disorders: Secondary | ICD-10-CM | POA: Diagnosis not present

## 2021-07-19 DIAGNOSIS — F411 Generalized anxiety disorder: Secondary | ICD-10-CM

## 2021-07-19 DIAGNOSIS — H8113 Benign paroxysmal vertigo, bilateral: Secondary | ICD-10-CM | POA: Diagnosis not present

## 2021-07-19 DIAGNOSIS — I700 Atherosclerosis of aorta: Secondary | ICD-10-CM | POA: Diagnosis not present

## 2021-07-19 DIAGNOSIS — Z23 Encounter for immunization: Secondary | ICD-10-CM | POA: Diagnosis not present

## 2021-07-19 DIAGNOSIS — F3170 Bipolar disorder, currently in remission, most recent episode unspecified: Secondary | ICD-10-CM | POA: Diagnosis not present

## 2021-07-19 MED ORDER — VENLAFAXINE HCL ER 75 MG PO CP24: ORAL_CAPSULE | ORAL | 3 refills | Status: DC

## 2021-07-19 MED ORDER — VENLAFAXINE HCL ER 150 MG PO CP24: ORAL_CAPSULE | ORAL | 3 refills | Status: AC

## 2021-07-19 MED ORDER — HYDROXYZINE HCL 10 MG PO TABS: 10.0000 mg | ORAL_TABLET | Freq: Three times a day (TID) | ORAL | 3 refills | Status: AC | PRN

## 2021-07-19 NOTE — Progress Notes (Signed)
Subjective:    Patient ID: Mackenzie Ward, female    DOB: December 02, 1945, 75 y.o.   MRN: [id number]  Mackenzie Ward is a 75 y.o. female presenting on 07/19/2021 for Dizziness (vertigo)   HPI  Aortic Atherosclerosis On LDCT imaging 11/2020 see below  Vertigo R Ear worse, with head movement position change quick episode of dizziness room spinning.  FOLLOW-UP COPD (Mild Emphysema) Previous visit for COPD follow-up, continued on Spiriva daily and had LDCT for Lung CA Screen, see prior notes for background information.  Last imaging 11/2020 - Today patient reports doing well. No recent flare. - She can tolerate regular activity without dyspnea. - Continues on Spiriva once daily most days, but skips some days when doesn't feel she needs it Admits weather change and allergy impact her breathing. - Rarely using Albuterol nebulizer once every 6 months except flare up - She has never seen Pulmonology or had PFT, and she is not interested at this time. Denies any worsening dyspnea, chest pain or tightness, productive cough, wheezing   Chronic Low Back Pain, OA/DJD / Sciatica R sided Chronic problem Associated symptoms RLS and RLE extremity pain and radiating nerve pain Affecting her gait at times. Gait - Taking Gabapentin 300mg  nightly for RLS helping her symptoms - bottom of feet, cold burn prickle occasionally She has temporary handicap placard, it will expire in 2020-10-29, she will request a new one for longer term after that time.       Bipolar Disorder in remission  Followed by [facility] Updates her mother passed away in October 29, 2020. She was visiting her in the nursing home with her sister, support system and husband.  On med management Venlafaxine 75mg  daily, Quetiapine 25mg  nightly, Lithium 150mg  BID, Hydroxyzine PRN  Followed by Dr Modesta Messing [facility] She was given refills on Gabapentin, Lithium, and Quetiapine last visit but needs refills on Venlafaxine and  Hydroxyzine. She has had difficulty following with their office, she will contact them and follow-up   Depression screen [facility] 2/9 06/23/2021 04/24/2021 01/16/2021  Decreased Interest 0 0 1  Down, Depressed, Hopeless 1 1 1   PHQ - 2 Score 1 1 2   Altered sleeping - - 1  Tired, decreased energy - - 3  Change in appetite - - 3  Feeling bad or failure about yourself  - - 1  Trouble concentrating - - 1  Moving slowly or fidgety/restless - - 0  Suicidal thoughts - - 0  PHQ-9 Score - - 11  Difficult doing work/chores - - Not difficult at all  Some recent data might be hidden    Social History   Tobacco Use   Smoking status: Every Day    Packs/day: 1.00    Years: 45.00    Pack years: 45.00    Types: Cigarettes   Smokeless tobacco: Never  Vaping Use   Vaping Use: Never used  Substance Use Topics   Alcohol use: No    Alcohol/week: 0.0 standard drinks   Drug use: No    Review of Systems Per HPI unless specifically indicated above     Objective:    BP 138/70   Pulse 99   Ht 4\' 10"  (1.473 m)   Wt 150 lb 6.4 oz (68.2 kg)   SpO2 98%   BMI 31.43 kg/m   Wt Readings from Last 3 Encounters:  07/19/21 150 lb 6.4 oz (68.2 kg)  04/24/21 148 lb 4.8 oz (67.3 kg)  01/16/21 146 lb 3.2 oz (  66.3 kg)    Physical Exam Vitals and nursing note reviewed.  Constitutional:      General: She is not in acute distress.    Appearance: She is well-developed. She is not diaphoretic.     Comments: Well-appearing, comfortable, cooperative  HENT:     Head: Normocephalic and atraumatic.  Eyes:     General:        Right eye: No discharge.        Left eye: No discharge.     Conjunctiva/sclera: Conjunctivae normal.  Neck:     Thyroid: No thyromegaly.  Cardiovascular:     Rate and Rhythm: Normal rate and regular rhythm.     Heart sounds: Normal heart sounds. No murmur heard. Pulmonary:     Effort: Pulmonary effort is normal. No respiratory distress.     Breath sounds: Normal breath sounds. No  wheezing or rales.  Musculoskeletal:        General: Normal range of motion.     Cervical back: Normal range of motion and neck supple.  Lymphadenopathy:     Cervical: No cervical adenopathy.  Skin:    General: Skin is warm and dry.     Findings: No erythema or rash.  Neurological:     Mental Status: She is alert and oriented to person, place, and time.  Psychiatric:        Behavior: Behavior normal.     Comments: Well groomed, good eye contact, normal speech and thoughts   Results for orders placed or performed in visit on 02/02/21  Lithium level  Result Value Ref Range   Lithium Lvl 0.7 0.5 - 1.2 mmol/L   I have personally reviewed the radiology report from 11/24/20 on LDCT.  Narrative & Impression  CLINICAL DATA:  75 year old asymptomatic female current smoker with 48 pack-year smoking history.   EXAM: CT CHEST WITHOUT CONTRAST LOW-DOSE FOR LUNG CANCER SCREENING   TECHNIQUE: Multidetector CT imaging of the chest was performed following the standard protocol without IV contrast.   COMPARISON:  11/16/2019 screening chest CT.   FINDINGS: Cardiovascular: Normal heart size. No significant pericardial effusion/thickening. Three-vessel coronary atherosclerosis. Atherosclerotic nonaneurysmal thoracic aorta. Normal caliber pulmonary arteries.   Mediastinum/Nodes: No discrete thyroid nodules. Unremarkable esophagus. No pathologically enlarged axillary, mediastinal or hilar lymph nodes, noting limited sensitivity for the detection of hilar adenopathy on this noncontrast study.   Lungs/Pleura: No pneumothorax. No pleural effusion. Mild centrilobular emphysema with mild diffuse bronchial wall thickening. No acute consolidative airspace disease or lung masses. No significant growth of previously visualized scattered pulmonary nodules. No new significant pulmonary nodules.   Upper abdomen: Small hiatal hernia.   Musculoskeletal: No aggressive appearing focal osseous  lesions. Moderate thoracic spondylosis.   IMPRESSION: 1. Lung-RADS 2, benign appearance or behavior. Continue annual screening with low-dose chest CT without contrast in 12 months. 2. Three-vessel coronary atherosclerosis. 3. Small hiatal hernia. 4. Aortic Atherosclerosis (ICD10-I70.0) and Emphysema (ICD10-J43.9).     Electronically Signed   By: Ilona Sorrel M.D.   On: 11/24/2020 21:06       Assessment & Plan:   Problem List Items Addressed This Visit     Other mixed anxiety disorders   Relevant Medications   venlafaxine XR (EFFEXOR-XR) 150 MG 24 hr capsule   venlafaxine XR (EFFEXOR-XR) 75 MG 24 hr capsule   hydrOXYzine (ATARAX/VISTARIL) 10 MG tablet   Centrilobular emphysema (HCC)   Bipolar disorder in remission (HCC)   Relevant Medications   venlafaxine XR (EFFEXOR-XR) 150 MG 24 hr  capsule   venlafaxine XR (EFFEXOR-XR) 75 MG 24 hr capsule   Other Visit Diagnoses     BPPV (benign paroxysmal positional vertigo), bilateral    -  Primary   Anxiety state       Relevant Medications   venlafaxine XR (EFFEXOR-XR) 150 MG 24 hr capsule   venlafaxine XR (EFFEXOR-XR) 75 MG 24 hr capsule   hydrOXYzine (ATARAX/VISTARIL) 10 MG tablet   Needs flu shot       Relevant Orders   Flu Vaccine QUAD High Dose(Fluad) (Completed)   Aortic atherosclerosis (HCC)           Aortic Atherosclerosis On LDCT imaging 11/2020 Offered statin therapy, she declines.  Bipolar in remission Anxiety Followed by [facility] Discussed today that patient will need to follow back up with Psychiatry for continued management on her med regimen including lithium. I cannot manage this but I am willing to order some of the meds she is missing including Venlafaxine and Hydroxyzine that I am comfortable prescribing if this is convenient for her as long as she continues to follow with psychiatry.  BPPV Chronic problem Episodic flares R>L Handout Epley Maneuever given  Emphysema Last [facility] 11/2020 On  Spiriva Consider switch to triple therapy Breztri if / when ready in future, defers for now   Meds ordered this encounter  Medications   venlafaxine XR (EFFEXOR-XR) 150 MG 24 hr capsule    Sig: Take total of 225 mg daily, along with 75 mg cap    Dispense:  90 capsule    Refill:  3   venlafaxine XR (EFFEXOR-XR) 75 MG 24 hr capsule    Sig: Take total of 225 mg daily, along with 75 mg cap    Dispense:  90 capsule    Refill:  3   hydrOXYzine (ATARAX/VISTARIL) 10 MG tablet    Sig: Take 1 tablet (10 mg total) by mouth 3 (three) times daily as needed for anxiety.    Dispense:  270 tablet    Refill:  3      Follow up plan: Return in about 6 months (around 01/16/2022) for 6 month fasting lab only then 1 week later Annual Physical.   Nobie Putnam, DO [facility] 07/19/2021, 10:53 AM

## 2021-07-19 NOTE — Patient Instructions (Addendum)
Thank you for coming to the office today.  [facility] ([facility]) Address: [address], [place],  [address] Hours: 8:30AM-5PM Phone: [phone]  Contact them to share concerns and see if other provider is available.  I will re order the Venlafaxine and Hydroxyzine  1. You have symptoms of Vertigo (Benign Paroxysmal Positional Vertigo) - This is commonly caused by inner ear fluid imbalance, sometimes can be worsened by allergies and sinus symptoms, otherwise it can occur randomly sometimes and we may never discover the exact cause. - To treat this, try the Epley Manuever (see diagrams/instructions below) at home up to 3 times a day for 1-2 weeks or until symptoms resolve  If you develop significant worsening episode with vertigo that does not improve and you get severe headache, loss of vision, arm or leg weakness, slurred speech, or other concerning symptoms please seek immediate medical attention at Emergency Department.  Please schedule a follow-up appointment with Dr Parks Ranger within 4 weeks if Vertigo not improving, and will consider Referral to Vestibular Rehab  See the next page for images describing the Epley Manuever.     ----------------------------------------------------------------------------------------------------------------------        Please schedule a Follow-up Appointment to: Return in about 6 months (around 01/16/2022) for 6 month fasting lab only then 1 week later Annual Physical.  If you have any other questions or concerns, please feel free to call the office or send a message through [facility]. You may also schedule an earlier appointment if necessary.  Additionally, you may be receiving a survey about your experience at our office within a few days to 1 week by e-mail or mail. We value your feedback.  Nobie Putnam, DO [place]

## 2021-09-20 NOTE — Progress Notes (Signed)
Virtual Visit via Telephone Note  I connected with Mackenzie Ward on 09/21/21 at  1:00 PM EST by telephone and verified that I am speaking with the correct person using two identifiers.  Location: Patient: home Provider: office Persons participated in the visit- patient, provider    I discussed the limitations, risks, security and privacy concerns of performing an evaluation and management service by telephone and the availability of in person appointments. I also discussed with the patient that there may be a patient responsible charge related to this service. The patient expressed understanding and agreed to proceed.    I discussed the assessment and treatment plan with the patient. The patient was provided an opportunity to ask questions and all were answered. The patient agreed with the plan and demonstrated an understanding of the instructions.   The patient was advised to call back or seek an in-person evaluation if the symptoms worsen or if the condition fails to improve as anticipated.  I provided 21 minutes of non-face-to-face time during this encounter.   Norman Clay, MD    [facility] MD/PA/NP OP Progress Note  09/21/2021 2:22 PM [name]  MRN:  [id number]  Chief Complaint:  Chief Complaint   Follow-up; Other    HPI:  This is a follow-up appointment for bipolar disorder and an anxiety.  Mackenzie Ward states that Mackenzie Ward has been feeling overwhelmed.  Mackenzie Ward is trying to move to her sister.  Mackenzie Ward states that Mackenzie Ward was living at the current place with her husband for 40 years.  Mackenzie Ward feels like Mackenzie Ward is living memory behind.  Mackenzie Ward feels like starting a new chapter of life.  Although Mackenzie Ward is somewhere looking forward to it, Mackenzie Ward feels dread and sad about this.  Mackenzie Ward notices that Mackenzie Ward has been more emotional.  Mackenzie Ward has been joining [place] group with other people who lost their significant others.  Mackenzie Ward feels down at times and feels more anxious lately.  Mackenzie Ward denies panic attacks.  Mackenzie Ward sleeps well.   Mackenzie Ward denies change in weight or appetite.  Mackenzie Ward denies SI.  Mackenzie Ward denies decreased need for sleep or euphonia.  Mackenzie Ward states that Mackenzie Ward ran out lithium in December.  Although Mackenzie Ward reportedly contacted our office.  Mackenzie Ward has not heard back from anybody.  Mackenzie Ward has not noticed any change since not taking lithium, and feels comfortable to stay off from this medication.  Mackenzie Ward denies decreased need for sleep or euphonia.   Daily routine: household chores,  Exercise: Employment: Retired in 2010. 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 Married twice before Number of children: 0 Mackenzie Ward grew up in [place]. Mackenzie Ward had "good childhood." Mackenzie Ward lost her mother in 2020-11-13. Her father deceased in 60.   Visit Diagnosis:    ICD-10-CM   1. Bipolar affective disorder, currently depressed, mild ([place])  F31.31     2. Anxiety state  F41.1       Past Psychiatric History: Please see initial evaluation for full details. I have reviewed the history. No updates at this time.     Past Medical History:  Past Medical History:  Diagnosis Date   Asthma    COPD (chronic obstructive pulmonary disease) (HCC)    Depression    High cholesterol  Past Surgical History:  Procedure Laterality Date   BREAST SURGERY Right 2013   CATARACT EXTRACTION W/PHACO Left 08/26/2019   Procedure: CATARACT EXTRACTION PHACO AND INTRAOCULAR LENS PLACEMENT (IOC) LEFT  4.45    00:40.5    11.0%;  Surgeon: Leandrew Koyanagi, MD;  Location: [facility];  Service: Ophthalmology;  Laterality: Left;   CATARACT EXTRACTION W/PHACO Right 09/30/2019   Procedure: CATARACT EXTRACTION PHACO AND INTRAOCULAR LENS PLACEMENT (IOC) RIGHT 4.05  00:44.4  9.1% ;  Surgeon: Leandrew Koyanagi, MD;  Location: [facility];  Service: Ophthalmology;  Laterality: Right;   COLONOSCOPY  15 years ago   COLONOSCOPY WITH PROPOFOL N/A 11/10/2018   Procedure: COLONOSCOPY WITH PROPOFOL;  Surgeon: Lin Landsman, MD;  Location: [facility];  Service: Gastroenterology;  Laterality: N/A;   EYE SURGERY     TONSILLECTOMY      Family Psychiatric History: Please see initial evaluation for full details. I have reviewed the history. No updates at this time.     Family History:  Family History  Problem Relation Age of Onset   Lymphoma Mother        Non Hodgkin   Dementia Mother    Alzheimer's disease Mother    Lymphoma Sister        Non Hodgkin   Depression Sister    Heart attack Father    Alcohol abuse Father    Dementia Brother    Dementia Maternal Uncle    Alzheimer's disease Maternal Aunt    Alzheimer's disease Paternal Aunt    Alzheimer's disease Paternal Grandmother    Alzheimer's disease Maternal Aunt    Alzheimer's disease Paternal Aunt     Social History:  Social History   Socioeconomic History   Marital status: Widowed    Spouse name: Not on file   Number of children: Not on file   Years of education: Not on file   Highest education level: Some college, no degree  Occupational History   Occupation: retired  Tobacco Use   Smoking status: Every Day    Packs/day: 1.00    Years: 45.00    Pack years: 45.00    Types: Cigarettes   Smokeless tobacco: Never  Vaping Use   Vaping Use: Never used  Substance and Sexual Activity   Alcohol use: No    Alcohol/week: 0.0 standard drinks   Drug use: No   Sexual activity: Not Currently  Other Topics Concern   Not on file  Social History Narrative   Goes to dinner with friends often, goes to see her mom at nursing home on a regular basis    Social Determinants of Health   Financial Resource Strain: Low Risk    Difficulty of Paying Living Expenses: Not hard at all  Food Insecurity: No Food Insecurity   Worried About Sales executive in the Last Year: Never true   Arboriculturist in the Last Year: Never true  Transportation Needs: No Transportation Needs   Lack of Transportation (Medical): No   Lack of Transportation (Non-Medical): No  Physical Activity: Inactive   Days of Exercise per Week: 0 days   Minutes of Exercise per Session: 0 min  Stress: No Stress Concern Present   Feeling of Stress : Not at all  Social Connections: Not on file    Allergies: No Known Allergies  Metabolic Disorder Labs: Lab Results  Component Value Date   HGBA1C 5.3 01/09/2021   MPG 105 01/09/2021   MPG 111  11/27/2018   No results found for: PROLACTIN Lab Results  Component Value Date   CHOL 242 (H) 01/09/2021   TRIG 177 (H) 01/09/2021   HDL 50 01/09/2021   CHOLHDL 4.8 01/09/2021   VLDL 27 11/05/2016   LDLCALC 159 (H) 01/09/2021   LDLCALC 178 (H) 11/27/2018   Lab Results  Component Value Date   TSH 3.98 01/09/2021   TSH 4.47 11/27/2018    Therapeutic Level Labs: Lab Results  Component Value Date   LITHIUM 0.7 04/13/2021   No results found for: VALPROATE No components found for:  CBMZ  Current Medications: Current Outpatient Medications  Medication Sig Dispense Refill   albuterol (PROVENTIL) (2.5 MG/3ML) 0.083% nebulizer solution Take 3 mLs (2.5 mg total) by nebulization every 6 (six) hours as needed for wheezing or shortness of breath. 60 mL 1   gabapentin (NEURONTIN) 300 MG capsule Take 1 capsule (300 mg total) by mouth 2 (two) times daily. 180 capsule 1   hydrOXYzine (ATARAX/VISTARIL) 10 MG tablet Take 1 tablet (10 mg total) by mouth 3 (three) times daily as needed for anxiety. 270 tablet 3   lithium carbonate 150 MG capsule Take 1 capsule (150 mg total) by mouth at bedtime. 90 capsule 0   Nutritional Supplements (VITAMIN D BOOSTER PO) Take by mouth.     QUEtiapine (SEROQUEL) 25 MG tablet Take 1 tablet (25 mg total) by mouth at bedtime. 90 tablet 0   SPIRIVA HANDIHALER 18 MCG inhalation capsule Place 1  capsule (18 mcg total) into inhaler and inhale daily. 90 capsule 1   venlafaxine XR (EFFEXOR-XR) 150 MG 24 hr capsule Take total of 225 mg daily, along with 75 mg cap 90 capsule 3   venlafaxine XR (EFFEXOR-XR) 75 MG 24 hr capsule Take total of 225 mg daily, along with 75 mg cap 90 capsule 3   No current facility-administered medications for this visit.     Musculoskeletal: Strength & Muscle Tone:  N/A Gait & Station:  N/A Patient leans: N/A  Psychiatric Specialty Exam: Review of Systems  Psychiatric/Behavioral:  Positive for dysphoric mood. Negative for agitation, behavioral problems, confusion, decreased concentration, hallucinations, self-injury, sleep disturbance and suicidal ideas. The patient is nervous/anxious. The patient is not hyperactive.   All other systems reviewed and are negative.  There were no vitals taken for this visit.There is no height or weight on file to calculate BMI.  General Appearance: NA  Eye Contact:  NA  Speech:  Clear and Coherent  Volume:  Normal  Mood:   not good  Affect:  NA  Thought Process:  Coherent  Orientation:  Full (Time, Place, and Person)  Thought Content: Logical   Suicidal Thoughts:  No  Homicidal Thoughts:  No  Memory:  Immediate;   Good  Judgement:  Good  Insight:  Good  Psychomotor Activity:  Normal  Concentration:  Concentration: Good and Attention Span: Good  Recall:  Good  Fund of Knowledge: Good  Language: Good  Akathisia:  No  Handed:  Right  AIMS (if indicated): not done  Assets:  Communication Skills Desire for Improvement  ADL's:  Intact  Cognition: WNL  Sleep:  Fair   Screenings: GAD-7    Flowsheet Row Office Visit from 01/07/2020 in [facility]  Total GAD-7 Score 3      PHQ2-9    Flowsheet Row Video Visit from 06/23/2021 in [facility] Office Visit from 04/24/2021 in [facility] Office Visit from 01/16/2021 in [place]  [place] from [facility]/08/2020 in [facility] Video Visit from 11/07/2020 in [facility]  PHQ-2 Total Score 1 1 2 3 2   PHQ-9 Total Score -- -- 11 10 4       Flowsheet Row Video Visit from 06/23/2021 in [facility] Office Visit from 04/24/2021 in [facility] Video Visit from 11/07/2020 in [facility] No Risk No Risk No Risk        Assessment and Plan:  ARIYONNA TWICHELL is a 76 y.o. year old female with a history of bipolar disorder, COPD, hyperlipidemia, chronic low back pain, who presents for follow up appointment for below.   1. Anxiety state 2. Bipolar disorder current episode depressed Mackenzie Ward reports slight worsening in depressive symptoms and an anxiety in the context of upcoming relocation since loss of her husband in July. Other psychosocial stressors includes chronic back pain.  Will uptitrate gabapentin to optimize treatment for anxiety and then also for pain.  Will continue venlafaxine to target depression and anxiety.  Will continue quetiapine given her strong preference, given it has been beneficial for her sleep and also to target bipolar disorder.  Will continue hydroxyzine as needed for anxiety.  Mackenzie Ward has not noticed any difference since discontinuation of lithium. Her clinical course is more consistent with bipolar 2 disorder; will continue to monitor.   This clinician has discussed the side effect associated with medication prescribed during this encounter. Please refer to notes in the previous encounters for more details.    Plan  Continue venlafaxine 225 mg daily Hold lithium Continue quetiapine 25 mg at night- mild  rigidity on left arm Increase gabapentin 300 mg three times a day (was on BID) Continue hydroxyzine 10 mg three times a day Next appointment: 2/16 at 10 AM for 30 mins, in person  [email or web address] -  on Cre 0.71, tsh 3.9 in May 2022, Lithium 0.7 in 04/2021    The patient demonstrates the following risk factors for suicide: Chronic risk factors for suicide include: psychiatric disorder of bipolar disorder and chronic pain. Acute risk factors for suicide include: unemployment. Protective factors for this patient include: positive social support, responsibility to others (children, family) and hope for the future. Considering these factors, the overall suicide risk at this point appears to be low. Patient is appropriate for outpatient follow up.      Norman Clay, MD 09/21/2021, 2:22 PM

## 2021-09-21 ENCOUNTER — Telehealth (INDEPENDENT_AMBULATORY_CARE_PROVIDER_SITE_OTHER): Payer: Medicare PPO | Admitting: Psychiatry

## 2021-09-21 ENCOUNTER — Encounter: Payer: Self-pay | Admitting: Psychiatry

## 2021-09-21 ENCOUNTER — Other Ambulatory Visit: Payer: Self-pay

## 2021-09-21 DIAGNOSIS — F411 Generalized anxiety disorder: Secondary | ICD-10-CM

## 2021-09-21 DIAGNOSIS — F3131 Bipolar disorder, current episode depressed, mild: Secondary | ICD-10-CM | POA: Diagnosis not present

## 2021-09-21 NOTE — Patient Instructions (Addendum)
Continue venlafaxine 225 mg daily Hold lithium Continue quetiapine 25 mg at night Increase gabapentin 300 mg three times a day  Continue hydroxyzine 10 mg three times a day Next appointment: 2/16 at 10 AM for 30 mins, in person  The next visit will be in person visit. Please arrive 15 mins before the scheduled time.   [facility]  Address: [address], [place], [place] [address]

## 2021-09-28 ENCOUNTER — Other Ambulatory Visit: Payer: Self-pay | Admitting: Psychiatry

## 2021-09-28 DIAGNOSIS — F411 Generalized anxiety disorder: Secondary | ICD-10-CM

## 2021-10-05 IMAGING — CT CT CHEST LUNG CANCER SCREENING LOW DOSE W/O CM
2 of 5 series · 15 of 40 positions shown, 18 images · non-contrast
Comparison: 11/14/2018 screening chest CT.

CLINICAL DATA: 73-year-old asymptomatic female current smoker with
47 pack-year smoking history.

EXAM:
CT CHEST WITHOUT CONTRAST LOW-DOSE FOR LUNG CANCER SCREENING
TECHNIQUE: Multidetector CT imaging of the chest was performed following the
standard protocol without IV contrast.

[Series 3: lung 1.00 · axial · 0.61mm/px · z∈[-1195,-908]mm · 12 of 319 slices shown, 15 images]
[im 16/319  mediastinal]
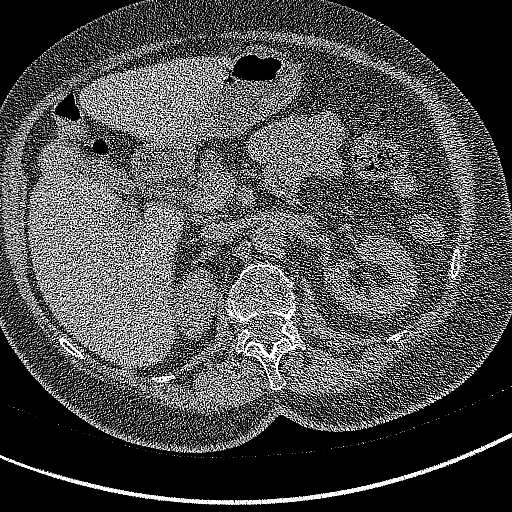
[im 16/319  lung]
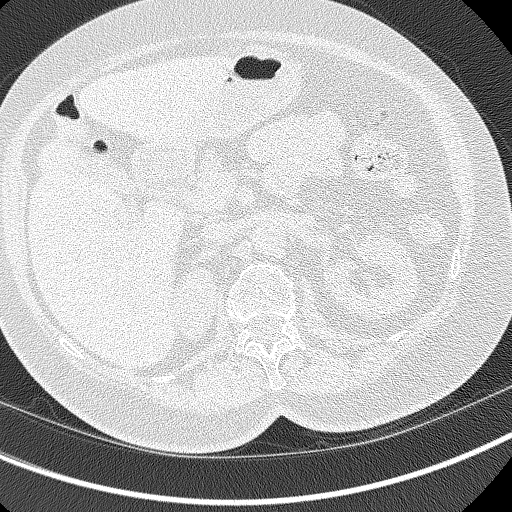
[im 46/319  lung]
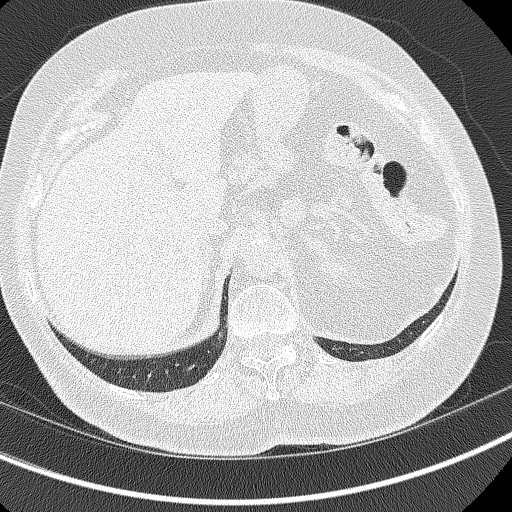
[im 76/319  lung]
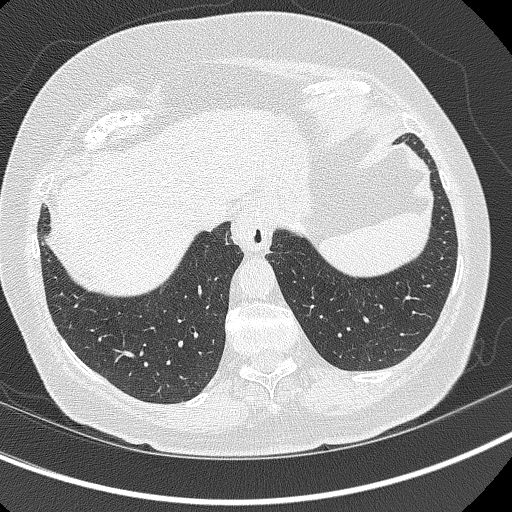
[im 91/319  lung]
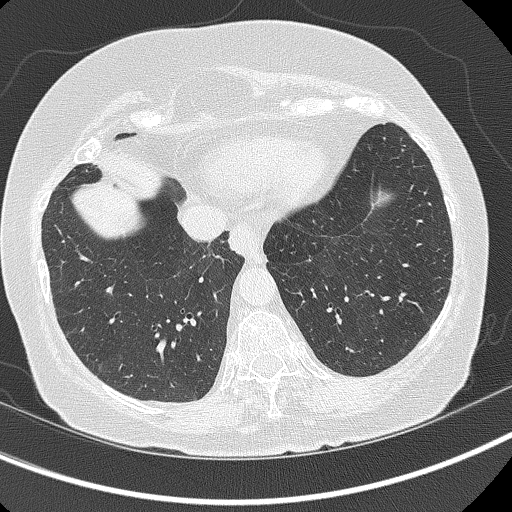
[im 122/319  mediastinal]
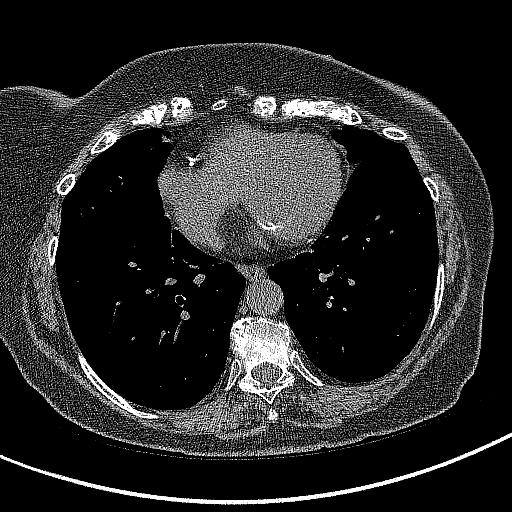
[im 122/319  lung]
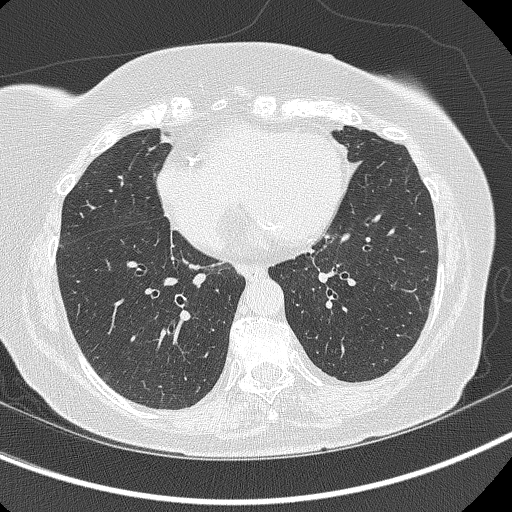
[im 152/319  lung]
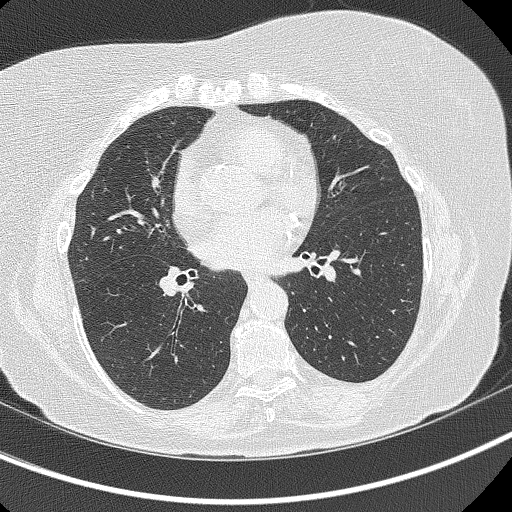
[im 167/319  lung]
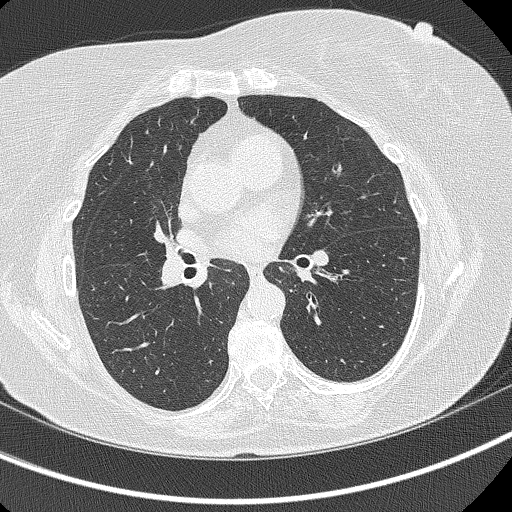
[im 197/319  lung]
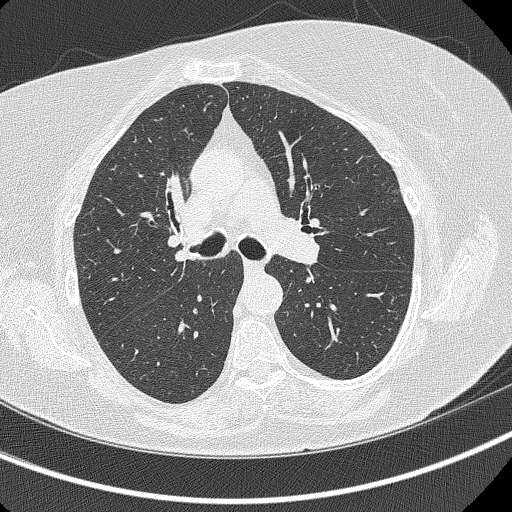
[im 228/319  mediastinal]
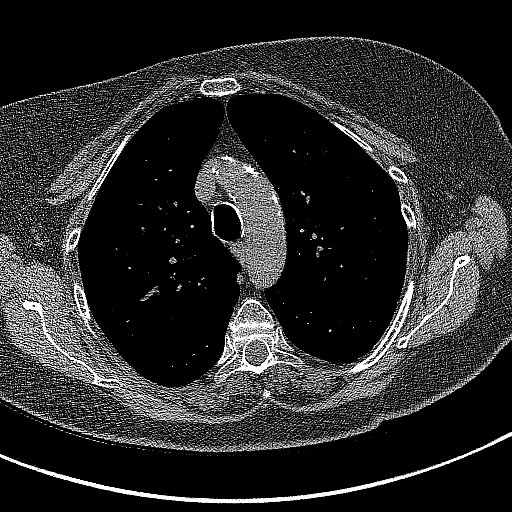
[im 228/319  lung]
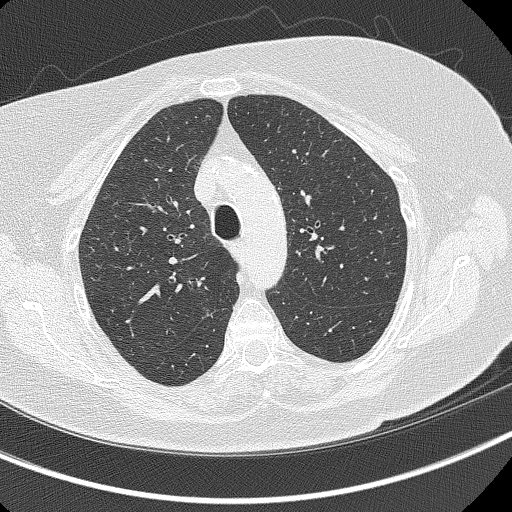
[im 243/319  lung]
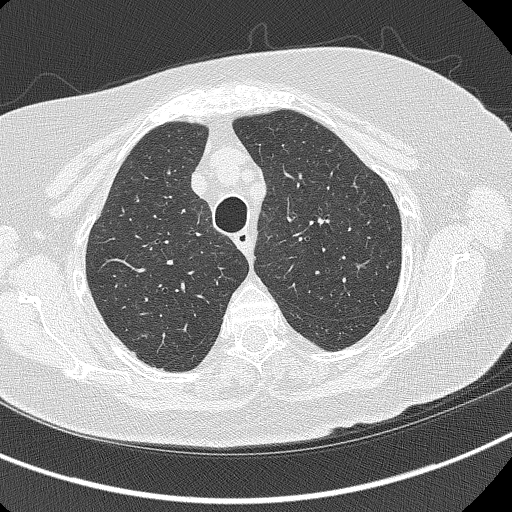
[im 273/319  lung]
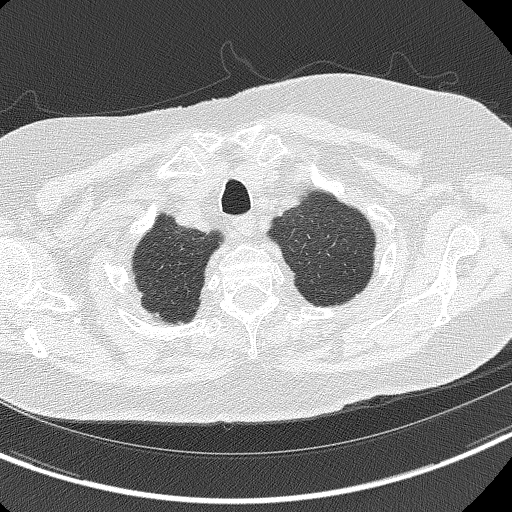
[im 303/319  lung]
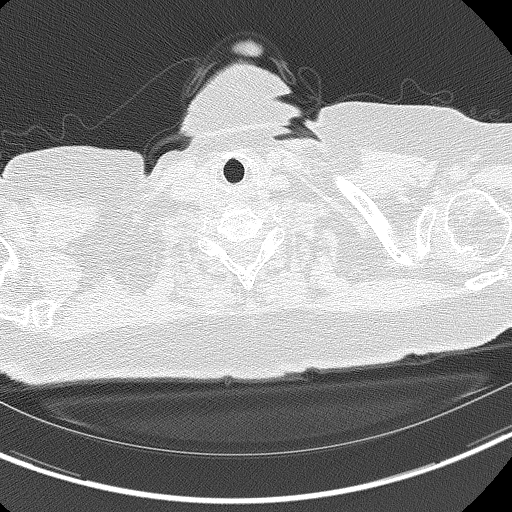

[Series 4: coronals lung 1.00 cor · coronal · 0.61mm/px · 3 of 288 slices shown]
[im 58/288  lung]
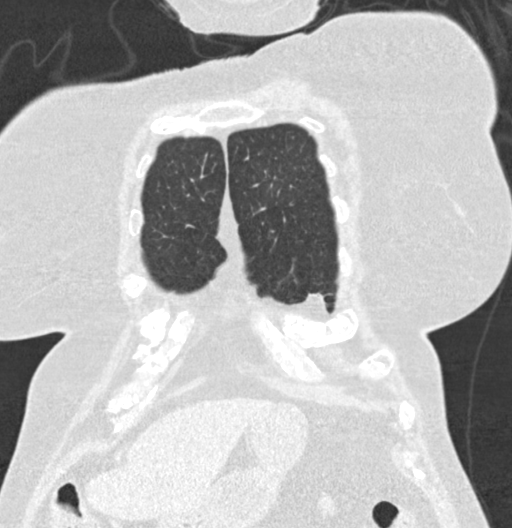
[im 115/288  lung]
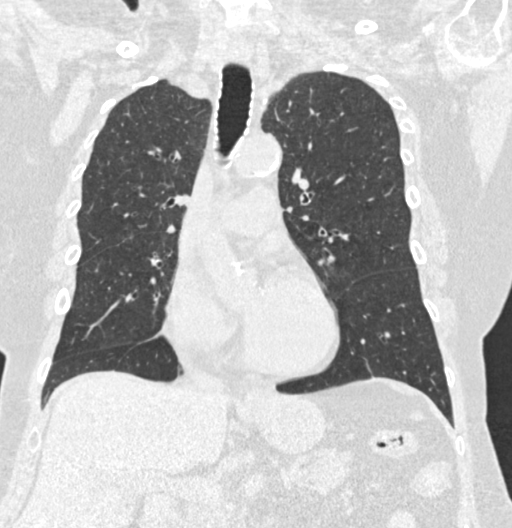
[im 173/288  lung]
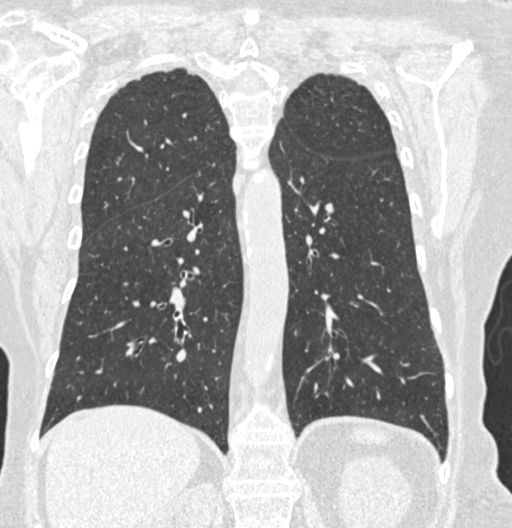

[15 of 40 positions shown; findings below may reference images not displayed]

FINDINGS: Cardiovascular: Normal heart size. No significant pericardial
effusion/thickening. Three-vessel coronary atherosclerosis.
Atherosclerotic nonaneurysmal thoracic aorta. Normal caliber
pulmonary arteries.

Mediastinum/Nodes: No discrete thyroid nodules. Unremarkable
esophagus. No pathologically enlarged axillary, mediastinal or hilar
lymph nodes, noting limited sensitivity for the detection of hilar
adenopathy on this noncontrast study.

Lungs/Pleura: No pneumothorax. No pleural effusion. Mild
centrilobular emphysema. No acute consolidative airspace disease or
lung masses. No significant growth of previously visualized
scattered small pulmonary nodules. No new significant pulmonary
nodules. Layering mucoid debris in central lower right airways.

Upper abdomen: Small hiatal hernia.

Musculoskeletal: No aggressive appearing focal osseous lesions.
Moderate thoracic spondylosis.
IMPRESSION: 1. Lung-RADS 2, benign appearance or behavior. Continue annual
screening with low-dose chest CT without contrast in 12 months.
2. Three-vessel coronary atherosclerosis.
3. Small hiatal hernia.
4. Aortic Atherosclerosis ([id number]-JPC.C) and Emphysema ([id number].Q).

## 2021-10-23 NOTE — Progress Notes (Signed)
[facility] MD/PA/NP OP Progress Note  2021/11/10 11:14 AM Mackenzie Ward  MRN:  [id number]  Chief Complaint:  Chief Complaint   Follow-up; Other    HPI:  This is a follow-up appointment for depression and anxiety.  She states that she has been better.  She moved to her sister's place.  They have been working on things at the house.  She reports good relationship with her sister.  Although she still misses her husband, she has been doing well.  She has depressive symptoms as in PHQ-9.  She denies SI.  She has occasional increase in appetite; she is willing to switch from quetiapine to Abilify at this time.    Bipolar disorder-she states that she always has issues with her "mouth."  She has been offensive to people from childhood to even after she was retired.  She states that she was trying to get attention, referring to her childhood diagnosis of asthma.  Although there were times she feels super energized, she adamantly denies any periods of decreased need for sleep.  She states that she hates trauma, and has been able to leave the situation when there is a conflict.  She denies alcohol use or drug use.  She denies irritability.   Daily routine: household chores,  Exercise: Employment: Retired in 2010. 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 Married twice before Number of children: 0 She grew up in [place]. She had "good childhood." She lost her mother in 11/10/20. Her father deceased in 20.  Wt Readings from Last 3 Encounters:  2021/11/10 156 lb (70.8 kg)  07/19/21 150 lb 6.4 oz (68.2 kg)  04/24/21 148 lb 4.8 oz (67.3 kg)    Visit Diagnosis:    ICD-10-CM   1. MDD (major depressive disorder), recurrent episode, moderate (HCC)  F33.1      2. Anxiety state  F41.1       Past Psychiatric History: Please see initial evaluation for full details. I have reviewed the history. No updates at this time.     Past Medical History:  Past Medical History:  Diagnosis Date   Asthma    COPD (chronic obstructive pulmonary disease) ([place])    Depression    High cholesterol     Past Surgical History:  Procedure Laterality Date   BREAST SURGERY Right 2013   CATARACT EXTRACTION W/PHACO Left 08/26/2019   Procedure: CATARACT EXTRACTION PHACO AND INTRAOCULAR LENS PLACEMENT (IOC) LEFT  4.45    00:40.5    11.0%;  Surgeon: Leandrew Koyanagi, MD;  Location: [facility];  Service: Ophthalmology;  Laterality: Left;   CATARACT EXTRACTION W/PHACO Right 09/30/2019   Procedure: CATARACT EXTRACTION PHACO AND INTRAOCULAR LENS PLACEMENT (IOC) RIGHT 4.05  00:44.4  9.1% ;  Surgeon: Leandrew Koyanagi, MD;  Location: [facility];  Service: Ophthalmology;  Laterality: Right;   COLONOSCOPY  15 years ago   COLONOSCOPY WITH PROPOFOL N/A 11/10/2018   Procedure: COLONOSCOPY WITH PROPOFOL;  Surgeon: Lin Landsman, MD;  Location: [facility];  Service: Gastroenterology;  Laterality: N/A;   EYE SURGERY     TONSILLECTOMY      Family Psychiatric History: Please see initial evaluation for full details. I have reviewed the history. No updates at this time.  Family History:  Family History  Problem Relation Age of Onset   Lymphoma Mother        Non Hodgkin   Dementia Mother    Alzheimer's disease Mother    Lymphoma Sister        Non Hodgkin   Depression Sister    Heart attack Father    Alcohol abuse Father    Dementia Brother    Dementia Maternal Uncle    Alzheimer's disease Maternal Aunt    Alzheimer's disease Paternal Aunt    Alzheimer's disease Paternal Grandmother    Alzheimer's disease Maternal Aunt    Alzheimer's disease Paternal Aunt     Social History:  Social History   Socioeconomic History   Marital  status: Widowed    Spouse name: Not on file   Number of children: Not on file   Years of education: Not on file   Highest education level: Some college, no degree  Occupational History   Occupation: retired  Tobacco Use   Smoking status: Every Day    Packs/day: 1.00    Years: 45.00    Pack years: 45.00    Types: Cigarettes   Smokeless tobacco: Never  Vaping Use   Vaping Use: Never used  Substance and Sexual Activity   Alcohol use: No    Alcohol/week: 0.0 standard drinks   Drug use: No   Sexual activity: Not Currently  Other Topics Concern   Not on file  Social History Narrative   Goes to dinner with friends often, goes to see her mom at nursing home on a regular basis    Social Determinants of Health   Financial Resource Strain: Low Risk    Difficulty of Paying Living Expenses: Not hard at all  Food Insecurity: No Food Insecurity   Worried About Charity fundraiser in the Last Year: Never true   Arboriculturist in the Last Year: Never true  Transportation Needs: No Transportation Needs   Lack of Transportation (Medical): No   Lack of Transportation (Non-Medical): No  Physical Activity: Inactive   Days of Exercise per Week: 0 days   Minutes of Exercise per Session: 0 min  Stress: No Stress Concern Present   Feeling of Stress : Not at all  Social Connections: Not on file    Allergies: No Known Allergies  Metabolic Disorder Labs: Lab Results  Component Value Date   HGBA1C 5.3 01/09/2021   MPG 105 01/09/2021   MPG 111 11/27/2018   No results found for: PROLACTIN Lab Results  Component Value Date   CHOL 242 (H) 01/09/2021   TRIG 177 (H) 01/09/2021   HDL 50 01/09/2021   CHOLHDL 4.8 01/09/2021   VLDL 27 11/05/2016   LDLCALC 159 (H) 01/09/2021   LDLCALC 178 (H) 11/27/2018   Lab Results  Component Value Date   TSH 3.98 01/09/2021   TSH 4.47 11/27/2018    Therapeutic Level Labs: Lab Results  Component Value Date   LITHIUM 0.7 04/13/2021   No results  found for: VALPROATE No components found for:  CBMZ  Current Medications: Current Outpatient Medications  Medication Sig Dispense Refill   albuterol (PROVENTIL) (2.5 MG/3ML) 0.083% nebulizer solution Take 3 mLs (2.5 mg total) by nebulization every 6 (six) hours as needed for wheezing or shortness of breath. 60 mL 1   ARIPiprazole (ABILIFY) 2 MG tablet Take 1 tablet (2 mg total) by mouth daily. 30 tablet 0   gabapentin (NEURONTIN) 300 MG capsule Take 1 capsule (  300 mg total) by mouth 2 (two) times daily. 180 capsule 1   hydrOXYzine (ATARAX/VISTARIL) 10 MG tablet Take 1 tablet (10 mg total) by mouth 3 (three) times daily as needed for anxiety. 270 tablet 3   SPIRIVA HANDIHALER 18 MCG inhalation capsule Place 1 capsule (18 mcg total) into inhaler and inhale daily. 90 capsule 1   venlafaxine XR (EFFEXOR-XR) 150 MG 24 hr capsule Take total of 225 mg daily, along with 75 mg cap 90 capsule 3   venlafaxine XR (EFFEXOR-XR) 75 MG 24 hr capsule Take total of 225 mg daily, along with 75 mg cap 90 capsule 3   Nutritional Supplements (VITAMIN D BOOSTER PO) Take by mouth. (Patient not taking: Reported on 10/26/2021)     No current facility-administered medications for this visit.     Musculoskeletal: Strength & Muscle Tone:  normal Gait & Station:  using a cane Patient leans: N/A  Psychiatric Specialty Exam: Review of Systems  Psychiatric/Behavioral:  Positive for decreased concentration, dysphoric mood and sleep disturbance. Negative for agitation, behavioral problems, confusion, hallucinations, self-injury and suicidal ideas. The patient is nervous/anxious. The patient is not hyperactive.   All other systems reviewed and are negative.  Blood pressure (!) 157/78, pulse 100, temperature 98.5 F (36.9 C), height 4' 10.25" (1.48 m), weight 156 lb (70.8 kg), SpO2 94 %.Body mass index is 32.32 kg/m.  General Appearance: Fairly Groomed  Eye Contact:  Good  Speech:  Clear and Coherent  Volume:  Normal   Mood:   good  Affect:  Appropriate, Congruent, and calm  Thought Process:  Coherent  Orientation:  Full (Time, Place, and Person)  Thought Content: Logical   Suicidal Thoughts:  No  Homicidal Thoughts:  No  Memory:  Immediate;   Good  Judgement:  Good  Insight:  Good  Psychomotor Activity:  Normal no rigiidity, postural tremors on bilateral hands. No resting tremors  Concentration:  Concentration: Good and Attention Span: Good  Recall:  Good  Fund of Knowledge: Good  Language: Good  Akathisia:  No  Handed:  Right  AIMS (if indicated): 0   Assets:  Communication Skills Desire for Improvement  ADL's:  Intact  Cognition: WNL  Sleep:  Fair   Screenings: GAD-7    Flowsheet Row Office Visit from 01/07/2020 in [facility]  Total GAD-7 Score 3      PHQ2-9    [place] Visit from 10/26/2021 in [place] Video Visit from 06/23/2021 in [facility] Office Visit from 04/24/2021 in [facility] Office Visit from 01/16/2021 in [facility] from 11/08/2020 in [facility]  PHQ-2 Total Score 3 1 1 2 3   PHQ-9 Total Score 15 -- -- 11 10      [place] Office Visit from 10/26/2021 in [facility] Video Visit from 06/23/2021 in [facility] Office Visit from 04/24/2021 in [facility] No Risk No Risk No Risk        Assessment and Plan:  JALIYAH FOTHERINGHAM is a 76 y.o. year old female with a history of depression, COPD, hyperlipidemia, chronic low back pain, who presents for follow up appointment for below.    1. MDD (major depressive disorder), recurrent episode, moderate ([place]) 2. Anxiety state R/o bipolar II disorder She continues to report depressive symptoms, although there has been overall improvement since relocation to  her sister.  Psychosocial  stressors includes loss of her husband in July, chronic pain.  Will switch from quetiapine to Abilify to mitigate risk of metabolic side effect and also to see if it would be more beneficial for her depressive symptoms.  Discussed potential metabolic side effect , drowsiness , and EPS.  Will continue venlafaxine to target depression and anxiety while monitoring hypertension.  Will continue gabapentin for anxiety/pain.  Will continue hydroxyzine as needed for anxiety.   Plan   Continue venlafaxine 225 mg daily Discontinue quetiapine (was on 25 mg) Start Abilify 2 mg at night  Continue gabapentin 300 mg three times a day  Continue hydroxyzine 10 mg three times a day Next appointment: 23/17 at 10:30 for 30 mins, video [email or web address]    The patient demonstrates the following risk factors for suicide: Chronic risk factors for suicide include: psychiatric disorder of bipolar disorder and chronic pain. Acute risk factors for suicide include: unemployment. Protective factors for this patient include: positive social support, responsibility to others (children, family) and hope for the future. Considering these factors, the overall suicide risk at this point appears to be low. Patient is appropriate for outpatient follow up.   Collaboration of Care: Other she sees her PCP  Patient/Guardian was advised Release of Information must be obtained prior to any record release in order to collaborate their care with an outside provider. Patient/Guardian was advised if they have not already done so to contact the registration department to sign all necessary forms in order for [place] to release information regarding their care.   Consent: Patient/Guardian gives verbal consent for treatment and assignment of benefits for services provided during this visit. Patient/Guardian expressed understanding and agreed to proceed.      Norman Clay, MD 10/26/2021, 11:14 AM

## 2021-10-26 ENCOUNTER — Encounter: Payer: Self-pay | Admitting: Psychiatry

## 2021-10-26 ENCOUNTER — Other Ambulatory Visit: Payer: Self-pay

## 2021-10-26 ENCOUNTER — Ambulatory Visit (INDEPENDENT_AMBULATORY_CARE_PROVIDER_SITE_OTHER): Payer: Medicare PPO | Admitting: Psychiatry

## 2021-10-26 VITALS — BP 157/78 | HR 100 | Temp 98.5°F | Ht 58.25 in | Wt 156.0 lb

## 2021-10-26 DIAGNOSIS — F411 Generalized anxiety disorder: Secondary | ICD-10-CM

## 2021-10-26 DIAGNOSIS — F331 Major depressive disorder, recurrent, moderate: Secondary | ICD-10-CM

## 2021-10-26 MED ORDER — ARIPIPRAZOLE 2 MG PO TABS: 2.0000 mg | ORAL_TABLET | Freq: Every day | ORAL | 0 refills | Status: DC

## 2021-10-26 NOTE — Patient Instructions (Signed)
Continue venlafaxine 225 mg daily Discontinue quetiapine  Start Abilify 2 mg at night  Continue gabapentin 300 mg three times a day  Continue hydroxyzine 10 mg three times a day Next appointment: 23/17 at 10:30

## 2021-11-14 ENCOUNTER — Ambulatory Visit: Payer: Medicare PPO

## 2021-11-16 ENCOUNTER — Ambulatory Visit (INDEPENDENT_AMBULATORY_CARE_PROVIDER_SITE_OTHER): Payer: Medicare PPO

## 2021-11-16 ENCOUNTER — Other Ambulatory Visit: Payer: Self-pay

## 2021-11-16 DIAGNOSIS — Z0000 Encounter for general adult medical examination without abnormal findings: Secondary | ICD-10-CM

## 2021-11-16 NOTE — Progress Notes (Signed)
Virtual Visit via Telephone Note  I connected with Mackenzie Ward on 11/16/21 at 11:00 AM EST by telephone and verified that I am speaking with the correct person using two identifiers.  Location: Patient: Mackenzie Ward  Provider: Nobie Putnam, DO   I discussed the limitations, risks, security and privacy concerns of performing an evaluation and management service by telephone and the availability of in person appointments. I also discussed with the patient that there may be a patient responsible charge related to this service. The patient expressed understanding and agreed to proceed.   I provided 40 minutes of non-face-to-face time during this encounter.   Mackenzie Ward, CMA   HPI:  Patient presents to clinic today for their subsequent annual Medicare wellness exam.  Past Medical History:  Diagnosis Date   Asthma    COPD (chronic obstructive pulmonary disease) ([place])    Depression    High cholesterol     Current Outpatient Medications  Medication Sig Dispense Refill   albuterol (PROVENTIL) (2.5 MG/3ML) 0.083% nebulizer solution Take 3 mLs (2.5 mg total) by nebulization every 6 (six) hours as needed for wheezing or shortness of breath. 60 mL 1   ARIPiprazole (ABILIFY) 2 MG tablet Take 1 tablet (2 mg total) by mouth daily. 30 tablet 0   gabapentin (NEURONTIN) 300 MG capsule Take 1 capsule (300 mg total) by mouth 2 (two) times daily. 180 capsule 1   hydrOXYzine (ATARAX/VISTARIL) 10 MG tablet Take 1 tablet (10 mg total) by mouth 3 (three) times daily as needed for anxiety. 270 tablet 3   SPIRIVA HANDIHALER 18 MCG inhalation capsule Place 1 capsule (18 mcg total) into inhaler and inhale daily. 90 capsule 1   venlafaxine XR (EFFEXOR-XR) 150 MG 24 hr capsule Take total of 225 mg daily, along with 75 mg cap 90 capsule 3   venlafaxine XR (EFFEXOR-XR) 75 MG 24 hr capsule Take total of 225 mg daily, along with 75 mg cap 90 capsule 3   No current facility-administered  medications for this visit.    No Known Allergies  Family History  Problem Relation Age of Onset   Lymphoma Mother        Non Hodgkin   Dementia Mother    Alzheimer's disease Mother    Lymphoma Sister        Non Hodgkin   Depression Sister    Heart attack Father    Alcohol abuse Father    Dementia Brother    Dementia Maternal Uncle    Alzheimer's disease Maternal Aunt    Alzheimer's disease Paternal Aunt    Alzheimer's disease Paternal Grandmother    Alzheimer's disease Maternal Aunt    Alzheimer's disease Paternal Aunt     Social History   Socioeconomic History   Marital status: Widowed    Spouse name: Not on file   Number of children: Not on file   Years of education: Not on file   Highest education level: Some college, no degree  Occupational History   Occupation: retired  Tobacco Use   Smoking status: Every Day    Packs/day: 1.00    Years: 45.00    Pack years: 45.00    Types: Cigarettes   Smokeless tobacco: Never  Vaping Use   Vaping Use: Never used  Substance and Sexual Activity   Alcohol use: No    Alcohol/week: 0.0 standard drinks   Drug use: No   Sexual activity: Not Currently  Other Topics Concern   Not on file  Social History Narrative   Goes to dinner with friends often, goes to see her mom at nursing home on a regular basis    Social Determinants of Health   Financial Resource Strain: Not on file  Food Insecurity: Not on file  Transportation Needs: Not on file  Physical Activity: Not on file  Stress: Not on file  Social Connections: Not on file  Intimate Partner Violence: Not on file    Hospitiliaztions: No hospitalization in the past 12 mths.  Health Maintenance:    Flu: 07/19/21  Tetanus: 08/04/14  Pneumovax:  10/22/17  Prevnar: 06/30/14 Qualifies for Shingles Vaccine? Yes    Shingrix Completed?: No.    Education has been provided regarding the importance of this vaccine. Patient has been advised that she can get this vaccine at  no cost at her pharmacy.     Covid: 08/24/20, 12/25/19, 12/04/19  Mammogram: No longer indicated   Pap Smear: no longer indicated              Bone Density: 07/05/15  Colon Screening: 11/10/18  Eye Doctor: Annual Eye Exam  Dental Exam: Biannual dental visit    Providers:  PCP: Nobie Putnam, DO Behavioral Health:Mackenzie Colin Rhein, MD   I have personally reviewed and have noted:  1. The patient's medical and social history 2. Their use of alcohol, tobacco or illicit drugs 3. Their current medications and supplements 4. The patient's functional ability including ADL's, fall risks, home safety risks and hearing or visual impairment. 5. Diet and physical activities 6. Evidence for depression or mood disorder  Subjective:   Review of Systems:   Constitutional: Denies fever, malaise, fatigue, headache or abrupt weight changes.  HEENT: Denies eye pain, eye redness, ear pain, ringing in the ears, wax buildup, runny nose, nasal congestion, bloody nose, or sore throat. Respiratory: Denies difficulty breathing, shortness of breath, cough or sputum production.   Cardiovascular: Denies chest pain, chest tightness, palpitations or swelling in the hands or feet.  Gastrointestinal: Denies abdominal pain, bloating, constipation, diarrhea or blood in the stool.  GU: Denies urgency, frequency, pain with urination, burning sensation, blood in urine, odor or discharge. Musculoskeletal: Denies decrease in range of motion, difficulty with gait, muscle pain or joint pain and swelling.  Skin: Denies redness, rashes, lesions or ulcercations.  Neurological: Denies dizziness, difficulty with memory, difficulty with speech or problems with balance and coordination.  Psych: Denies anxiety, depression, SI/HI.  No other specific complaints in a complete review of systems (except as listed in HPI above).  Objective:  PE:   There were no vitals taken for this visit. Wt Readings from Last 3 Encounters:   07/19/21 150 lb 6.4 oz (68.2 kg)  01/16/21 146 lb 3.2 oz (66.3 kg)  11/24/20 146 lb (66.2 kg)     BMET    Component Value Date/Time   NA 135 01/09/2021 0902   NA 137 01/05/2016 0839   K 4.8 01/09/2021 0902   CL 99 01/09/2021 0902   CO2 28 01/09/2021 0902   GLUCOSE 75 01/09/2021 0902   BUN 9 01/09/2021 0902   BUN 6 (L) 01/05/2016 0839   CREATININE 0.71 01/09/2021 0902   CALCIUM 9.1 01/09/2021 0902   GFRNONAA 84 01/09/2021 0902   GFRAA 98 01/09/2021 0902    Lipid Panel     Component Value Date/Time   CHOL 242 (H) 01/09/2021 0902   TRIG 177 (H) 01/09/2021 0902   HDL 50 01/09/2021 0902   CHOLHDL 4.8 01/09/2021 0902  VLDL 27 11/05/2016 0001   LDLCALC 159 (H) 01/09/2021 0902    CBC    Component Value Date/Time   WBC 10.9 (H) 01/09/2021 0902   RBC 5.31 (H) 01/09/2021 0902   HGB 15.2 01/09/2021 0902   HGB 14.0 12/27/2015 0813   HCT 46.8 (H) 01/09/2021 0902   HCT 42.9 12/27/2015 0813   PLT 402 (H) 01/09/2021 0902   PLT 355 12/27/2015 0813   MCV 88.1 01/09/2021 0902   MCV 88 12/27/2015 0813   MCH 28.6 01/09/2021 0902   MCHC 32.5 01/09/2021 0902   RDW 14.3 01/09/2021 0902   RDW 15.2 12/27/2015 0813   LYMPHSABS 3,194 01/09/2021 0902   LYMPHSABS 2.3 12/27/2015 0813   MONOABS 1,144 (H) 11/05/2016 0001   EOSABS 174 01/09/2021 0902   EOSABS 0.1 12/27/2015 0813   BASOSABS 55 01/09/2021 0902   BASOSABS 0.0 12/27/2015 0813    Hgb A1C Lab Results  Component Value Date   HGBA1C 5.3 01/09/2021      Assessment and Plan:   Medicare Annual Wellness Visit:  Diet: Heart healthy  Physical activity: Sedentary Depression/mood screen: Negative,  [place] Office Visit from 01/16/2021 in [place]  PHQ-9 Total Score 11      Hearing: Intact to whispered voice Visual acuity: Grossly normal, performs annual eye exam  ADLs: Capable Fall risk: None Home safety: Good Cognitive evaluation:  6CIT Screen 11/16/2021 11/08/2020 10/28/2018 10/22/2017  What  Year? 0 points 0 points 0 points 0 points  What month? 0 points 0 points 0 points 0 points  What time? 0 points 0 points 0 points 0 points  Count back from 20 0 points 0 points 0 points 0 points  Months in reverse 0 points 0 points 0 points 0 points  Repeat phrase 0 points 0 points 0 points 0 points  Total Score 0 0 0 0  FALL RISK PREVENTION PERTAINING TO THE HOME:   Any stairs in or around the home? no If so, are there any without handrails? No    Home free of loose throw rugs in walkways, pet beds, electrical cords, etc? Yes  Adequate lighting in your home to reduce risk of falls? Yes    ASSISTIVE DEVICES UTILIZED TO PREVENT FALLS:   Life alert? No  Use of a cane, walker or w/c? Yes Grab bars in the bathroom? No  Shower chair or bench in shower? No  Elevated toilet seat or a handicapped toilet? No        EOL planning: Adv directives, full code  Nurse's Notes:   Next appointment: Annual Physical 01/16/22    Mackenzie Ward, CMA

## 2021-11-22 NOTE — Progress Notes (Signed)
Medicare Wellness Visit: ? ?Location:  ?Patient: Mackenzie Ward [facility]) ?Provider:  ?Nobie Putnam , DO (Office) ?                ? Donnie Mesa, CMA (In the office over the phone) ?

## 2021-11-22 NOTE — Progress Notes (Signed)
Virtual Visit via Video Note ? ?I connected with Mackenzie Ward on 11/24/21 at 10:30 AM EDT by a video enabled telemedicine application and verified that I am speaking with the correct person using two identifiers. ? ?Location: ?Patient: home ?Provider: office ?Persons participated in the visit- patient, provider  ?  ?I discussed the limitations of evaluation and management by telemedicine and the availability of in person appointments. The patient expressed understanding and agreed to proceed. ? ?  ?I discussed the assessment and treatment plan with the patient. The patient was provided an opportunity to ask questions and all were answered. The patient agreed with the plan and demonstrated an understanding of the instructions. ?  ?The patient was advised to call back or seek an in-person evaluation if the symptoms worsen or if the condition fails to improve as anticipated. ? ?I provided 15 minutes of non-face-to-face time during this encounter. ? ? ?Norman Clay, MD ? ? ? ? ?BH MD/PA/NP OP Progress Note ? ?11/24/2021 11:02 AM ?Mackenzie Ward  ?MRN:  [id number] ? ?Chief Complaint:  ?Chief Complaint  ?Patient presents with  ? Depression  ? Follow-up  ? ?HPI:  ?This is a follow-up appointment for depression.  ?She states that she is settling into her sister's place.  She thinks she has been making progress.  She believes Abilify has been working well.  She has been having more good days, and does not feel depressed anymore.  Although she still misses her husband, it is not intense.  She states that there is his picture near her father's picture.  They were getting along very well.  She is also hoping to have Memorial with his belongings.  She enjoys taking care of her sister's dog.  She occasionally takes a walk with his dog.  She enjoyed going out shopping with her sister.  She notices that she tends to be forgetful at times.  She especially has word-finding difficulty.  However, she thinks it is more of her  feeling overwhelmed, and she is not interested in pursuing any testing at this time.  She sleeps better.  She has occasional anxiety.  She thinks higher dose of gabapentin has been helpful for anxiety and neuropathic pain without any side effect.  She denies change in appetite.  She denies SI.  She feels comfortable to stay on the current medication regimen.  ? ?Wt Readings from Last 3 Encounters:  ?10/26/21 156 lb (70.8 kg)  ?07/19/21 150 lb 6.4 oz (68.2 kg)  ?04/24/21 148 lb 4.8 oz (67.3 kg)  ?  ? ?Daily routine: household chores,  ?Exercise: ?Employment: Retired in 2010. 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 Married twice before ?Number of children: 0 ?She grew up in [place]. She had "good childhood." She lost her mother in 11-05-2020. Her father deceased in 69. ? ? ? ?Visit Diagnosis:  ?  ICD-10-CM   ?1. MDD (major depressive disorder), recurrent, in partial remission ([place])  F33.41   ?  ?2. Anxiety state  F41.1   ?  ? ? ?Past Psychiatric History: Please see initial evaluation for full details. I have reviewed the history. No updates at this time.  ?  ? ?Past Medical History:  ?Past Medical History:  ?Diagnosis Date  ?  Asthma   ? COPD (chronic obstructive pulmonary disease) ([place])   ? Depression   ? High cholesterol   ?  ?Past Surgical History:  ?Procedure Laterality Date  ? BREAST SURGERY Right 2013  ? CATARACT EXTRACTION W/PHACO Left 08/26/2019  ? Procedure: CATARACT EXTRACTION PHACO AND INTRAOCULAR LENS PLACEMENT (IOC) LEFT  4.45    00:40.5    11.0%;  Surgeon: Leandrew Koyanagi, MD;  Location: [place];  Service: Ophthalmology;  Laterality: Left;  ? CATARACT EXTRACTION W/PHACO Right 09/30/2019  ? Procedure: CATARACT EXTRACTION PHACO AND  INTRAOCULAR LENS PLACEMENT (IOC) RIGHT 4.05  00:44.4  9.1% ;  Surgeon: Leandrew Koyanagi, MD;  Location: [place];  Service: Ophthalmology;  Laterality: Right;  ? COLONOSCOPY  15 years ago  ? COLONOSCOPY WITH PROPOFOL N/A 11/10/2018  ? Procedure: COLONOSCOPY WITH PROPOFOL;  Surgeon: Lin Landsman, MD;  [facility] END[facility];  Service: Gastroenterology;  Laterality: N/A;  ? EYE SURGERY    ? TONSILLECTOMY    ? ? ?Family Psychiatric History: Please see initial evaluation for full details. I have reviewed the history. No updates at this time.  ?  ? ?Family History:  ?Family History  ?Problem Relation Age of Onset  ? Lymphoma Mother   ?     Non Hodgkin  ? Dementia Mother   ? Alzheimer's disease Mother   ? Lymphoma Sister   ?     Non Hodgkin  ? Depression Sister   ? Heart attack Father   ? Alcohol abuse Father   ? Dementia Brother   ? Dementia Maternal Uncle   ? Alzheimer's disease Maternal Aunt   ? Alzheimer's disease Paternal Aunt   ? Alzheimer's disease Paternal Grandmother   ? Alzheimer's disease Maternal Aunt   ? Alzheimer's disease Paternal Aunt   ? ? ?Social History:  ?Social History  ? ?Socioeconomic History  ? Marital status: Widowed  ?  Spouse name: Not on file  ? Number of children: Not on file  ? Years of education: Not on file  ? Highest education level: Some college, no degree  ?Occupational History  ? Occupation: retired  ?Tobacco Use  ? Smoking status: Every Day  ?  Packs/day: 1.00  ?  Years: 45.00  ?  Pack years: 45.00  ?  Types: Cigarettes  ? Smokeless tobacco: Never  ?Vaping Use  ? Vaping Use: Never used  ?Substance and Sexual Activity  ? Alcohol use: No  ?  Alcohol/week: 0.0 standard drinks  ? Drug use: No  ? Sexual activity: Not Currently  ?Other Topics Concern  ? Not on file  ?Social History Narrative  ? Goes to dinner with friends often, goes to see her mom at nursing home on a regular basis .  ?   ?   ? Pt plans on taking a trip to [place] 05/2022.  ? ?Social Determinants of  Health  ? ?Financial Resource Strain: Low Risk   ? Difficulty of Paying Living Expenses: Not hard at all  ?Food Insecurity: No Food Insecurity  ? Worried About Charity fundraiser in the Last Year: Never true  ? Ran Out of Food in the Last Year: Never true  ?Transportation Needs: No Transportation Needs  ? Lack of Transportation (Medical): No  ? Lack of Transportation (Non-Medical): No  ?Physical Activity: Inactive  ? Days of Exercise per Week: 0 days  ? Minutes of Exercise per Session: 0 min  ?Stress: Stress Concern Present  ? Feeling of Stress : To  some extent  ?Social Connections: Socially Isolated  ? Frequency of Communication with Friends and Family: Twice a week  ? Frequency of Social Gatherings with Friends and Family: Never  ? Attends Religious Services: Never  ? Active Member of Clubs or Organizations: No  ? Attends Archivist Meetings: Never  ? Marital Status: Widowed  ? ? ?Allergies: No Known Allergies ? ?Metabolic Disorder Labs: ?Lab Results  ?Component Value Date  ? HGBA1C 5.3 01/09/2021  ? MPG 105 01/09/2021  ? MPG 111 11/27/2018  ? ?No results found for: PROLACTIN ?Lab Results  ?Component Value Date  ? CHOL 242 (H) 01/09/2021  ? TRIG 177 (H) 01/09/2021  ? HDL 50 01/09/2021  ? CHOLHDL 4.8 01/09/2021  ? VLDL 27 11/05/2016  ? LDLCALC 159 (H) 01/09/2021  ? LDLCALC 178 (H) 11/27/2018  ? ?Lab Results  ?Component Value Date  ? TSH 3.98 01/09/2021  ? TSH 4.47 11/27/2018  ? ? ?Therapeutic Level Labs: ?Lab Results  ?Component Value Date  ? LITHIUM 0.7 04/13/2021  ? ?No results found for: VALPROATE ?No components found for:  CBMZ ? ?Current Medications: ?Current Outpatient Medications  ?Medication Sig Dispense Refill  ? albuterol (PROVENTIL) (2.5 MG/3ML) 0.083% nebulizer solution Take 3 mLs (2.5 mg total) by nebulization every 6 (six) hours as needed for wheezing or shortness of breath. 60 mL 1  ? [START ON 11/26/2021] ARIPiprazole (ABILIFY) 2 MG tablet Take 1 tablet (2 mg total) by mouth daily. 90  tablet 0  ? gabapentin (NEURONTIN) 300 MG capsule Take 1 capsule (300 mg total) by mouth 2 (two) times daily. 180 capsule 1  ? hydrOXYzine (ATARAX/VISTARIL) 10 MG tablet Take 1 tablet (10 mg total) by mouth 3 (three) tim

## 2021-11-24 ENCOUNTER — Other Ambulatory Visit: Payer: Self-pay

## 2021-11-24 ENCOUNTER — Telehealth (INDEPENDENT_AMBULATORY_CARE_PROVIDER_SITE_OTHER): Payer: Medicare PPO | Admitting: Psychiatry

## 2021-11-24 ENCOUNTER — Encounter: Payer: Self-pay | Admitting: Psychiatry

## 2021-11-24 DIAGNOSIS — F3341 Major depressive disorder, recurrent, in partial remission: Secondary | ICD-10-CM

## 2021-11-24 DIAGNOSIS — F411 Generalized anxiety disorder: Secondary | ICD-10-CM

## 2021-11-24 MED ORDER — ARIPIPRAZOLE 2 MG PO TABS: 2.0000 mg | ORAL_TABLET | Freq: Every day | ORAL | 0 refills | Status: DC

## 2021-11-24 NOTE — Patient Instructions (Signed)
Continue venlafaxine 225 mg daily ?Continue Abilify 2 mg at night  ?Continue gabapentin 300 mg three times a day  ?Continue hydroxyzine 10 mg three times a day as needed for anxiety ?Next appointment: 5/16 at 4 PM ?

## 2021-12-01 NOTE — Telephone Encounter (Signed)
This was done 6 months ago ?

## 2021-12-12 ENCOUNTER — Other Ambulatory Visit: Payer: Self-pay | Admitting: Unknown Physician Specialty

## 2021-12-12 DIAGNOSIS — H903 Sensorineural hearing loss, bilateral: Secondary | ICD-10-CM

## 2021-12-12 DIAGNOSIS — H6123 Impacted cerumen, bilateral: Secondary | ICD-10-CM | POA: Diagnosis not present

## 2021-12-22 ENCOUNTER — Other Ambulatory Visit: Payer: Self-pay | Admitting: *Deleted

## 2021-12-22 DIAGNOSIS — Z122 Encounter for screening for malignant neoplasm of respiratory organs: Secondary | ICD-10-CM

## 2021-12-22 DIAGNOSIS — Z87891 Personal history of nicotine dependence: Secondary | ICD-10-CM

## 2021-12-22 DIAGNOSIS — F17210 Nicotine dependence, cigarettes, uncomplicated: Secondary | ICD-10-CM

## 2021-12-28 ENCOUNTER — Ambulatory Visit
Admission: RE | Admit: 2021-12-28 | Discharge: 2021-12-28 | Disposition: A | Discharge status: Home / Self Care | Payer: Medicare PPO | Source: Ambulatory Visit | Attending: Unknown Physician Specialty | Admitting: Unknown Physician Specialty

## 2021-12-28 DIAGNOSIS — H903 Sensorineural hearing loss, bilateral: Secondary | ICD-10-CM | POA: Diagnosis not present

## 2021-12-28 DIAGNOSIS — I6782 Cerebral ischemia: Secondary | ICD-10-CM | POA: Diagnosis not present

## 2021-12-28 DIAGNOSIS — Q283 Other malformations of cerebral vessels: Secondary | ICD-10-CM | POA: Diagnosis not present

## 2021-12-28 MED ORDER — GADOBENATE DIMEGLUMINE 529 MG/ML IV SOLN
14.0000 mL | Freq: Once | INTRAVENOUS | Status: AC | PRN
  Administered 2021-12-28: 14 mL via INTRAVENOUS

## 2022-01-02 ENCOUNTER — Ambulatory Visit
Admission: RE | Admit: 2022-01-02 | Discharge: 2022-01-02 | Disposition: A | Discharge status: Home / Self Care | Payer: Medicare PPO | Source: Ambulatory Visit | Attending: Acute Care | Admitting: Acute Care

## 2022-01-02 DIAGNOSIS — Z122 Encounter for screening for malignant neoplasm of respiratory organs: Secondary | ICD-10-CM | POA: Diagnosis not present

## 2022-01-02 DIAGNOSIS — F17210 Nicotine dependence, cigarettes, uncomplicated: Secondary | ICD-10-CM | POA: Diagnosis not present

## 2022-01-02 DIAGNOSIS — Z87891 Personal history of nicotine dependence: Secondary | ICD-10-CM | POA: Diagnosis not present

## 2022-01-04 ENCOUNTER — Other Ambulatory Visit: Payer: Self-pay | Admitting: Acute Care

## 2022-01-04 DIAGNOSIS — Z87891 Personal history of nicotine dependence: Secondary | ICD-10-CM

## 2022-01-04 DIAGNOSIS — Z122 Encounter for screening for malignant neoplasm of respiratory organs: Secondary | ICD-10-CM

## 2022-01-04 DIAGNOSIS — F17210 Nicotine dependence, cigarettes, uncomplicated: Secondary | ICD-10-CM

## 2022-01-06 ENCOUNTER — Other Ambulatory Visit: Payer: Self-pay | Admitting: Psychiatry

## 2022-01-08 ENCOUNTER — Other Ambulatory Visit: Payer: Self-pay

## 2022-01-08 DIAGNOSIS — Z0000 Encounter for general adult medical examination without abnormal findings: Secondary | ICD-10-CM

## 2022-01-08 DIAGNOSIS — Z79899 Other long term (current) drug therapy: Secondary | ICD-10-CM | POA: Diagnosis not present

## 2022-01-08 DIAGNOSIS — R7309 Other abnormal glucose: Secondary | ICD-10-CM

## 2022-01-08 DIAGNOSIS — E559 Vitamin D deficiency, unspecified: Secondary | ICD-10-CM | POA: Diagnosis not present

## 2022-01-08 DIAGNOSIS — E782 Mixed hyperlipidemia: Secondary | ICD-10-CM | POA: Diagnosis not present

## 2022-01-09 ENCOUNTER — Other Ambulatory Visit: Payer: Medicare PPO

## 2022-01-09 ENCOUNTER — Other Ambulatory Visit: Payer: Self-pay | Admitting: Family Medicine

## 2022-01-09 DIAGNOSIS — E782 Mixed hyperlipidemia: Secondary | ICD-10-CM

## 2022-01-09 DIAGNOSIS — F411 Generalized anxiety disorder: Secondary | ICD-10-CM

## 2022-01-09 DIAGNOSIS — Z0000 Encounter for general adult medical examination without abnormal findings: Secondary | ICD-10-CM

## 2022-01-09 LAB — COMPREHENSIVE METABOLIC PANEL WITH GFR
AG Ratio: 1.3 (calc) (ref 1.0–2.5)
ALT: 17 U/L (ref 6–29)
AST: 20 U/L (ref 10–35)
Albumin: 3.8 g/dL (ref 3.6–5.1)
Alkaline phosphatase (APISO): 125 U/L (ref 37–153)
BUN: 13 mg/dL (ref 7–25)
CO2: 31 mmol/L (ref 20–32)
Calcium: 9.3 mg/dL (ref 8.6–10.4)
Chloride: 99 mmol/L (ref 98–110)
Creat: 0.79 mg/dL (ref 0.60–1.00)
Globulin: 2.9 g/dL (ref 1.9–3.7)
Glucose, Bld: 93 mg/dL (ref 65–99)
Potassium: 4.8 mmol/L (ref 3.5–5.3)
Sodium: 138 mmol/L (ref 135–146)
Total Bilirubin: 0.3 mg/dL (ref 0.2–1.2)
Total Protein: 6.7 g/dL (ref 6.1–8.1)

## 2022-01-09 LAB — HEMOGLOBIN A1C
Hgb A1c MFr Bld: 5.6 %{Hb}
Mean Plasma Glucose: 114 mg/dL
eAG (mmol/L): 6.3 mmol/L

## 2022-01-09 LAB — LIPID PANEL
Cholesterol: 267 mg/dL — ABNORMAL HIGH
HDL: 62 mg/dL
LDL Cholesterol (Calc): 173 mg/dL — ABNORMAL HIGH
Non-HDL Cholesterol (Calc): 205 mg/dL — ABNORMAL HIGH
Total CHOL/HDL Ratio: 4.3 (calc)
Triglycerides: 167 mg/dL — ABNORMAL HIGH

## 2022-01-09 LAB — CBC WITH DIFFERENTIAL/PLATELET
Absolute Monocytes: 886 cells/uL (ref 200–950)
Basophils Absolute: 32 cells/uL (ref 0–200)
Basophils Relative: 0.3 %
Eosinophils Absolute: 43 cells/uL (ref 15–500)
Eosinophils Relative: 0.4 %
HCT: 49.3 % — ABNORMAL HIGH (ref 35.0–45.0)
Hemoglobin: 15.8 g/dL — ABNORMAL HIGH (ref 11.7–15.5)
Lymphs Abs: 3121 cells/uL (ref 850–3900)
MCH: 27.8 pg (ref 27.0–33.0)
MCHC: 32 g/dL (ref 32.0–36.0)
MCV: 86.8 fL (ref 80.0–100.0)
MPV: 11.2 fL (ref 7.5–12.5)
Monocytes Relative: 8.2 %
Neutro Abs: 6718 cells/uL (ref 1500–7800)
Neutrophils Relative %: 62.2 %
Platelets: 369 10*3/uL (ref 140–400)
RBC: 5.68 10*6/uL — ABNORMAL HIGH (ref 3.80–5.10)
RDW: 14.8 % (ref 11.0–15.0)
Total Lymphocyte: 28.9 %
WBC: 10.8 10*3/uL (ref 3.8–10.8)

## 2022-01-09 LAB — TSH: TSH: 2.3 m[IU]/L (ref 0.40–4.50)

## 2022-01-09 LAB — VITAMIN D 25 HYDROXY (VIT D DEFICIENCY, FRACTURES): Vit D, 25-Hydroxy: 29 ng/mL — ABNORMAL LOW (ref 30–100)

## 2022-01-09 NOTE — Telephone Encounter (Signed)
Requested medication (s) are due for refill today: yes ? ?Requested medication (s) are on the active medication list: yes   ? ?Last refill: 06/23/21  #180  0 refills ? ?Future visit scheduled yes 01/16/22 ? ?Notes to clinic:Historical provider. Please review. ? ?Requested Prescriptions  ?Pending Prescriptions Disp Refills  ? gabapentin (NEURONTIN) 300 MG capsule [Pharmacy Med Name: GABAPENTIN '300MG'$  CAPSULES] 180 capsule 1  ?  Sig: TAKE 1 CAPSULE BY MOUTH TWICE DAILY  ?  ? Neurology: Anticonvulsants - gabapentin Passed - 01/09/2022  8:04 AM  ?  ?  Passed - Cr in normal range and within 360 days  ?  Creat  ?Date Value Ref Range Status  ?01/08/2022 0.79 0.60 - 1.00 mg/dL Final  ?  ?  ?  ?  Passed - Completed PHQ-2 or PHQ-9 in the last 360 days  ?  ?  Passed - Valid encounter within last 12 months  ?  Recent Outpatient Visits   ? ?      ? 5 months ago BPPV (benign paroxysmal positional vertigo), bilateral  ? [facility] Olin Hauser, DO  ? 11 months ago Annual physical exam  ? [place], DO  ? 1 year ago Bipolar disorder in remission [facility])  ? [place], DO  ? 2 years ago Chronic right-sided low back pain with right-sided sciatica  ? [place], DO  ? 3 years ago Centrilobular emphysema [facility])  ? [place], DO  ? ?  ?  ?Future Appointments   ? ?        ? In 1 week [facility] [place]  ? ?  ? ? ?  ?  ?  ? ? ? ? ?

## 2022-01-16 ENCOUNTER — Ambulatory Visit: Payer: Medicare PPO | Admitting: Family Medicine

## 2022-01-16 ENCOUNTER — Other Ambulatory Visit: Payer: Self-pay | Admitting: Family Medicine

## 2022-01-16 ENCOUNTER — Encounter: Payer: Self-pay | Admitting: Family Medicine

## 2022-01-16 VITALS — BP 120/70 | HR 98 | Ht 58.5 in | Wt 150.4 lb

## 2022-01-16 DIAGNOSIS — E782 Mixed hyperlipidemia: Secondary | ICD-10-CM

## 2022-01-16 DIAGNOSIS — I700 Atherosclerosis of aorta: Secondary | ICD-10-CM | POA: Diagnosis not present

## 2022-01-16 DIAGNOSIS — Z0000 Encounter for general adult medical examination without abnormal findings: Secondary | ICD-10-CM | POA: Diagnosis not present

## 2022-01-16 DIAGNOSIS — J432 Centrilobular emphysema: Secondary | ICD-10-CM | POA: Diagnosis not present

## 2022-01-16 DIAGNOSIS — Z720 Tobacco use: Secondary | ICD-10-CM

## 2022-01-16 DIAGNOSIS — F3170 Bipolar disorder, currently in remission, most recent episode unspecified: Secondary | ICD-10-CM | POA: Diagnosis not present

## 2022-01-16 MED ORDER — ROSUVASTATIN CALCIUM 5 MG PO TABS: 5.0000 mg | ORAL_TABLET | Freq: Every day | ORAL | 3 refills | Status: AC

## 2022-01-16 NOTE — Progress Notes (Signed)
? ?Subjective:  ? ? Patient ID: Mackenzie Ward, female    DOB: September 24, 1945, 76 y.o.   MRN: [id number] ? ?Mackenzie Ward is a 76 y.o. female presenting on 01/16/2022 for Annual Exam ? ? ?HPI ? ?Here for Annual Physical and Lab Review. ?  ?PMH Vertigo ?  ?FOLLOW-UP COPD (Mild Emphysema) ?Previous visit for COPD follow-up, continued on Spiriva daily and had LDCT for Lung CA Screen, see prior notes for background information.  ?Last imaging 11/2020 due for repeat 2023 ?- Today patient reports doing well. No recent flare. ?- She can tolerate regular activity without dyspnea. ?- Continues on Spiriva once daily most days, but skips some days when doesn't feel she needs it ?Admits weather change and allergy impact her breathing. ?- Rarely using Albuterol nebulizer once every 6 months except flare up ?- She has never seen Pulmonology or had PFT, and she is not interested at this time. ?Denies any worsening dyspnea, chest pain or tightness, productive cough, wheezing ?  ?Chronic Low Back Pain, OA/DJD / Sciatica R sided ?Chronic problem ?Associated symptoms RLS and RLE extremity pain and radiating nerve pain ?Affecting her gait at times. ?Gait ?- Taking Gabapentin '300mg'$  nightly for RLS helping her symptoms ?- bottom of feet, cold burn prickle occasionally ?Handicap placard ? ?HYPERLIPIDEMIA: ?- Reports concerns. Last lipid panel 01/2022, elevated LDL >170 ?Past history on Simvastatin higher dose with myalgias ?Interested in trial of statin again ?Now improved diet ?Admits still smoking ?      ?Bipolar Disorder in remission  ?Followed by [facility] ?Updates her mother passed away in 11-22-20. She was visiting her in the nursing home with her sister, support system and husband. ? Husband passed away 21-Apr-2021 ? ?On med management previously with Psychiatry ? ?Continue venlafaxine 225 (150 + 75) mg daily ?Continue Abilify 2 mg at night  ?Continue gabapentin 300 mg three times a day  ?Continue hydroxyzine 10 mg three times a day  as needed for anxiety ? ?Followed by Dr Modesta Messing [facility]. She is now requesting to transfer mental health management to our office since she has done well for a while is stable and prefers to limit additional doctors visits and cost. ? ?She is no longer on Lithium ? ?Health Maintenance: ? ?Previous had Colonoscopy. 3.2020 and she had polyps removed, repeat 3-5 years, she declines bowel prep and says Cologuard may work in future. ? ? ?  01/16/2022  ?  2:39 PM 10/26/2021  ? 10:05 AM 06/23/2021  ?  8:40 AM  ?Depression screen PHQ 2/9  ?Decreased Interest 1 2 0  ?Down, Depressed, Hopeless '1 1 1  '$ ?PHQ - 2 Score '2 3 1  '$ ?Altered sleeping 0 1   ?Tired, decreased energy 1 3   ?Change in appetite 2 1   ?Feeling bad or failure about yourself  0 1   ?Trouble concentrating 2 3   ?Moving slowly or fidgety/restless 0 3   ?Suicidal thoughts 0 0   ?PHQ-9 Score 7 15   ?Difficult doing work/chores Very difficult Somewhat difficult   ? ? ?  01/16/2022  ?  2:39 PM 01/07/2020  ?  9:59 AM  ?GAD 7 : Generalized Anxiety Score  ?Nervous, Anxious, on Edge 1 0  ?Control/stop worrying 0 0  ?Worry too much - different things 0 0  ?Trouble relaxing 2 3  ?Restless 1 0  ?Easily annoyed or irritable 1 0  ?Afraid - awful might happen 0 0  ?Total GAD 7  Score 5 3  ?Anxiety Difficulty Very difficult Not difficult at all  ? ? ? ? ?Past Medical History:  ?Diagnosis Date  ? Asthma   ? COPD (chronic obstructive pulmonary disease) ([place])   ? Depression   ? High cholesterol   ? ?Past Surgical History:  ?Procedure Laterality Date  ? BREAST SURGERY Right 2013  ? CATARACT EXTRACTION W/PHACO Left 08/26/2019  ? Procedure: CATARACT EXTRACTION PHACO AND INTRAOCULAR LENS PLACEMENT (IOC) LEFT  4.45    00:40.5    11.0%;  Surgeon: Leandrew Koyanagi, MD;  Location: [place];  Service: Ophthalmology;  Laterality: Left;  ? CATARACT EXTRACTION W/PHACO Right 09/30/2019  ? Procedure: CATARACT EXTRACTION PHACO AND INTRAOCULAR LENS PLACEMENT (IOC) RIGHT 4.05   00:44.4  9.1% ;  Surgeon: Leandrew Koyanagi, MD;  Location: [place];  Service: Ophthalmology;  Laterality: Right;  ? COLONOSCOPY  15 years ago  ? COLONOSCOPY WITH PROPOFOL N/A 11/10/2018  ? Procedure: COLONOSCOPY WITH PROPOFOL;  Surgeon: Lin Landsman, MD;  Location: [facility] ENDOSCOPY;  Service: Gastroenterology;  Laterality: N/A;  ? EYE SURGERY    ? TONSILLECTOMY    ? ?Social History  ? ?Socioeconomic History  ? Marital status: Widowed  ?  Spouse name: Not on file  ? Number of children: Not on file  ? Years of education: Not on file  ? Highest education level: Some college, no degree  ?Occupational History  ? Occupation: retired  ?Tobacco Use  ? Smoking status: Every Day  ?  Packs/day: 1.00  ?  Years: 45.00  ?  Pack years: 45.00  ?  Types: Cigarettes  ? Smokeless tobacco: Never  ?Vaping Use  ? Vaping Use: Never used  ?Substance and Sexual Activity  ? Alcohol use: No  ?  Alcohol/week: 0.0 standard drinks  ? Drug use: No  ? Sexual activity: Not Currently  ?Other Topics Concern  ? Not on file  ?Social History Narrative  ? Goes to dinner with friends often, goes to see her mom at nursing home on a regular basis .  ?   ?   ? Pt plans on taking a trip to [place] 05/2022.  ? ?Social Determinants of Health  ? ?Financial Resource Strain: Low Risk   ? Difficulty of Paying Living Expenses: Not hard at all  ?Food Insecurity: No Food Insecurity  ? Worried About Charity fundraiser in the Last Year: Never true  ? Ran Out of Food in the Last Year: Never true  ?Transportation Needs: No Transportation Needs  ? Lack of Transportation (Medical): No  ? Lack of Transportation (Non-Medical): No  ?Physical Activity: Inactive  ? Days of Exercise per Week: 0 days  ? Minutes of Exercise per Session: 0 min  ?Stress: Stress Concern Present  ? Feeling of Stress : To some extent  ?Social Connections: Socially Isolated  ? Frequency of Communication with Friends and Family: Twice a week  ? Frequency of Social Gatherings with  Friends and Family: Never  ? Attends Religious Services: Never  ? Active Member of Clubs or Organizations: No  ? Attends Archivist Meetings: Never  ? Marital Status: Widowed  ?Intimate Partner Violence: Not At Risk  ? Fear of Current or Ex-Partner: No  ? Emotionally Abused: No  ? Physically Abused: No  ? Sexually Abused: No  ? ?Family History  ?Problem Relation Age of Onset  ? Lymphoma Mother   ?     Non Hodgkin  ? Dementia Mother   ?  Alzheimer's disease Mother   ? Lymphoma Sister   ?     Non Hodgkin  ? Depression Sister   ? Heart attack Father   ? Alcohol abuse Father   ? Dementia Brother   ? Dementia Maternal Uncle   ? Alzheimer's disease Maternal Aunt   ? Alzheimer's disease Paternal Aunt   ? Alzheimer's disease Paternal Grandmother   ? Alzheimer's disease Maternal Aunt   ? Alzheimer's disease Paternal Aunt   ? ?Current Outpatient Medications on File Prior to Visit  ?Medication Sig  ? albuterol (PROVENTIL) (2.5 MG/3ML) 0.083% nebulizer solution Take 3 mLs (2.5 mg total) by nebulization every 6 (six) hours as needed for wheezing or shortness of breath.  ? gabapentin (NEURONTIN) 300 MG capsule TAKE 1 CAPSULE BY MOUTH TWICE DAILY  ? hydrOXYzine (ATARAX/VISTARIL) 10 MG tablet Take 1 tablet (10 mg total) by mouth 3 (three) times daily as needed for anxiety.  ? SPIRIVA HANDIHALER 18 MCG inhalation capsule Place 1 capsule (18 mcg total) into inhaler and inhale daily.  ? venlafaxine XR (EFFEXOR-XR) 150 MG 24 hr capsule Take total of 225 mg daily, along with 75 mg cap  ? venlafaxine XR (EFFEXOR-XR) 75 MG 24 hr capsule Take total of 225 mg daily, along with 75 mg cap  ? ARIPiprazole (ABILIFY) 2 MG tablet Take 1 tablet (2 mg total) by mouth daily.  ? ?No current facility-administered medications on file prior to visit.  ? ? ?Review of Systems  ?Constitutional:  Negative for activity change, appetite change, chills, diaphoresis, fatigue and fever.  ?HENT:  Negative for congestion and hearing loss.   ?Eyes:   Negative for visual disturbance.  ?Respiratory:  Negative for cough, chest tightness, shortness of breath and wheezing.   ?Cardiovascular:  Negative for chest pain, palpitations and leg swelling.  ?Gastrointestinal:

## 2022-01-16 NOTE — Assessment & Plan Note (Signed)
Stable chronic problem in remission ?Followed by [facility]iat[facility] previously Dr Modesta Messing ?Off Lithium ?Off Seroquel ?Currently doing well on regimen with: ? ?Continue venlafaxine 225 (150 + 75) mg daily ?Continue Abilify 2 mg at night  ?Continue gabapentin 300 mg three times a day  ?Continue hydroxyzine 10 mg three times a day as needed for anxiety ? ?She is requesting to discontinue Psychiatry follow up at this time and transfer her med management to me as her PCP due to time, cost, stability on regimen. I discussed with her that this is possible given her current situation. I will route chart and message to her Psychiatry at [facility] and notify them of patients wishes and I will agree to manage her current mental health medications. I discussed also with patient if her mental health was worsening or any complications, we may need to seek care again with psychiatry and we could reach out to them if needed. ?

## 2022-01-16 NOTE — Assessment & Plan Note (Signed)
Elevated LDL >170 ?Failed Simvastatin in past. Myalgia ?Re try statin with low dose Rosuvastatin '5mg'$  nightly, dosing instructions given, repeat lipid cmet in 3 months only lab ? ?Encourage improved lifestyle - low carb/cholesterol, reduce portion size, start regular exercise ? ?

## 2022-01-16 NOTE — Assessment & Plan Note (Signed)
Asymptomatic. Stable, remains improved COPD, mild emphysema ?Improved functional respiratory status on anti cholinergic ?No formal PFT or Pulmonology - patient declines ?Infrequent flares ?Last LDCT image 11/2020 - has orders for 2023 ? ?Plan ?Continue Spiriva 26mg daily inhalation ?Smoking cessation - declined ? ?Future offer Breztri ?

## 2022-01-16 NOTE — Patient Instructions (Addendum)
Thank you for coming to the office today. ? ?You are at increased risk of future Cardiovascular complications such as Heart Attack or Stroke from an artery blockage due to abnormal cholesterol and/or risk factors. ?- As discussed, Statin Cholesterol pills both can both LOWER cholesterol and REDUCE this future risk of heart attack and stroke ?- Start Rosuvastatin (generic Crestor) '5mg'$  pill once at bedtime every night ? ?If you develop mild aches or pains in muscle or joint that does NOT improve or go away after first 3-4 weeks then this may require [place] to adjust the dose. First I would recommend STOPPING the medication for a few weeks until your ache and pain symptoms completely RESOLVE. Then you can restart at a LOWER DOSE either HALF a pill at bedtime every night or LESS OFTEN such as one pill a week only and then gradually increase to every other day or max dose of 3 times a week ? ?Lastly, sometimes we need to try other versions of this medicine to find one that works for you and does not cause side effects. ? ?I will notify Dr Modesta Messing that you wish to have me take over your medications for mental health and we will contact her in future if we need help. ? ? ?Please schedule a Follow-up Appointment to: Return in about 6 months (around 07/19/2022) for 3 month fasting lab only Cholesterol then 6 month November follow-up Mental Health meds update, COPD. ? ?If you have any other questions or concerns, please feel free to call the office or send a message through [place]. You may also schedule an earlier appointment if necessary. ? ?Additionally, you may be receiving a survey about your experience at our office within a few days to 1 week by e-mail or mail. We value your feedback. ? ?Nobie Putnam, DO ?[place] ?

## 2022-01-16 NOTE — Assessment & Plan Note (Signed)
On prior imaging ?Start Statin therapy today with Rosuvastatin ?

## 2022-01-22 ENCOUNTER — Telehealth: Payer: Self-pay | Admitting: Psychiatry

## 2022-01-22 NOTE — Telephone Encounter (Addendum)
Provider requested call to patient to determine if information from PCP indicating patient's intent to cancel appointment tomorrow and have PCP manage meds going forward is accurate. No answer. Left VM requesting call back to confirm. ? ?Patient called back. Confirmed cancel for tomorrow and PCP will be following for meds. Wants to be able to call as needed in case things worsen. Provider made aware. ? ?

## 2022-01-23 ENCOUNTER — Telehealth: Payer: Medicare PPO | Admitting: Psychiatry

## 2022-02-22 ENCOUNTER — Other Ambulatory Visit: Payer: Self-pay | Admitting: Psychiatry

## 2022-02-22 DIAGNOSIS — F3170 Bipolar disorder, currently in remission, most recent episode unspecified: Secondary | ICD-10-CM

## 2022-02-23 ENCOUNTER — Telehealth: Payer: Self-pay | Admitting: Family Medicine

## 2022-02-23 DIAGNOSIS — F3170 Bipolar disorder, currently in remission, most recent episode unspecified: Secondary | ICD-10-CM

## 2022-02-23 NOTE — Telephone Encounter (Signed)
Requested medication (s) are due for refill today: Yes  Requested medication (s) are on the active medication list: Yes  Last refill:  01/16/22  Future visit scheduled: Yes  Notes to clinic:  See request.    Requested Prescriptions  Pending Prescriptions Disp Refills   ARIPiprazole (ABILIFY) 2 MG tablet [Pharmacy Med Name: ARIPIPRAZOLE '2MG'$  TABLETS] 90 tablet 0    Sig: TAKE 1 TABLET(2 MG) BY MOUTH DAILY     There is no refill protocol information for this order

## 2022-02-27 ENCOUNTER — Telehealth: Payer: Self-pay | Admitting: Family Medicine

## 2022-02-27 NOTE — Telephone Encounter (Signed)
Patient called in to check the status of her abilify request. I informed her that she needed to get her prescription from her psychiatrist per telephone encounter request.  Patient expressed understanding and will follow up with her psychiatrist

## 2022-02-27 NOTE — Progress Notes (Unsigned)
Virtual Visit via Video Note  I connected with Mackenzie Ward on 02/28/22 at  3:00 PM EDT by a video enabled telemedicine application and verified that I am speaking with the correct person using two identifiers.  Location: Patient: home Provider: office Persons participated in the visit- patient, provider    I discussed the limitations of evaluation and management by telemedicine and the availability of in person appointments. The patient expressed understanding and agreed to proceed.     I discussed the assessment and treatment plan with the patient. The patient was provided an opportunity to ask questions and all were answered. The patient agreed with the plan and demonstrated an understanding of the instructions.   The patient was advised to call back or seek an in-person evaluation if the symptoms worsen or if the condition fails to improve as anticipated.  I provided 15 minutes of non-face-to-face time during this encounter.   Norman Clay, MD    [facility] MD/PA/NP OP Progress Note  02/28/2022 3:38 PM Mackenzie Ward  MRN:  [id number]  Chief Complaint:  Chief Complaint  Patient presents with   Follow-up   Depression   HPI:  This is a follow-up appointment for depression and anxiety.  She states that she could not get a refill at her PCP, and would prefer to continue to see this Probation officer.  She is currently at the beach with her sister's family.  It is nice to get together with her family.  She has an upcoming plan for a cruise, going to [place] to watch [place].  However, she feels down in an empty.  She states that depression is always there.  She feels the world is blah, not excited.  Today would have been 69th birthday of her husband.  Although it is not consuming, she misses him.  She feels nervous when she is around with people.  Although she used to enjoy being around with children, she was feeling overstimulated when she was with them on weekend.  Although she  thinks she is doing okay, she is interested in adjustment in her medication for depression.  She sleeps well.  She denies change in appetite.  She denies SI.  She denies alcohol use or drug use.     Wt Readings from Last 3 Encounters:  01/16/22 150 lb 6.4 oz (68.2 kg)  10/26/21 156 lb (70.8 kg)  07/19/21 150 lb 6.4 oz (68.2 kg)     Daily routine: household chores,  Exercise: Employment: Retired in 2010. 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 Married twice before Number of children: 0 She grew up in [place]. She had "good childhood." She lost her mother in Nov 10, 2020. Her father deceased in 71.  Visit Diagnosis:    ICD-10-CM   1. MDD (major depressive disorder), recurrent episode, mild (HCC)  F33.0 ARIPiprazole (ABILIFY) 5 MG tablet    2. Anxiety state  F41.1       Past Psychiatric History: Please see initial evaluation for full details. I have reviewed the history. No updates at this time.     Past Medical History:  Past Medical History:  Diagnosis Date   Asthma    COPD (chronic obstructive pulmonary disease) ([place])    Depression  High cholesterol     Past Surgical History:  Procedure Laterality Date   BREAST SURGERY Right 2013   CATARACT EXTRACTION W/PHACO Left 08/26/2019   Procedure: CATARACT EXTRACTION PHACO AND INTRAOCULAR LENS PLACEMENT (IOC) LEFT  4.45    00:40.5    11.0%;  Surgeon: Leandrew Koyanagi, MD;  Location: [facility];  Service: Ophthalmology;  Laterality: Left;   CATARACT EXTRACTION W/PHACO Right 09/30/2019   Procedure: CATARACT EXTRACTION PHACO AND INTRAOCULAR LENS PLACEMENT (IOC) RIGHT 4.05  00:44.4  9.1% ;  Surgeon: Leandrew Koyanagi, MD;  Location: [facility];  Service:  Ophthalmology;  Laterality: Right;   COLONOSCOPY  15 years ago   COLONOSCOPY WITH PROPOFOL N/A 11/10/2018   Procedure: COLONOSCOPY WITH PROPOFOL;  Surgeon: Lin Landsman, MD;  Location: [facility] ENDOSCOPY;  Service: Gastroenterology;  Laterality: N/A;   EYE SURGERY     TONSILLECTOMY      Family Psychiatric History: Please see initial evaluation for full details. I have reviewed the history. No updates at this time.     Family History:  Family History  Problem Relation Age of Onset   Lymphoma Mother        Non Hodgkin   Dementia Mother    Alzheimer's disease Mother    Lymphoma Sister        Non Hodgkin   Depression Sister    Heart attack Father    Alcohol abuse Father    Dementia Brother    Dementia Maternal Uncle    Alzheimer's disease Maternal Aunt    Alzheimer's disease Paternal Aunt    Alzheimer's disease Paternal Grandmother    Alzheimer's disease Maternal Aunt    Alzheimer's disease Paternal Aunt     Social History:  Social History   Socioeconomic History   Marital status: Widowed    Spouse name: Not on file   Number of children: Not on file   Years of education: Not on file   Highest education level: Some college, no degree  Occupational History   Occupation: retired  Tobacco Use   Smoking status: Every Day    Packs/day: 1.00    Years: 45.00    Total pack years: 45.00    Types: Cigarettes   Smokeless tobacco: Never  Vaping Use   Vaping Use: Never used  Substance and Sexual Activity   Alcohol use: No    Alcohol/week: 0.0 standard drinks of alcohol   Drug use: No   Sexual activity: Not Currently  Other Topics Concern   Not on file  Social History Narrative   Goes to dinner with friends often, goes to see her mom at nursing home on a regular basis .         Pt plans on taking a trip to [place] 05/2022.   Social Determinants of Health   Financial Resource Strain: Low Risk  (11/16/2021)   Overall Financial Resource Strain (CARDIA)    Difficulty of  Paying Living Expenses: Not hard at all  Food Insecurity: No Food Insecurity (11/16/2021)   Hunger Vital Sign    Worried About Running Out of Food in the Last Year: Never true    Ran Out of Food in the Last Year: Never true  Transportation Needs: No Transportation Needs (11/16/2021)   PRAPARE - Hydrologist (Medical): No    Lack of Transportation (Non-Medical): No  Physical Activity: Inactive (11/16/2021)   Exercise Vital Sign    Days of Exercise per Week: 0 days  Minutes of Exercise per Session: 0 min  Stress: Stress Concern Present (11/16/2021)   [place]    Feeling of Stress : To some extent  Social Connections: Socially Isolated (11/16/2021)   Social Connection and Isolation Panel [NHANES]    Frequency of Communication with Friends and Family: Twice a week    Frequency of Social Gatherings with Friends and Family: Never    Attends Religious Services: Never    Marine scientist or Organizations: No    Attends Archivist Meetings: Never    Marital Status: Widowed    Allergies: No Known Allergies  Metabolic Disorder Labs: Lab Results  Component Value Date   HGBA1C 5.6 01/08/2022   MPG 114 01/08/2022   MPG 105 01/09/2021   No results found for: "PROLACTIN" Lab Results  Component Value Date   CHOL 267 (H) 01/08/2022   TRIG 167 (H) 01/08/2022   HDL 62 01/08/2022   CHOLHDL 4.3 01/08/2022   VLDL 27 11/05/2016   LDLCALC 173 (H) 01/08/2022   LDLCALC 159 (H) 01/09/2021   Lab Results  Component Value Date   TSH 2.30 01/08/2022   TSH 3.98 01/09/2021    Therapeutic Level Labs: Lab Results  Component Value Date   LITHIUM 0.7 04/13/2021   No results found for: "VALPROATE" No results found for: "CBMZ"  Current Medications: Current Outpatient Medications  Medication Sig Dispense Refill   albuterol (PROVENTIL) (2.5 MG/3ML) 0.083% nebulizer solution Take 3 mLs (2.5 mg  total) by nebulization every 6 (six) hours as needed for wheezing or shortness of breath. 60 mL 1   ARIPiprazole (ABILIFY) 5 MG tablet Take 1 tablet (5 mg total) by mouth daily. 30 tablet 1   gabapentin (NEURONTIN) 300 MG capsule TAKE 1 CAPSULE BY MOUTH TWICE DAILY 180 capsule 1   hydrOXYzine (ATARAX/VISTARIL) 10 MG tablet Take 1 tablet (10 mg total) by mouth 3 (three) times daily as needed for anxiety. 270 tablet 3   rosuvastatin (CRESTOR) 5 MG tablet Take 1 tablet (5 mg total) by mouth at bedtime. 90 tablet 3   SPIRIVA HANDIHALER 18 MCG inhalation capsule Place 1 capsule (18 mcg total) into inhaler and inhale daily. 90 capsule 1   venlafaxine XR (EFFEXOR-XR) 150 MG 24 hr capsule Take total of 225 mg daily, along with 75 mg cap 90 capsule 3   venlafaxine XR (EFFEXOR-XR) 75 MG 24 hr capsule Take total of 225 mg daily, along with 75 mg cap 90 capsule 3   No current facility-administered medications for this visit.     Musculoskeletal: Strength & Muscle Tone:  N/A Gait & Station:  N/A Patient leans: N/A  Psychiatric Specialty Exam: Review of Systems  Psychiatric/Behavioral:  Positive for dysphoric mood. Negative for agitation, behavioral problems, confusion, decreased concentration, hallucinations, self-injury, sleep disturbance and suicidal ideas. The patient is nervous/anxious. The patient is not hyperactive.   All other systems reviewed and are negative.   There were no vitals taken for this visit.There is no height or weight on file to calculate BMI.  General Appearance: Fairly Groomed  Eye Contact:  Good  Speech:  Clear and Coherent  Volume:  Normal  Mood:   good  Affect:  Appropriate, Congruent, and down at times  Thought Process:  Coherent  Orientation:  Full (Time, Place, and Person)  Thought Content: Logical   Suicidal Thoughts:  No  Homicidal Thoughts:  No  Memory:  Immediate;   Good  Judgement:  Good  Insight:  Good  Psychomotor Activity:  Normal  Concentration:   Concentration: Good and Attention Span: Good  Recall:  Good  Fund of Knowledge: Good  Language: Good  Akathisia:  No  Handed:  Right  AIMS (if indicated): not done  Assets:  Communication Skills Desire for Improvement  ADL's:  Intact  Cognition: WNL  Sleep:  Good   Screenings: GAD-7    Flowsheet Row Office Visit from 01/16/2022 in [facility]  Total GAD-7 Score 5 3      PHQ2-9    [place] Office Visit from 01/16/2022 in [facility] Office Visit from 10/26/2021 in [facility] [facility] Video Visit from 06/23/2021 in [facility] Office Visit from 04/24/2021 in [facility] Office Visit from 01/16/2021 in [facility]  PHQ-2 Total Score '2 3 1 1 2  '$ PHQ-9 Total Score 7 15 -- -- [place] Office Visit from 10/26/2021 in [facility] Video Visit from 06/23/2021 in [place] [facility] Office Visit from 04/24/2021 in [facility] No Risk No Risk No Risk        Assessment and Plan:  Mackenzie Ward is a 76 y.o. year old female with a history of depression, COPD, hyperlipidemia, chronic low back pain, who presents for follow up appointment for below.   1. MDD (major depressive disorder), recurrent episode, mild ([place]) 2. Anxiety state R/o bipolar II disorder She continues to report depressed mood and anhedonia, although there has been overall improvement since switching from quetiapine to Abilify.  Psychosocial stressors includes loss of her husband in July, chronic pain.  Will uptitrate Abilify to optimize treatment for depression.  Discussed potential metabolic side effect and EPS.  Will continue venlafaxine to target depression.  Will continue gabapentin to target anxiety and neuropathic pain.  Will  continue hydroxyzine as needed for anxiety; she is aware of her using it judiciously due to risk of falls and confusion.    # Cognitive deficit She reports occasional word-finding difficulty.  IADL is independent.  Although she was advised to obtain labs, she was not interested in this .  Will continue to monitor.    Plan   Continue venlafaxine 225 mg daily Increase Abilify 5 mg at night  Continue gabapentin 300 mg three times a day  Continue hydroxyzine 10 mg three times a day as needed for anxiety Next appointment: 7/28 at 11 AM for 30 mins, video [email or web address]. [phone] (sister)     Past trials of medication- quetiapine    The patient demonstrates the following risk factors for suicide: Chronic risk factors for suicide include: psychiatric disorder of bipolar disorder and chronic pain. Acute risk factors for suicide include: unemployment. Protective factors for this patient include: positive social support, responsibility to others (children, family) and hope for the future. Considering these factors, the overall suicide risk at this point appears to be low. Patient is appropriate for outpatient follow up.          Collaboration of Care: Collaboration of Care: Other N/A  Patient/Guardian was advised Release of Information must be obtained prior to any record release in order to collaborate their care with an outside provider. Patient/Guardian was advised if they have not already done so to contact the registration department to sign all necessary forms in order for [place] to release information  regarding their care.   Consent: Patient/Guardian gives verbal consent for treatment and assignment of benefits for services provided during this visit. Patient/Guardian expressed understanding and agreed to proceed.    Norman Clay, MD 02/28/2022, 3:38 PM

## 2022-02-28 ENCOUNTER — Telehealth (INDEPENDENT_AMBULATORY_CARE_PROVIDER_SITE_OTHER): Payer: Medicare PPO | Admitting: Psychiatry

## 2022-02-28 ENCOUNTER — Encounter: Payer: Self-pay | Admitting: Psychiatry

## 2022-02-28 DIAGNOSIS — F411 Generalized anxiety disorder: Secondary | ICD-10-CM | POA: Diagnosis not present

## 2022-02-28 DIAGNOSIS — F330 Major depressive disorder, recurrent, mild: Secondary | ICD-10-CM

## 2022-02-28 MED ORDER — ARIPIPRAZOLE 5 MG PO TABS: 5.0000 mg | ORAL_TABLET | Freq: Every day | ORAL | 1 refills | Status: DC

## 2022-02-28 NOTE — Patient Instructions (Addendum)
Continue venlafaxine 225 mg daily Increase Abilify 5 mg at night  Continue gabapentin 300 mg three times a day  Continue hydroxyzine 10 mg three times a day as needed for anxiety Next appointment: 7/28 at 11 AM, video

## 2022-03-19 ENCOUNTER — Other Ambulatory Visit: Payer: Self-pay

## 2022-03-19 ENCOUNTER — Emergency Department: Payer: Medicare PPO

## 2022-03-19 ENCOUNTER — Inpatient Hospital Stay
Admission: EM | Admit: 2022-03-19 | Discharge: 2022-03-23 | DRG: 291 | Disposition: A | Discharge status: Home / Self Care | Payer: Medicare PPO | Attending: Internal Medicine | Admitting: Internal Medicine

## 2022-03-19 DIAGNOSIS — R000 Tachycardia, unspecified: Secondary | ICD-10-CM | POA: Diagnosis not present

## 2022-03-19 DIAGNOSIS — Z811 Family history of alcohol abuse and dependence: Secondary | ICD-10-CM

## 2022-03-19 DIAGNOSIS — I5031 Acute diastolic (congestive) heart failure: Secondary | ICD-10-CM

## 2022-03-19 DIAGNOSIS — M7989 Other specified soft tissue disorders: Secondary | ICD-10-CM | POA: Diagnosis not present

## 2022-03-19 DIAGNOSIS — J9811 Atelectasis: Secondary | ICD-10-CM | POA: Diagnosis not present

## 2022-03-19 DIAGNOSIS — F32A Depression, unspecified: Secondary | ICD-10-CM | POA: Diagnosis not present

## 2022-03-19 DIAGNOSIS — Z66 Do not resuscitate: Secondary | ICD-10-CM | POA: Diagnosis present

## 2022-03-19 DIAGNOSIS — R600 Localized edema: Secondary | ICD-10-CM | POA: Diagnosis not present

## 2022-03-19 DIAGNOSIS — J811 Chronic pulmonary edema: Secondary | ICD-10-CM | POA: Diagnosis not present

## 2022-03-19 DIAGNOSIS — J90 Pleural effusion, not elsewhere classified: Secondary | ICD-10-CM | POA: Diagnosis not present

## 2022-03-19 DIAGNOSIS — F3170 Bipolar disorder, currently in remission, most recent episode unspecified: Secondary | ICD-10-CM | POA: Diagnosis present

## 2022-03-19 DIAGNOSIS — G8929 Other chronic pain: Secondary | ICD-10-CM | POA: Diagnosis present

## 2022-03-19 DIAGNOSIS — E876 Hypokalemia: Secondary | ICD-10-CM | POA: Diagnosis not present

## 2022-03-19 DIAGNOSIS — E669 Obesity, unspecified: Secondary | ICD-10-CM

## 2022-03-19 DIAGNOSIS — J432 Centrilobular emphysema: Secondary | ICD-10-CM | POA: Diagnosis present

## 2022-03-19 DIAGNOSIS — I2693 Single subsegmental pulmonary embolism without acute cor pulmonale: Secondary | ICD-10-CM | POA: Diagnosis not present

## 2022-03-19 DIAGNOSIS — Z807 Family history of other malignant neoplasms of lymphoid, hematopoietic and related tissues: Secondary | ICD-10-CM | POA: Diagnosis not present

## 2022-03-19 DIAGNOSIS — E7800 Pure hypercholesterolemia, unspecified: Secondary | ICD-10-CM | POA: Diagnosis not present

## 2022-03-19 DIAGNOSIS — J449 Chronic obstructive pulmonary disease, unspecified: Secondary | ICD-10-CM | POA: Diagnosis not present

## 2022-03-19 DIAGNOSIS — Z79899 Other long term (current) drug therapy: Secondary | ICD-10-CM | POA: Diagnosis not present

## 2022-03-19 DIAGNOSIS — J9601 Acute respiratory failure with hypoxia: Secondary | ICD-10-CM | POA: Diagnosis not present

## 2022-03-19 DIAGNOSIS — D75838 Other thrombocytosis: Secondary | ICD-10-CM | POA: Diagnosis present

## 2022-03-19 DIAGNOSIS — Z20822 Contact with and (suspected) exposure to covid-19: Secondary | ICD-10-CM | POA: Diagnosis not present

## 2022-03-19 DIAGNOSIS — I2510 Atherosclerotic heart disease of native coronary artery without angina pectoris: Secondary | ICD-10-CM | POA: Diagnosis present

## 2022-03-19 DIAGNOSIS — I5033 Acute on chronic diastolic (congestive) heart failure: Principal | ICD-10-CM

## 2022-03-19 DIAGNOSIS — Z8249 Family history of ischemic heart disease and other diseases of the circulatory system: Secondary | ICD-10-CM

## 2022-03-19 DIAGNOSIS — Z818 Family history of other mental and behavioral disorders: Secondary | ICD-10-CM

## 2022-03-19 DIAGNOSIS — Z820 Family history of epilepsy and other diseases of the nervous system: Secondary | ICD-10-CM | POA: Diagnosis not present

## 2022-03-19 DIAGNOSIS — R42 Dizziness and giddiness: Secondary | ICD-10-CM | POA: Diagnosis not present

## 2022-03-19 DIAGNOSIS — F17210 Nicotine dependence, cigarettes, uncomplicated: Secondary | ICD-10-CM | POA: Diagnosis not present

## 2022-03-19 DIAGNOSIS — E785 Hyperlipidemia, unspecified: Secondary | ICD-10-CM | POA: Diagnosis not present

## 2022-03-19 DIAGNOSIS — Z720 Tobacco use: Secondary | ICD-10-CM | POA: Diagnosis present

## 2022-03-19 DIAGNOSIS — R0602 Shortness of breath: Secondary | ICD-10-CM | POA: Diagnosis present

## 2022-03-19 DIAGNOSIS — I509 Heart failure, unspecified: Secondary | ICD-10-CM

## 2022-03-19 LAB — CBC WITH DIFFERENTIAL/PLATELET
Abs Immature Granulocytes: 0.04 10*3/uL (ref 0.00–0.07)
Basophils Absolute: 0 10*3/uL (ref 0.0–0.1)
Basophils Relative: 0 %
Eosinophils Absolute: 0.1 10*3/uL (ref 0.0–0.5)
Eosinophils Relative: 1 %
HCT: 39.9 % (ref 36.0–46.0)
Hemoglobin: 12.3 g/dL (ref 12.0–15.0)
Immature Granulocytes: 1 %
Lymphocytes Relative: 25 %
Lymphs Abs: 1.9 10*3/uL (ref 0.7–4.0)
MCH: 27.1 pg (ref 26.0–34.0)
MCHC: 30.8 g/dL (ref 30.0–36.0)
MCV: 87.9 fL (ref 80.0–100.0)
Monocytes Absolute: 0.8 10*3/uL (ref 0.1–1.0)
Monocytes Relative: 10 %
Neutro Abs: 5 10*3/uL (ref 1.7–7.7)
Neutrophils Relative %: 63 %
Platelets: 443 10*3/uL — ABNORMAL HIGH (ref 150–400)
RBC: 4.54 MIL/uL (ref 3.87–5.11)
RDW: 15.9 % — ABNORMAL HIGH (ref 11.5–15.5)
WBC: 7.8 10*3/uL (ref 4.0–10.5)
nRBC: 0 % (ref 0.0–0.2)

## 2022-03-19 LAB — BASIC METABOLIC PANEL
Anion gap: 11 (ref 5–15)
BUN: 9 mg/dL (ref 8–23)
CO2: 27 mmol/L (ref 22–32)
Calcium: 8.3 mg/dL — ABNORMAL LOW (ref 8.9–10.3)
Chloride: 100 mmol/L (ref 98–111)
Creatinine, Ser: 0.73 mg/dL (ref 0.44–1.00)
GFR, Estimated: >60 mL/min (ref >60)
Glucose, Bld: 81 mg/dL (ref 70–99)
Potassium: 4.6 mmol/L (ref 3.5–5.1)
Sodium: 138 mmol/L (ref 135–145)

## 2022-03-19 LAB — TROPONIN I (HIGH SENSITIVITY): Troponin I (High Sensitivity): 66 ng/L — ABNORMAL HIGH (ref <18)

## 2022-03-19 LAB — BRAIN NATRIURETIC PEPTIDE: B Natriuretic Peptide: 855.4 pg/mL — ABNORMAL HIGH (ref 0.0–100.0)

## 2022-03-19 LAB — SARS CORONAVIRUS 2 BY RT PCR: SARS Coronavirus 2 by RT PCR: NEGATIVE

## 2022-03-19 MED ORDER — ACETAMINOPHEN 325 MG PO TABS
650.0000 mg | ORAL_TABLET | Freq: Four times a day (QID) | ORAL | Status: DC | PRN
  Filled 2022-03-19: qty 2

## 2022-03-19 MED ORDER — ENOXAPARIN SODIUM 40 MG/0.4ML IJ SOSY
40.0000 mg | PREFILLED_SYRINGE | INTRAMUSCULAR | Status: DC
  Administered 2022-03-20 – 2022-03-23 (×4): 40 mg via SUBCUTANEOUS
  Filled 2022-03-19 (×4): qty 0.4

## 2022-03-19 MED ORDER — GABAPENTIN 300 MG PO CAPS
300.0000 mg | ORAL_CAPSULE | Freq: Two times a day (BID) | ORAL | Status: DC
  Administered 2022-03-20 – 2022-03-23 (×8): 300 mg via ORAL
  Filled 2022-03-19 (×8): qty 1

## 2022-03-19 MED ORDER — IOHEXOL 350 MG/ML SOLN
75.0000 mL | Freq: Once | INTRAVENOUS | Status: AC | PRN
  Administered 2022-03-19: 75 mL via INTRAVENOUS

## 2022-03-19 MED ORDER — HYDROXYZINE HCL 10 MG PO TABS
10.0000 mg | ORAL_TABLET | Freq: Three times a day (TID) | ORAL | Status: DC | PRN
Start: 2022-03-19 — End: 2022-03-23

## 2022-03-19 MED ORDER — ONDANSETRON HCL 4 MG PO TABS: 4.0000 mg | ORAL_TABLET | Freq: Four times a day (QID) | ORAL | Status: DC | PRN

## 2022-03-19 MED ORDER — ONDANSETRON HCL 4 MG/2ML IJ SOLN: 4.0000 mg | Freq: Four times a day (QID) | INTRAMUSCULAR | Status: DC | PRN

## 2022-03-19 MED ORDER — ALBUTEROL SULFATE (2.5 MG/3ML) 0.083% IN NEBU: 2.5000 mg | INHALATION_SOLUTION | Freq: Four times a day (QID) | RESPIRATORY_TRACT | Status: DC | PRN

## 2022-03-19 MED ORDER — FUROSEMIDE 10 MG/ML IJ SOLN
40.0000 mg | Freq: Two times a day (BID) | INTRAMUSCULAR | Status: DC
  Administered 2022-03-19 – 2022-03-20 (×3): 40 mg via INTRAVENOUS
  Filled 2022-03-19 (×3): qty 4

## 2022-03-19 MED ORDER — VENLAFAXINE HCL ER 75 MG PO CP24
225.0000 mg | ORAL_CAPSULE | Freq: Every day | ORAL | Status: DC
  Administered 2022-03-20 – 2022-03-23 (×4): 225 mg via ORAL
  Filled 2022-03-19 (×4): qty 3

## 2022-03-19 MED ORDER — ARIPIPRAZOLE 5 MG PO TABS
5.0000 mg | ORAL_TABLET | Freq: Every day | ORAL | Status: DC
  Administered 2022-03-20 – 2022-03-23 (×4): 5 mg via ORAL
  Filled 2022-03-19 (×5): qty 1

## 2022-03-19 MED ORDER — TIOTROPIUM BROMIDE MONOHYDRATE 18 MCG IN CAPS
1.0000 | ORAL_CAPSULE | Freq: Every day | RESPIRATORY_TRACT | Status: DC
Start: 2022-03-21 — End: 2022-03-23
  Administered 2022-03-21 – 2022-03-23 (×3): 18 ug via RESPIRATORY_TRACT
  Filled 2022-03-19 (×2): qty 5

## 2022-03-19 MED ORDER — ROSUVASTATIN CALCIUM 5 MG PO TABS
5.0000 mg | ORAL_TABLET | Freq: Every day | ORAL | Status: DC
  Administered 2022-03-20: 5 mg via ORAL
  Filled 2022-03-19: qty 1

## 2022-03-19 MED ORDER — ACETAMINOPHEN 650 MG RE SUPP: 650.0000 mg | Freq: Four times a day (QID) | RECTAL | Status: DC | PRN

## 2022-03-19 MED ORDER — MAGNESIUM HYDROXIDE 400 MG/5ML PO SUSP: 30.0000 mL | Freq: Every day | ORAL | Status: DC | PRN

## 2022-03-19 MED ORDER — TRAZODONE HCL 50 MG PO TABS: 25.0000 mg | ORAL_TABLET | Freq: Every evening | ORAL | Status: DC | PRN

## 2022-03-19 NOTE — H&P (Incomplete)
[place]   PATIENT NAME: Mackenzie Ward    MR#:  [id number]  DATE OF BIRTH:  21-Jun-1946  DATE OF ADMISSION:  03/19/2022  PRIMARY CARE PHYSICIAN: Olin Hauser, DO   Patient is coming from: Home  REQUESTING/REFERRING PHYSICIAN: Marjean Donna, MD  CHIEF COMPLAINT:   Chief Complaint  Patient presents with   Shortness of Breath   Leg Swelling    HISTORY OF PRESENT ILLNESS:  Mackenzie Ward is a 76 y.o. female with medical history significant for asthma and COPD as well as depression and dyslipidemia, presented to the emergency room with a [place] of worsening dyspnea with associated orthopnea since last Wednesday as well as dry cough and wheezing.  She admitted to dyspnea on exertion and had denied any paroxysmal nocturnal dyspnea.  She continues to smoke 1 pack of cigarettes per day.  No fever or chills.  No chest pain or palpitations.  She was noted to be hypoxic with pulse oximetry of 89% on room air.  No dysuria, oliguria or hematuria or flank pain.  ED Course: Upon presentation to the emergency room, heart rate was 106 with otherwise normal vital signs.  Labs revealed an alkaline phosphatase of 140 with albumin of 2.6 and total bili of 6.4 otherwise unremarkable CMP.  BNP was elevated to 855.4 and high-sensitivity troponin I was 66.  CBC showed thrombocytosis of 443 and was otherwise unremarkable.  COVID-19 PCR came back negative.   EKG as reviewed by me : EKG showed sinus tachycardia with a rate of 108 with left posterior fascicular block and Q waves inferiorly with T wave inversion. Imaging: Two-view chest x-ray showed cardiomegaly and pulm vascular congestion with suspected normal pulmonary edema and COPD changes with right pleural effusion and basilar atelectasis, aortic atherosclerosis and emphysema.  The patient was given 40 mg of IV Lasix.  She will be admitted to a cardiac telemetry bed for further evaluation and management. PAST MEDICAL HISTORY:    Past Medical History:  Diagnosis Date   Asthma    COPD (chronic obstructive pulmonary disease) ([place])    Depression    High cholesterol     PAST SURGICAL HISTORY:   Past Surgical History:  Procedure Laterality Date   BREAST SURGERY Right 2013   CATARACT EXTRACTION W/PHACO Left 08/26/2019   Procedure: CATARACT EXTRACTION PHACO AND INTRAOCULAR LENS PLACEMENT (IOC) LEFT  4.45    00:40.5    11.0%;  Surgeon: Leandrew Koyanagi, MD;  Location: [place];  Service: Ophthalmology;  Laterality: Left;   CATARACT EXTRACTION W/PHACO Right 09/30/2019   Procedure: CATARACT EXTRACTION PHACO AND INTRAOCULAR LENS PLACEMENT (IOC) RIGHT 4.05  00:44.4  9.1% ;  Surgeon: Leandrew Koyanagi, MD;  Location: [place];  Service: Ophthalmology;  Laterality: Right;   COLONOSCOPY  15 years ago   COLONOSCOPY WITH PROPOFOL N/A 11/10/2018   Procedure: COLONOSCOPY WITH PROPOFOL;  Surgeon: Lin Landsman, MD;  Location: [facility] ENDOSCOPY;  Service: Gastroenterology;  Laterality: N/A;   EYE SURGERY     TONSILLECTOMY      SOCIAL HISTORY:   Social History   Tobacco Use   Smoking status: Every Day    Packs/day: 1.00    Years: 45.00    Total pack years: 45.00    Types: Cigarettes   Smokeless tobacco: Never  Substance Use Topics   Alcohol use: No    Alcohol/week: 0.0 standard drinks of alcohol    FAMILY HISTORY:   Family History  Problem Relation Age  of Onset   Lymphoma Mother        Non Hodgkin   Dementia Mother    Alzheimer's disease Mother    Lymphoma Sister        Non Hodgkin   Depression Sister    Heart attack Father    Alcohol abuse Father    Dementia Brother    Dementia Maternal Uncle    Alzheimer's disease Maternal Aunt    Alzheimer's disease Paternal Aunt    Alzheimer's disease Paternal Grandmother    Alzheimer's disease Maternal Aunt    Alzheimer's disease Paternal Aunt     DRUG ALLERGIES:  No Known Allergies  REVIEW OF SYSTEMS:   ROS As per history of  present illness. All pertinent systems were reviewed above. Constitutional, HEENT, cardiovascular, respiratory, GI, GU, musculoskeletal, neuro, psychiatric, endocrine, integumentary and hematologic systems were reviewed and are otherwise negative/unremarkable except for positive findings mentioned above in the HPI.   MEDICATIONS AT HOME:   Prior to Admission medications   Medication Sig Start Date End Date Taking? Authorizing Provider  albuterol (PROVENTIL) (2.5 MG/3ML) 0.083% nebulizer solution Take 3 mLs (2.5 mg total) by nebulization every 6 (six) hours as needed for wheezing or shortness of breath. 05/27/18   Mikey College, NP  ARIPiprazole (ABILIFY) 5 MG tablet Take 1 tablet (5 mg total) by mouth daily. 02/28/22 04/29/22  Norman Clay, MD  gabapentin (NEURONTIN) 300 MG capsule TAKE 1 CAPSULE BY MOUTH TWICE DAILY 01/09/22   Parks Ranger, Devonne Doughty, DO  hydrOXYzine (ATARAX/VISTARIL) 10 MG tablet Take 1 tablet (10 mg total) by mouth 3 (three) times daily as needed for anxiety. 07/19/21   Karamalegos, Devonne Doughty, DO  rosuvastatin (CRESTOR) 5 MG tablet Take 1 tablet (5 mg total) by mouth at bedtime. 01/16/22   Karamalegos, Devonne Doughty, DO  SPIRIVA HANDIHALER 18 MCG inhalation capsule Place 1 capsule (18 mcg total) into inhaler and inhale daily. 06/26/21   Parks Ranger, Devonne Doughty, DO  venlafaxine XR (EFFEXOR-XR) 150 MG 24 hr capsule Take total of 225 mg daily, along with 75 mg cap 07/19/21   Karamalegos, Alexander J, DO  venlafaxine XR (EFFEXOR-XR) 75 MG 24 hr capsule Take total of 225 mg daily, along with 75 mg cap 07/19/21   Karamalegos, Alexander J, DO      VITAL SIGNS:  Blood pressure 140/77, pulse 70, temperature 98 F (36.7 C), temperature source Oral, resp. rate (!) 24, height '4\' 10"'$  (1.473 m), weight 74.4 kg, SpO2 90 %.  PHYSICAL EXAMINATION:  Physical Exam  GENERAL:  76 y.o.-year-old patient lying in the bed with mild respiratory distress with conversational dyspnea.   EYES:  Pupils equal, round, reactive to light and accommodation. No scleral icterus. Extraocular muscles intact.  HEENT: Head atraumatic, normocephalic. Oropharynx and nasopharynx clear.  NECK:  Supple, no jugular venous distention. No thyroid enlargement, no tenderness.  LUNGS: Diminished bibasilar breath sounds with bibasal rales.. No use of accessory muscles of respiration.  CARDIOVASCULAR: Regular rate and rhythm, S1, S2 normal. No murmurs, rubs, or gallops.  ABDOMEN: Soft, nondistended, nontender. Bowel sounds present. No organomegaly or mass.  EXTREMITIES: No pedal edema, cyanosis, or clubbing.  NEUROLOGIC: Cranial nerves II through XII are intact. Muscle strength 5/5 in all extremities. Sensation intact. Gait not checked.  PSYCHIATRIC: The patient is alert and oriented x 3.  Normal affect and good eye contact. SKIN: No obvious rash, lesion, or ulcer.   LABORATORY PANEL:   CBC Recent Labs  Lab 03/19/22 1801  WBC 7.8  HGB  12.3  HCT 39.9  PLT 443*   ------------------------------------------------------------------------------------------------------------------  Chemistries  Recent Labs  Lab 03/19/22 1801  NA 138  K 4.6  CL 100  CO2 27  GLUCOSE 81  BUN 9  CREATININE 0.73  CALCIUM 8.3*  AST 28  ALT 26  ALKPHOS 140*  BILITOT 0.5   ------------------------------------------------------------------------------------------------------------------  Cardiac Enzymes No results for input(s): "TROPONINI" in the last 168 hours. ------------------------------------------------------------------------------------------------------------------  RADIOLOGY:  CT Angio Chest PE W and/or Wo Contrast  Result Date: 03/20/2022 CLINICAL DATA:  Pulmonary embolism (PE) suspected, high prob EXAM: CT ANGIOGRAPHY CHEST WITH CONTRAST TECHNIQUE: Multidetector CT imaging of the chest was performed using the standard protocol during bolus administration of intravenous contrast. Multiplanar CT image  reconstructions and MIPs were obtained to evaluate the vascular anatomy. RADIATION DOSE REDUCTION: This exam was performed according to the departmental dose-optimization program which includes automated exposure control, adjustment of the mA and/or kV according to patient size and/or use of iterative reconstruction technique. CONTRAST:  69m OMNIPAQUE IOHEXOL 350 MG/ML SOLN COMPARISON:  01/02/2022 FINDINGS: Cardiovascular: Small filling defect seen within a posterior right lower lobe pulmonary arterial branch on image 79 of series 4 suggesting very small nonocclusive pulmonary embolus. No additional filling defects noted. Heart is normal size. Diffuse coronary artery calcifications. Moderate aortic calcifications. No aneurysm. Mediastinum/Nodes: No mediastinal, hilar, or axillary adenopathy. Trachea and esophagus are unremarkable. Thyroid unremarkable. Lungs/Pleura: Moderate right pleural effusion. Atelectasis in the right middle lobe and right lower lobe. Minimal left base atelectasis with no effusion on the left. Upper Abdomen: Imaging into the upper abdomen demonstrates no acute findings. Musculoskeletal: Chest wall soft tissues are unremarkable. No acute bony abnormality. Review of the MIP images confirms the above findings. IMPRESSION: Very small filling defect within a left lower lobe pulmonary arterial branch compatible with small nonocclusive pulmonary embolus. Moderate right pleural effusion. Right middle lobe and lower lobe atelectasis. Extensive coronary artery disease. Aortic Atherosclerosis (ICD10-I70.0). Electronically Signed   By: KRolm BaptiseM.D.   On: 03/20/2022 00:02   UKoreaVenous Img Lower Bilateral  Result Date: 03/19/2022 CLINICAL DATA:  Leg swelling EXAM: BILATERAL LOWER EXTREMITY VENOUS DOPPLER ULTRASOUND TECHNIQUE: Gray-scale sonography with compression, as well as color and duplex ultrasound, were performed to evaluate the deep venous system(s) from the level of the common femoral vein  through the popliteal and proximal calf veins. COMPARISON:  None Available. FINDINGS: VENOUS Normal compressibility of the common femoral, superficial femoral, and popliteal veins, as well as the visualized calf veins. Visualized portions of profunda femoral vein and great saphenous vein unremarkable. No filling defects to suggest DVT on grayscale or color Doppler imaging. Doppler waveforms show normal direction of venous flow, normal respiratory plasticity and response to augmentation. OTHER None. Limitations: Visualization of the calf veins due to edema. IMPRESSION: No evidence of lower extremity DVT. Electronically Signed   By: KRolm BaptiseM.D.   On: 03/19/2022 23:41   DG Chest 2 View  Result Date: 03/19/2022 CLINICAL DATA:  Increased swelling in legs, shortness of breath, oxygen desaturation EXAM: CHEST - 2 VIEW COMPARISON:  02/16/2016 FINDINGS: Enlargement of cardiac silhouette with pulmonary vascular congestion. Atherosclerotic calcification aorta. RIGHT pleural effusion and basilar atelectasis. Mild interstitial prominence versus previous exam question minimal pulmonary edema. At emphysematous changes and chronic central peribronchial thickening. No pneumothorax or acute osseous findings. IMPRESSION: Enlargement of cardiac silhouette with pulmonary vascular congestion and question minimal pulmonary edema. COPD changes with RIGHT pleural effusion and basilar atelectasis. Aortic Atherosclerosis (ICD10-I70.0). Emphysema (ICD10-J43.9). Electronically  Signed   By: Lavonia Dana M.D.   On: 03/19/2022 18:28      IMPRESSION AND PLAN:  Assessment and Plan: * Acute CHF ([place]) - This is of new onset likely diastolic. - The patient will be admitted to a cardiac telemetry bed. - We will continue diuresis with IV Lasix. - We will follow serial troponins. - 2D echo and cardiology consult will be obtained. - I notified Dr. Nehemiah Massed about the patient.  Acute respiratory failure with hypoxia (HCC) - This is  clearly secondary to her acute CHF. - We will follow pulse oximetry with diuresis.  Chronic obstructive pulmonary disease (COPD) (HCC) - We will continue her inhalers - We will continue with nebulized bronchodilator therapy.  Depression - We will continue Effexor XR and Abilify.  Tobacco abuse - I counseled her for smoking cessation and she will receive further counseling here.   DVT prophylaxis: Lovenox.  Advanced Care Planning:  Code Status: The patient is DNR/DNI.  This was discussed with her.  Family Communication:  The plan of care was discussed in details with the patient (and family). I answered all questions. The patient agreed to proceed with the above mentioned plan. Further management will depend upon hospital course. Disposition Plan: Back to previous home environment Consults called: none.  All the records are reviewed and case discussed with ED provider.  Status is: Inpatient  At the time of the admission, it appears that the appropriate admission status for this patient is inpatient.  This is judged to be reasonable and necessary in order to provide the required intensity of service to ensure the patient's safety given the presenting symptoms, physical exam findings and initial radiographic and laboratory data in the context of comorbid conditions.  The patient requires inpatient status due to high intensity of service, high risk of further deterioration and high frequency of surveillance required.  I certify that at the time of admission, it is my clinical judgment that the patient will require inpatient hospital care extending more than 2 midnights.                            Dispo: The patient is from: Home              Anticipated d/c is to: Home              Patient currently is not medically stable to d/c.              Difficult to place patient: No  Christel Mormon M.D on 03/20/2022 at 2:11 AM  [facility]   From 7 PM-7 AM, contact  night-coverage [email or web address]  CC: Primary care physician; Olin Hauser, DO

## 2022-03-19 NOTE — ED Provider Triage Note (Signed)
Emergency Medicine Provider Triage Evaluation Note  INGER WIEST , a 76 y.o. female was evaluated in triage.  Pt complains of bilateral feet and leg swelling since last Wednesday. Went to urgent care and had EKG and was told to come to ER to "compare" her EKG. She feels SOB. No history of PE/DVT.  Review of Systems  Positive: SOB, leg swelling Negative: Abdominal pain, n/v/d, cp  Physical Exam  There were no vitals taken for this visit. Gen:   Awake, no distress   Resp:  Normal effort  MSK:   Moves extremities without difficulty  Other:    Medical Decision Making  Medically screening exam initiated at 5:48 PM.  Appropriate orders placed.  DARLISHA KELM was informed that the remainder of the evaluation will be completed by another provider, this initial triage assessment does not replace that evaluation, and the importance of remaining in the ED until their evaluation is complete.     Marquette Old, PA-C 03/19/22 1751

## 2022-03-19 NOTE — ED Provider Notes (Signed)
[facility] Provider Note    Event Date/Time   First MD Initiated Contact with Patient 03/19/22 2231     (approximate)   History   Shortness of Breath and Leg Swelling   HPI  Mackenzie Ward is a 76 y.o. female with depression who comes in with concerns for swelling.  Patient's video visit was reviewed on 02/28/2022.  She reports that since Wednesday she has had some increasing swelling in her legs and some shortness of breath.  Denies any chest pain or history of heart attacks.  Does have a history of COPD.  She denies ever having this previously.  No falls or hitting her head.  No history of blood clots.      Physical Exam   Triage Vital Signs: ED Triage Vitals  Enc Vitals Group     BP 03/19/22 1754 120/72     Pulse Rate 03/19/22 1754 (!) 106     Resp 03/19/22 1754 18     Temp 03/19/22 1754 98 F (36.7 C)     Temp Source 03/19/22 1754 Oral     SpO2 03/19/22 1754 90 %     Weight 03/19/22 1755 164 lb (74.4 kg)     Height 03/19/22 1755 '4\' 10"'$  (1.473 m)     Head Circumference --      Peak Flow --      Pain Score 03/19/22 1755 0     Pain Loc --      Pain Edu? --      Excl. in [place]? --     Most recent vital signs: Vitals:   03/19/22 1754  BP: 120/72  Pulse: (!) 106  Resp: 18  Temp: 98 F (36.7 C)  SpO2: 90%     General: Awake, no distress.  CV:  Good peripheral perfusion.  Resp:  Normal effort.  Abd:  No distention.  Nontender Other:  1+ edema in bilateral legs.   ED Results / Procedures / Treatments   Labs (all labs ordered are listed, but only abnormal results are displayed) Labs Reviewed  BASIC METABOLIC PANEL - Abnormal; Notable for the following components:      Result Value   Calcium 8.3 (*)    All other components within normal limits  BRAIN NATRIURETIC PEPTIDE - Abnormal; Notable for the following components:   B Natriuretic Peptide 855.4 (*)    All other components within normal limits  CBC WITH  DIFFERENTIAL/PLATELET - Abnormal; Notable for the following components:   RDW 15.9 (*)    Platelets 443 (*)    All other components within normal limits  TROPONIN I (HIGH SENSITIVITY) - Abnormal; Notable for the following components:   Troponin I (High Sensitivity) 66 (*)    All other components within normal limits  TROPONIN I (HIGH SENSITIVITY)     EKG  My interpretation of EKG:  Sinus tachycardia rate of 108, no ST elevation, T wave inversion in lead III and aVF, normal intervals  RADIOLOGY I have reviewed the xray personally and interpreted and patient has enlarged cardiac silhouette with some questionable edema    PROCEDURES:  Critical Care performed: Yes, see critical care procedure note(s)  .1-3 Lead EKG Interpretation  Performed by: Vanessa [place], MD Authorized by: Vanessa Habersham, MD     Interpretation: abnormal     ECG rate:  105   ECG rate assessment: tachycardic     Rhythm: sinus tachycardia     Ectopy: none  Conduction: normal   .Critical Care  Performed by: Vanessa Shields, MD Authorized by: Vanessa Bland, MD   Critical care provider statement:    Critical care time (minutes):  30   Critical care was necessary to treat or prevent imminent or life-threatening deterioration of the following conditions:  Respiratory failure   Critical care was time spent personally by me on the following activities:  Development of treatment plan with patient or surrogate, discussions with consultants, evaluation of patient's response to treatment, examination of patient, ordering and review of laboratory studies, ordering and review of radiographic studies, ordering and performing treatments and interventions, pulse oximetry, re-evaluation of patient's condition and review of old charts    MEDICATIONS ORDERED IN ED: Medications  iohexol (OMNIPAQUE) 350 MG/ML injection 75 mL (has no administration in time range)     IMPRESSION / MDM / ASSESSMENT AND PLAN / ED COURSE  I  reviewed the triage vital signs and the nursing notes.   Patient's presentation is most consistent with acute presentation with potential threat to life or bodily function.   Patient comes in hypoxic at 89% placed on 2 L.  Differential includes CHF, Pe, pneumonia, COVID.  Labs ordered in triage show slightly elevated troponin and BNP CBC reassuring no anemia or white count elevation.  Given patient is hypoxic we will discuss the hospital team for admission.  I did add on some ultrasounds and CT PE given some S1Q3T3 noted.  Regardless patient will require admission just will depend on if she gets diuresis or not.  Discussed with the hospitalist who will admit patient.  The patient is on the cardiac monitor to evaluate for evidence of arrhythmia and/or significant heart rate changes.      FINAL CLINICAL IMPRESSION(S) / ED DIAGNOSES   Final diagnoses:  Acute respiratory failure with hypoxia ([place])     Rx / DC Orders   ED Discharge Orders     None        Note:  This document was prepared using Dragon voice recognition software and may include unintentional dictation errors.   Vanessa Sobieski, MD 03/19/22 2256

## 2022-03-19 NOTE — ED Triage Notes (Signed)
Pt to ED via POV from UC. Pt reports last Wednesday she noticed increased swelling in her legs. Pt denies hx CHF. Pt also endorses SOB. Pt referred by UC stating to watch for EKG changes. Pt denies CP. Pt RA sats 89% and placed on 2L [place].

## 2022-03-20 ENCOUNTER — Inpatient Hospital Stay (HOSPITAL_COMMUNITY)
Admit: 2022-03-20 | Discharge: 2022-03-20 | Disposition: A | Discharge status: Home / Self Care | Payer: Medicare PPO | Attending: Family Medicine | Admitting: Family Medicine

## 2022-03-20 ENCOUNTER — Encounter: Payer: Self-pay | Admitting: Family Medicine

## 2022-03-20 DIAGNOSIS — E669 Obesity, unspecified: Secondary | ICD-10-CM | POA: Diagnosis not present

## 2022-03-20 DIAGNOSIS — I5031 Acute diastolic (congestive) heart failure: Secondary | ICD-10-CM

## 2022-03-20 DIAGNOSIS — I5033 Acute on chronic diastolic (congestive) heart failure: Secondary | ICD-10-CM | POA: Diagnosis not present

## 2022-03-20 DIAGNOSIS — F32A Depression, unspecified: Secondary | ICD-10-CM

## 2022-03-20 DIAGNOSIS — J9601 Acute respiratory failure with hypoxia: Secondary | ICD-10-CM | POA: Diagnosis not present

## 2022-03-20 DIAGNOSIS — J449 Chronic obstructive pulmonary disease, unspecified: Secondary | ICD-10-CM

## 2022-03-20 LAB — BASIC METABOLIC PANEL
Anion gap: 9 (ref 5–15)
BUN: 8 mg/dL (ref 8–23)
CO2: 30 mmol/L (ref 22–32)
Calcium: 8.1 mg/dL — ABNORMAL LOW (ref 8.9–10.3)
Chloride: 102 mmol/L (ref 98–111)
Creatinine, Ser: 0.64 mg/dL (ref 0.44–1.00)
GFR, Estimated: >60 mL/min (ref >60)
Glucose, Bld: 86 mg/dL (ref 70–99)
Potassium: 4.1 mmol/L (ref 3.5–5.1)
Sodium: 141 mmol/L (ref 135–145)

## 2022-03-20 LAB — CBC
HCT: 40.9 % (ref 36.0–46.0)
Hemoglobin: 12.3 g/dL (ref 12.0–15.0)
MCH: 26.6 pg (ref 26.0–34.0)
MCHC: 30.1 g/dL (ref 30.0–36.0)
MCV: 88.5 fL (ref 80.0–100.0)
Platelets: 414 10*3/uL — ABNORMAL HIGH (ref 150–400)
RBC: 4.62 MIL/uL (ref 3.87–5.11)
RDW: 15.9 % — ABNORMAL HIGH (ref 11.5–15.5)
WBC: 8.2 10*3/uL (ref 4.0–10.5)
nRBC: 0 % (ref 0.0–0.2)

## 2022-03-20 LAB — HEPATIC FUNCTION PANEL
ALT: 26 U/L (ref 0–44)
AST: 28 U/L (ref 15–41)
Albumin: 2.6 g/dL — ABNORMAL LOW (ref 3.5–5.0)
Alkaline Phosphatase: 140 U/L — ABNORMAL HIGH (ref 38–126)
Bilirubin, Direct: 0.1 mg/dL (ref 0.0–0.2)
Indirect Bilirubin: 0.4 mg/dL (ref 0.3–0.9)
Total Bilirubin: 0.5 mg/dL (ref 0.3–1.2)
Total Protein: 6.4 g/dL — ABNORMAL LOW (ref 6.5–8.1)

## 2022-03-20 LAB — TROPONIN I (HIGH SENSITIVITY): Troponin I (High Sensitivity): 59 ng/L — ABNORMAL HIGH (ref <18)

## 2022-03-20 LAB — ECHOCARDIOGRAM COMPLETE
Height: 58 in
S' Lateral: 3.3 cm
Weight: 2624 oz

## 2022-03-20 LAB — LIPASE, BLOOD: Lipase: 32 U/L (ref 11–51)

## 2022-03-20 MED ORDER — ASPIRIN 81 MG PO CHEW
81.0000 mg | CHEWABLE_TABLET | Freq: Every day | ORAL | Status: DC
  Administered 2022-03-20 – 2022-03-23 (×4): 81 mg via ORAL
  Filled 2022-03-20 (×4): qty 1

## 2022-03-20 MED ORDER — NICOTINE 21 MG/24HR TD PT24
21.0000 mg | MEDICATED_PATCH | Freq: Every day | TRANSDERMAL | Status: DC
  Administered 2022-03-20 – 2022-03-23 (×4): 21 mg via TRANSDERMAL
  Filled 2022-03-20 (×4): qty 1

## 2022-03-20 MED ORDER — ROSUVASTATIN CALCIUM 10 MG PO TABS
20.0000 mg | ORAL_TABLET | Freq: Every day | ORAL | Status: DC
  Administered 2022-03-20 – 2022-03-22 (×3): 20 mg via ORAL
  Filled 2022-03-20 (×3): qty 2

## 2022-03-20 NOTE — Assessment & Plan Note (Signed)
I counseled her for smoking cessation and she will receive further counseling here. ?

## 2022-03-20 NOTE — TOC Initial Note (Signed)
Transition of Care [facility]) - Initial/Assessment Note    Patient Details  Name: Mackenzie Ward MRN: [id number] Date of Birth: 03/27/1946  Transition of Care [facility]) CM/SW Contact:    Beverly Sessions, RN Phone Number: 03/20/2022, 2:59 PM  Clinical Narrative:                  [facility] consulted for heart failure screen. Heart failure RN Delmar Landau has been informed.   PT eval pending         Patient Goals and CMS Choice        Expected Discharge Plan and Services                                                Prior Living Arrangements/Services                       Activities of Daily Living      Permission Sought/Granted                  Emotional Assessment              Admission diagnosis:  Acute CHF [facility]) [I50.9] Patient Active Problem List   Diagnosis Date Noted   Depression 03/20/2022   Chronic obstructive pulmonary disease (COPD) ([place]) 03/20/2022   Acute respiratory failure with hypoxia ([place]) 03/20/2022   Acute on chronic diastolic CHF (congestive heart failure) ([place]) 03/20/2022   Obesity (BMI 30-39.9) 03/20/2022   Acute CHF ([place]) 03/19/2022   Aortic atherosclerosis ([place]) 01/16/2022   Chronic right-sided low back pain with right-sided sciatica 01/07/2020   Bipolar disorder in remission ([place]) 08/24/2019   History of colonic polyps    Tobacco abuse 10/22/2017   Centrilobular emphysema ([place]) 10/22/2017   Vitamin D deficiency 03/06/2017   Mixed hyperlipidemia 03/06/2017   Gout attack 11/05/2016   Seborrheic keratoses 07/30/2016   Chronic cough 03/06/2016   Fatigue 12/22/2015   Other mixed anxiety disorders 06/20/2015   Chronic fatigue 06/20/2015   Flank pain 06/20/2015   Lipoma of back 06/20/2015   Special screening for malignant neoplasms, colon 01/01/2013   PCP:  Olin Hauser, DO Pharmacy:   [facility], [place] [address] Phone: [phone]  Fax: [place], [place] [address] [place] [address] Phone: [phone] Fax: [id number] [address] [place] [address] Phone: [phone] Fax: [phone]     Social Determinants of Health (SDOH) Interventions    Readmission Risk Interventions     No data to display

## 2022-03-20 NOTE — Assessment & Plan Note (Signed)
-   This is clearly secondary to her acute CHF. - We will follow pulse oximetry with diuresis.

## 2022-03-20 NOTE — Hospital Course (Signed)
Mackenzie Ward is a 76 y.o. female with medical history significant for asthma and COPD as well as depression and dyslipidemia, presented to the emergency room with a  worsening dyspnea with associated orthopnea since last Wednesday as well as dry cough and wheezing.  He continues to smoke cigarettes.  Upon arriving the hospital, her oxygen saturation was 89% on room air, he was placed on 2 L oxygen.  Chest x-ray showed cardiomegaly with pulmonary vascular congestion, COPD changes.  BNP elevated 85 5.  Patient is diagnosed with acute on chronic congestive heart failure.  She is giving IV Lasix. Echocardiogram performed on 7/11 showed ejection fraction 32-67%, diastolic function cannot be evaluated, but the patient has evidence of right-sided volume overload.

## 2022-03-20 NOTE — Progress Notes (Signed)
  Progress Note   Patient: Mackenzie Ward CNO:[id number] DOB: 06-04-1946 DOA: 03/19/2022     1 DOS: the patient was seen and examined on 03/20/2022   Brief hospital course: Mackenzie Ward is a 76 y.o. female with medical history significant for asthma and COPD as well as depression and dyslipidemia, presented to the emergency room with a  worsening dyspnea with associated orthopnea since last Wednesday as well as dry cough and wheezing.  He continues to smoke cigarettes.  Upon arriving the hospital, her oxygen saturation was 89% on room air, he was placed on 2 L oxygen.  Chest x-ray showed cardiomegaly with pulmonary vascular congestion, COPD changes.  BNP elevated 85 5.  Patient is diagnosed with acute on chronic congestive heart failure.  She is giving IV Lasix. Echocardiogram performed on 7/11 showed ejection fraction 29-47%, diastolic function cannot be evaluated, but the patient has evidence of right-sided volume overload.  Assessment and Plan: Acute respiratory failure with hypoxemia. Acute on chronic diastolic congestive heart failure. Patient appears to have a mild volume overload, started on IV Lasix.  Appreciate cardiology consult.  Patient probably will be in the hospital for another day, potential discharge home tomorrow.  Subsegmental PE. I reviewed patient CT scan results, PE only involves one subsegmental area, patient does not have a cancer, duplex ultrasound did not show any bilateral leg DVT.  Patient does not have history of DVT/PE in the past.  The risk of treatment outweighs the benefit.  COPD. Tobacco abuse Stable, no bronchospasm. Advised to quit smoking.     Subjective:  Patient feels better today, short of breath has been improving.   Physical Exam: Vitals:   03/20/22 0500 03/20/22 0600 03/20/22 1108 03/20/22 1307  BP: 91/62 103/68 (!) 107/59 (!) 95/50  Pulse: 99 (!) 102 (!) 108 (!) 109  Resp: '17 19 16 18  '$ Temp:    97.8 F (36.6 C)  TempSrc:    Oral   SpO2: 95% 97% 94% 98%  Weight:      Height:       General exam: Appears calm and comfortable  Respiratory system: Decreased breathing sounds with a few crackles. Respiratory effort normal. Cardiovascular system: S1 & S2 heard, RRR. No JVD, murmurs, rubs, gallops or clicks. No pedal edema. Gastrointestinal system: Abdomen is nondistended, soft and nontender. No organomegaly or masses felt. Normal bowel sounds heard. Central nervous system: Alert and oriented. No focal neurological deficits. Extremities: Symmetric 5 x 5 power. Skin: No rashes, lesions or ulcers Psychiatry: Judgement and insight appear normal. Mood & affect appropriate.   Data Reviewed:  Reviewed  CT chest, lab results  Family Communication:   Disposition: Status is: Inpatient Remains inpatient appropriate because: Severity of disease, IV Lasix  Planned Discharge Destination: Home with Home Health    Time spent: 35 minutes  Author: Sharen Hones, MD 03/20/2022 2:40 PM  For on call review [email or web address].

## 2022-03-20 NOTE — Consult Note (Cosign Needed)
[place] NOTE       Patient ID: AYN DOMANGUE MRN: [id number] DOB/AGE: February 13, 1946 76 y.o.  Admit date: 03/19/2022 Referring Physician Dr. Eugenie Norrie Primary Physician Dr. Nobie Putnam Primary Cardiologist none Reason for Consultation CHF  HPI: Kimberl Vig is a 8yoF with a PMH of COPD, ongoing tobacco use, depression, hyperlipidemia, chronic back pain who presented to [facility] ED 03/19/22 with leg swelling and SOB. Cardiology is consulted for assistance with possible new onset heart failure.   She presents today with her sister who contributes to the history.  She initially presented to urgent care with concern of swelling in her feet up past her knees that was worsening over the past week.  They did an EKG and told her to go to the nearest ER for further evaluation.  Her sister notes she has been more short of breath with exertion over the past week, worse while walking from room to room in her house and inability to lay flat to sleep due to dizziness and inability to breathe also over the past week. She has had chronic dyspnea while walking up the stairs and has sciatica and chronic back pain that also limits her ambulation over the past couple years.  She denies any chest pain, palpitations, presyncope.  She has been given IV Lasix 40 mg x 2 doses with 1.3 L of urine output recorded so far.  She says she feels about the same, although her leg swelling has much improved per her sister.  She has never seen a cardiologist in the past, no history of stress test or heart cath.  Has a family history of heart attack in her father at age 44, and both grandparents have heart disease.  The patient smokes 1 pack a day for the past 56 years, does not drink alcohol nor use illicit substances.  Vitals are notable for a blood pressure of 107/59, heart rate 108 and sinus tach on telemetry, SPO2 94% on 2 L by nasal cannula  Labs are notable for a potassium of 4.1,  BUN/creatinine 8/0.64 GFR greater than 60.  BNP elevated 855, high-sensitivity troponin elevated but flat trending at 66-59.  H&H stable at 12.3/40.9, thrombocytosis platelets 414.  Review of lipid panel from May 2023 shows a TC 267, HDL 62, LDL 173, triglycerides 167.  Chest x-ray with pulmonary vascular congestion and right pleural effusion.  CTA chest with a very small filling defect within a left lower lobe pulmonary artery branch compatible with a small nonocclusive PE.  Moderate right pleural effusion, right middle and lower lobe atelectasis with extensive coronary disease.   Review of systems complete and found to be negative unless listed above    Past Medical History:  Diagnosis Date   Asthma    COPD (chronic obstructive pulmonary disease) ([place])    Depression    High cholesterol     Past Surgical History:  Procedure Laterality Date   BREAST SURGERY Right 2013   CATARACT EXTRACTION W/PHACO Left 08/26/2019   Procedure: CATARACT EXTRACTION PHACO AND INTRAOCULAR LENS PLACEMENT (IOC) LEFT  4.45    00:40.5    11.0%;  Surgeon: Leandrew Koyanagi, MD;  Location: [place];  Service: Ophthalmology;  Laterality: Left;   CATARACT EXTRACTION W/PHACO Right 09/30/2019   Procedure: CATARACT EXTRACTION PHACO AND INTRAOCULAR LENS PLACEMENT (IOC) RIGHT 4.05  00:44.4  9.1% ;  Surgeon: Leandrew Koyanagi, MD;  Location: [place];  Service: Ophthalmology;  Laterality: Right;   COLONOSCOPY  15  years ago   COLONOSCOPY WITH PROPOFOL N/A 11/10/2018   Procedure: COLONOSCOPY WITH PROPOFOL;  Surgeon: Lin Landsman, MD;  Location: [place];  Service: Gastroenterology;  Laterality: N/A;   EYE SURGERY     TONSILLECTOMY      (Not in a hospital admission)  Social History   Socioeconomic History   Marital status: Widowed    Spouse name: Not on file   Number of children: Not on file   Years of education: Not on file   Highest education level: Some college, no degree   Occupational History   Occupation: retired  Tobacco Use   Smoking status: Every Day    Packs/day: 1.00    Years: 45.00    Total pack years: 45.00    Types: Cigarettes   Smokeless tobacco: Never  Vaping Use   Vaping Use: Never used  Substance and Sexual Activity   Alcohol use: No    Alcohol/week: 0.0 standard drinks of alcohol   Drug use: No   Sexual activity: Not Currently  Other Topics Concern   Not on file  Social History Narrative   Goes to dinner with friends often, goes to see her mom at nursing home on a regular basis .         Pt plans on taking a trip to [place] 05/2022.   Social Determinants of Health   Financial Resource Strain: Low Risk  (11/16/2021)   Overall Financial Resource Strain (CARDIA)    Difficulty of Paying Living Expenses: Not hard at all  Food Insecurity: No Food Insecurity (11/16/2021)   Hunger Vital Sign    Worried About Running Out of Food in the Last Year: Never true    Ran Out of Food in the Last Year: Never true  Transportation Needs: No Transportation Needs (11/16/2021)   PRAPARE - Hydrologist (Medical): No    Lack of Transportation (Non-Medical): No  Physical Activity: Inactive (11/16/2021)   Exercise Vital Sign    Days of Exercise per Week: 0 days    Minutes of Exercise per Session: 0 min  Stress: Stress Concern Present (11/16/2021)   [place]    Feeling of Stress : To some extent  Social Connections: Socially Isolated (11/16/2021)   Social Connection and Isolation Panel [NHANES]    Frequency of Communication with Friends and Family: Twice a week    Frequency of Social Gatherings with Friends and Family: Never    Attends Religious Services: Never    Marine scientist or Organizations: No    Attends Archivist Meetings: Never    Marital Status: Widowed  Intimate Partner Violence: Not At Risk (11/16/2021)   Humiliation, Afraid, Rape,  and Kick questionnaire    Fear of Current or Ex-Partner: No    Emotionally Abused: No    Physically Abused: No    Sexually Abused: No    Family History  Problem Relation Age of Onset   Lymphoma Mother        Non Hodgkin   Dementia Mother    Alzheimer's disease Mother    Lymphoma Sister        Non Hodgkin   Depression Sister    Heart attack Father    Alcohol abuse Father    Dementia Brother    Dementia Maternal Uncle    Alzheimer's disease Maternal Aunt    Alzheimer's disease Paternal Aunt    Alzheimer's disease Paternal Grandmother  Alzheimer's disease Maternal Aunt    Alzheimer's disease Paternal Aunt       PHYSICAL EXAM General: Pleasant elderly Caucasian female, well nourished, in no acute distress.  Sitting upright in hospital bed in the ER with her sister at bedside. HEENT:  Normocephalic and atraumatic. Neck:  No JVD.  Lungs: Slight conversational dyspnea on 2 L by [place].  Expiratory wheezes throughout with poor air movement and decreased breath sounds in right lung base Heart: Tachy but regular. Normal S1 and S2 without gallops or murmurs. Radial & DP pulses 2+ bilaterally. Abdomen: Non-distended appearing.  Msk: Normal strength and tone for age. Extremities: Warm and well perfused. No clubbing, cyanosis.  1+ pitting pedal edema.  Neuro: Alert and oriented X 3. Psych:  Answers questions appropriately.   Labs:   Lab Results  Component Value Date   WBC 8.2 03/20/2022   HGB 12.3 03/20/2022   HCT 40.9 03/20/2022   MCV 88.5 03/20/2022   PLT 414 (H) 03/20/2022    Recent Labs  Lab 03/19/22 1801 03/20/22 0500  NA 138 141  K 4.6 4.1  CL 100 102  CO2 27 30  BUN 9 8  CREATININE 0.73 0.64  CALCIUM 8.3* 8.1*  PROT 6.4*  --   BILITOT 0.5  --   ALKPHOS 140*  --   ALT 26  --   AST 28  --   GLUCOSE 81 86   No results found for: "CKTOTAL", "CKMB", "CKMBINDEX", "TROPONINI"  Lab Results  Component Value Date   CHOL 267 (H) 01/08/2022   CHOL 242 (H) 01/09/2021    CHOL 275 (H) 11/27/2018   Lab Results  Component Value Date   HDL 62 01/08/2022   HDL 50 01/09/2021   HDL 56 11/27/2018   Lab Results  Component Value Date   LDLCALC 173 (H) 01/08/2022   LDLCALC 159 (H) 01/09/2021   LDLCALC 178 (H) 11/27/2018   Lab Results  Component Value Date   TRIG 167 (H) 01/08/2022   TRIG 177 (H) 01/09/2021   TRIG 240 (H) 11/27/2018   Lab Results  Component Value Date   CHOLHDL 4.3 01/08/2022   CHOLHDL 4.8 01/09/2021   CHOLHDL 4.9 11/27/2018   No results found for: "LDLDIRECT"    Radiology: ECHOCARDIOGRAM COMPLETE  Result Date: 03/20/2022    ECHOCARDIOGRAM REPORT   Patient Name:   JEARLINE HIRSCHHORN Date of Exam: 03/20/2022 Medical Rec #:  [id number]         Height:       58.0 in Accession #:    [id number]        Weight:       164.0 lb Date of Birth:  [birth year]         BSA:          1.674 m Patient Age:    30 years          BP:           103/68 mmHg Patient Gender: F                 HR:           102 bpm. Exam Location:  [facility] Procedure: 2D Echo, Cardiac Doppler and Color Doppler Indications:     CHF-acute diastolic E95.28  History:         Patient has no prior history of Echocardiogram examinations.                  COPD. High cholesterol.  Sonographer:     Sherrie Sport Referring Phys:  [id number] JAN A MANSY Diagnosing Phys: Harrell Gave End MD  Sonographer Comments: Technically challenging study due to limited acoustic windows and no apical window. IMPRESSIONS  1. Left ventricular ejection fraction, by estimation, is 55 to 60%. The left ventricle has normal function. The left ventricle demonstrates regional wall motion abnormalities (see scoring diagram/findings for description). Left ventricular diastolic function could not be evaluated. There is the interventricular septum is flattened in diastole ('D' shaped left ventricle), consistent with right ventricular volume overload. There is akinesis of the left ventricular, basal inferior segment.  2. Right ventricular  systolic function was not well visualized. The right ventricular size is not well visualized.  3. The mitral valve is abnormal. No evidence of mitral valve regurgitation. Moderate mitral annular calcification.  4. The aortic valve was not well visualized. FINDINGS  Left Ventricle: Left ventricular ejection fraction, by estimation, is 55 to 60%. The left ventricle has normal function. The left ventricle demonstrates regional wall motion abnormalities. The left ventricular internal cavity size was normal in size. There is borderline left ventricular hypertrophy. The interventricular septum is flattened in diastole ('D' shaped left ventricle), consistent with right ventricular volume overload. Left ventricular diastolic function could not be evaluated. Right Ventricle: The right ventricular size is not well visualized. No increase in right ventricular wall thickness. Right ventricular systolic function was not well visualized. Left Atrium: Left atrial size was not well visualized. Right Atrium: Right atrial size was not well visualized. Pericardium: The pericardium was not well visualized. Mitral Valve: The mitral valve is abnormal. Moderate mitral annular calcification. No evidence of mitral valve regurgitation. Tricuspid Valve: The tricuspid valve is not well visualized. Tricuspid valve regurgitation is trivial. Aortic Valve: Poor apical windows limit assessment for aortic regurgitation and stenosis. The aortic valve was not well visualized. Pulmonic Valve: The pulmonic valve was not well visualized. Pulmonic valve regurgitation is not visualized. No evidence of pulmonic stenosis. Aorta: The aortic root is normal in size and structure. Pulmonary Artery: The pulmonary artery is not well seen. Venous: The inferior vena cava was not well visualized. IAS/Shunts: The interatrial septum was not well visualized.  LEFT VENTRICLE PLAX 2D LVIDd:         4.60 cm LVIDs:         3.30 cm LV PW:         1.00 cm LV IVS:        0.90  cm LVOT diam:     2.00 cm LVOT Area:     3.14 cm  LEFT ATRIUM         Index LA diam:    4.20 cm 2.51 cm/m   AORTA Ao Root diam: 3.10 cm TRICUSPID VALVE TR Peak grad:   16.6 mmHg TR Vmax:        204.00 cm/s  SHUNTS Systemic Diam: 2.00 cm Nelva Bush MD Electronically signed by Nelva Bush MD Signature Date/Time: 03/20/2022/11:33:02 AM    Final    CT Angio Chest PE W and/or Wo Contrast  Result Date: 03/20/2022 CLINICAL DATA:  Pulmonary embolism (PE) suspected, high prob EXAM: CT ANGIOGRAPHY CHEST WITH CONTRAST TECHNIQUE: Multidetector CT imaging of the chest was performed using the standard protocol during bolus administration of intravenous contrast. Multiplanar CT image reconstructions and MIPs were obtained to evaluate the vascular anatomy. RADIATION DOSE REDUCTION: This exam was performed according to the departmental dose-optimization program which includes automated exposure control, adjustment of the mA and/or kV according to  patient size and/or use of iterative reconstruction technique. CONTRAST:  41m OMNIPAQUE IOHEXOL 350 MG/ML SOLN COMPARISON:  01/02/2022 FINDINGS: Cardiovascular: Small filling defect seen within a posterior right lower lobe pulmonary arterial branch on image 79 of series 4 suggesting very small nonocclusive pulmonary embolus. No additional filling defects noted. Heart is normal size. Diffuse coronary artery calcifications. Moderate aortic calcifications. No aneurysm. Mediastinum/Nodes: No mediastinal, hilar, or axillary adenopathy. Trachea and esophagus are unremarkable. Thyroid unremarkable. Lungs/Pleura: Moderate right pleural effusion. Atelectasis in the right middle lobe and right lower lobe. Minimal left base atelectasis with no effusion on the left. Upper Abdomen: Imaging into the upper abdomen demonstrates no acute findings. Musculoskeletal: Chest wall soft tissues are unremarkable. No acute bony abnormality. Review of the MIP images confirms the above findings.  IMPRESSION: Very small filling defect within a left lower lobe pulmonary arterial branch compatible with small nonocclusive pulmonary embolus. Moderate right pleural effusion. Right middle lobe and lower lobe atelectasis. Extensive coronary artery disease. Aortic Atherosclerosis (ICD10-I70.0). Electronically Signed   By: KRolm BaptiseM.D.   On: 03/20/2022 00:02   UKoreaVenous Img Lower Bilateral  Result Date: 03/19/2022 CLINICAL DATA:  Leg swelling EXAM: BILATERAL LOWER EXTREMITY VENOUS DOPPLER ULTRASOUND TECHNIQUE: Gray-scale sonography with compression, as well as color and duplex ultrasound, were performed to evaluate the deep venous system(s) from the level of the common femoral vein through the popliteal and proximal calf veins. COMPARISON:  None Available. FINDINGS: VENOUS Normal compressibility of the common femoral, superficial femoral, and popliteal veins, as well as the visualized calf veins. Visualized portions of profunda femoral vein and great saphenous vein unremarkable. No filling defects to suggest DVT on grayscale or color Doppler imaging. Doppler waveforms show normal direction of venous flow, normal respiratory plasticity and response to augmentation. OTHER None. Limitations: Visualization of the calf veins due to edema. IMPRESSION: No evidence of lower extremity DVT. Electronically Signed   By: KRolm BaptiseM.D.   On: 03/19/2022 23:41   DG Chest 2 View  Result Date: 03/19/2022 CLINICAL DATA:  Increased swelling in legs, shortness of breath, oxygen desaturation EXAM: CHEST - 2 VIEW COMPARISON:  02/16/2016 FINDINGS: Enlargement of cardiac silhouette with pulmonary vascular congestion. Atherosclerotic calcification aorta. RIGHT pleural effusion and basilar atelectasis. Mild interstitial prominence versus previous exam question minimal pulmonary edema. At emphysematous changes and chronic central peribronchial thickening. No pneumothorax or acute osseous findings. IMPRESSION: Enlargement of  cardiac silhouette with pulmonary vascular congestion and question minimal pulmonary edema. COPD changes with RIGHT pleural effusion and basilar atelectasis. Aortic Atherosclerosis (ICD10-I70.0). Emphysema (ICD10-J43.9). Electronically Signed   By: MLavonia DanaM.D.   On: 03/19/2022 18:28    ECHO LVEF 55-60%, interventricular septum flattened in diastole c/w RV overload, akinesis of left ventricular, basilar inferior segment  TELEMETRY reviewed by me: Sinus tachycardia with rates in the low 100s to 110  EKG reviewed by me: sinus tach rate 108 T wave inversions in lead III and aVF and flattening anterolaterally  ASSESSMENT AND PLAN:  CObie Silosis a 89yoFwith a PMH of COPD, ongoing tobacco use, depression, hyperlipidemia, chronic back pain [facility] and SOB. Cardiology is consulted for assistance with possible new onset heart failure.   #Acute hypoxic respiratory failure #Newly diagnosed HFpEF (LVEF 55-60%, LV basal inferior wall akinesis) #Hyperlipidemia The patient presents with 1 week of lower extremity swelling up to her knees, orthopnea, and shortness of breath on top of her chronic dyspnea on exertion.  Her  BNP is elevated to 800 with a right pleural effusion and vascular congestion on chest x-ray, and clinical volume overload and her lower legs with a new oxygen requirement. incidentally, she has a inferior basilar wall motion abnormality, she has risk factors for CAD including ongoing tobacco use and hyperlipidemia, with an extensive family history of CAD.  She has never had an ischemic work-up in the outpatient setting.  Also incidentally, a very small left lower lobe filling defect was noted on CTA chest and extensive coronary artery calcifications. -S/p IV Lasix 40 mg x 2 doses with 1.3 L urine output so far -Continue Lasix 40 mg twice daily for now -Start aspirin 81 mg daily -Increase rosuvastatin from '5mg'$  to 20 mg daily -Initiate beta  blocker for GDMT pending clinical course and as BP tolerates -wean oxygen as tolerated, defer management of small PE to primary team -strict I/O, daily weights -discussed low sodium, heart healthy diet -will need outpatient cardiology follow up at discharge for consideration of ischemic evaluation  #COPD #tobacco abuse -Discussed smoking cessation at length with patient, she has tried to quit multiple times in the past (including hypnosis) currently smokes 1 pack/day, possibly eager to cut back but not interested in cessation at this time  This patient's plan of care was discussed and created with Dr. Nehemiah Massed and he is in agreement.  Signed: Tristan Schroeder , PA-C 03/20/2022, 12:45 PM [facility]  The patient has been interviewed and examined. I agree with assessment and plan above. Serafina Royals MD [facility]

## 2022-03-20 NOTE — Progress Notes (Signed)
*  PRELIMINARY RESULTS* Echocardiogram 2D Echocardiogram has been performed.  Mackenzie Ward 03/20/2022, 10:20 AM

## 2022-03-20 NOTE — Assessment & Plan Note (Signed)
-   We will continue Effexor XR and Abilify. 

## 2022-03-20 NOTE — Assessment & Plan Note (Signed)
-   This is of new onset likely diastolic. - The patient will be admitted to a cardiac telemetry bed. - We will continue diuresis with IV Lasix. - We will follow serial troponins. - 2D echo and cardiology consult will be obtained. - I notified Dr. Nehemiah Massed about the patient.

## 2022-03-20 NOTE — Assessment & Plan Note (Addendum)
-   We will continue her inhalers - We will continue with nebulized bronchodilator therapy.

## 2022-03-20 NOTE — ED Notes (Signed)
DNR bracelet placed to right arm.

## 2022-03-21 DIAGNOSIS — D75838 Other thrombocytosis: Secondary | ICD-10-CM

## 2022-03-21 DIAGNOSIS — I5033 Acute on chronic diastolic (congestive) heart failure: Secondary | ICD-10-CM | POA: Diagnosis not present

## 2022-03-21 DIAGNOSIS — I2693 Single subsegmental pulmonary embolism without acute cor pulmonale: Secondary | ICD-10-CM

## 2022-03-21 MED ORDER — FUROSEMIDE 10 MG/ML IJ SOLN
40.0000 mg | Freq: Two times a day (BID) | INTRAMUSCULAR | Status: AC
  Administered 2022-03-21 – 2022-03-22 (×3): 40 mg via INTRAVENOUS
  Filled 2022-03-21 (×3): qty 4

## 2022-03-21 NOTE — Progress Notes (Addendum)
[place] NOTE       Patient ID: TAMBRA MULLER MRN: [id number] DOB/AGE: [birth year] 76 y.o.  Admit date: 03/19/2022 Referring Physician Dr. Eugenie Norrie Primary Physician Dr. Nobie Putnam Primary Cardiologist none Reason for Consultation CHF  HPI: Mackenzie Ward is a 76yoF with a PMH of COPD, ongoing tobacco use, depression, hyperlipidemia, chronic back pain who presented to [facility] ED 03/19/22 with leg swelling and SOB. Cardiology is consulted for assistance with possible new onset heart failure.   Interval History: - net neg 2.2L yesterday, remains on supplemental oxygen - feels well, walked with PT to the nurses station and felt good - no chest pain   Review of systems complete and found to be negative unless listed above    Past Medical History:  Diagnosis Date   Asthma    COPD (chronic obstructive pulmonary disease) ([place])    Depression    High cholesterol     Past Surgical History:  Procedure Laterality Date   BREAST SURGERY Right 2013   CATARACT EXTRACTION W/PHACO Left 08/26/2019   Procedure: CATARACT EXTRACTION PHACO AND INTRAOCULAR LENS PLACEMENT (IOC) LEFT  4.45    00:40.5    11.0%;  Surgeon: Leandrew Koyanagi, MD;  Location: [place];  Service: Ophthalmology;  Laterality: Left;   CATARACT EXTRACTION W/PHACO Right 09/30/2019   Procedure: CATARACT EXTRACTION PHACO AND INTRAOCULAR LENS PLACEMENT (IOC) RIGHT 4.05  00:44.4  9.1% ;  Surgeon: Leandrew Koyanagi, MD;  Location: [place];  Service: Ophthalmology;  Laterality: Right;   COLONOSCOPY  15 years ago   COLONOSCOPY WITH PROPOFOL N/A 11/10/2018   Procedure: COLONOSCOPY WITH PROPOFOL;  Surgeon: Lin Landsman, MD;  Location: [facility] ENDOSCOPY;  Service: Gastroenterology;  Laterality: N/A;   EYE SURGERY     TONSILLECTOMY      Medications Prior to Admission  Medication Sig Dispense Refill Last Dose   acetaminophen (TYLENOL) 500 MG tablet Take 1,000 mg by  mouth every 8 (eight) hours as needed.   prn at prn   albuterol (PROVENTIL) (2.5 MG/3ML) 0.083% nebulizer solution Take 3 mLs (2.5 mg total) by nebulization every 6 (six) hours as needed for wheezing or shortness of breath. 60 mL 1 prn at prn   ARIPiprazole (ABILIFY) 5 MG tablet Take 1 tablet (5 mg total) by mouth daily. 30 tablet 1 03/17/2022 at 2230   gabapentin (NEURONTIN) 300 MG capsule TAKE 1 CAPSULE BY MOUTH TWICE DAILY (Patient taking differently: Take 300 mg by mouth 3 (three) times daily.) 180 capsule 1 03/19/2022 at 0900   hydrOXYzine (ATARAX/VISTARIL) 10 MG tablet Take 1 tablet (10 mg total) by mouth 3 (three) times daily as needed for anxiety. 270 tablet 3 prn at prn   Magnesium Hydroxide (MAGNESIA PO) Take 3 capsules by mouth daily as needed.   prn at prn   rosuvastatin (CRESTOR) 5 MG tablet Take 1 tablet (5 mg total) by mouth at bedtime. 90 tablet 3 03/18/2022 at 2230   SPIRIVA HANDIHALER 18 MCG inhalation capsule Place 1 capsule (18 mcg total) into inhaler and inhale daily. (Patient taking differently: Place 18 mcg into inhaler and inhale daily as needed. Place 1 capsule (18 mcg total) into inhaler and inhale daily.) 90 capsule 1 prn at prn   venlafaxine XR (EFFEXOR-XR) 150 MG 24 hr capsule Take total of 225 mg daily, along with 75 mg cap 90 capsule 3 03/19/2022 at 0930   venlafaxine XR (EFFEXOR-XR) 75 MG 24 hr capsule Take total of 225 mg daily, along  with 75 mg cap 90 capsule 3 03/19/2022 at 0930    Social History   Socioeconomic History   Marital status: Widowed    Spouse name: Not on file   Number of children: Not on file   Years of education: Not on file   Highest education level: Some college, no degree  Occupational History   Occupation: retired  Tobacco Use   Smoking status: Every Day    Packs/day: 1.00    Years: 45.00    Total pack years: 45.00    Types: Cigarettes   Smokeless tobacco: Never  Vaping Use   Vaping Use: Never used  Substance and Sexual Activity   Alcohol  use: No    Alcohol/week: 0.0 standard drinks of alcohol   Drug use: No   Sexual activity: Not Currently  Other Topics Concern   Not on file  Social History Narrative   Goes to dinner with friends often, goes to see her mom at nursing home on a regular basis .         Pt plans on taking a trip to [place] 05/2022.   Social Determinants of Health   Financial Resource Strain: Low Risk  (11/16/2021)   Overall Financial Resource Strain (CARDIA)    Difficulty of Paying Living Expenses: Not hard at all  Food Insecurity: No Food Insecurity (11/16/2021)   Hunger Vital Sign    Worried About Running Out of Food in the Last Year: Never true    Ran Out of Food in the Last Year: Never true  Transportation Needs: No Transportation Needs (11/16/2021)   PRAPARE - Hydrologist (Medical): No    Lack of Transportation (Non-Medical): No  Physical Activity: Inactive (11/16/2021)   Exercise Vital Sign    Days of Exercise per Week: 0 days    Minutes of Exercise per Session: 0 min  Stress: Stress Concern Present (11/16/2021)   [place]    Feeling of Stress : To some extent  Social Connections: Socially Isolated (11/16/2021)   Social Connection and Isolation Panel [NHANES]    Frequency of Communication with Friends and Family: Twice a week    Frequency of Social Gatherings with Friends and Family: Never    Attends Religious Services: Never    Marine scientist or Organizations: No    Attends Archivist Meetings: Never    Marital Status: Widowed  Intimate Partner Violence: Not At Risk (11/16/2021)   Humiliation, Afraid, Rape, and Kick questionnaire    Fear of Current or Ex-Partner: No    Emotionally Abused: No    Physically Abused: No    Sexually Abused: No    Family History  Problem Relation Age of Onset   Lymphoma Mother        Non Hodgkin   Dementia Mother    Alzheimer's disease Mother     Lymphoma Sister        Non Hodgkin   Depression Sister    Heart attack Father    Alcohol abuse Father    Dementia Brother    Dementia Maternal Uncle    Alzheimer's disease Maternal Aunt    Alzheimer's disease Paternal Aunt    Alzheimer's disease Paternal Grandmother    Alzheimer's disease Maternal Aunt    Alzheimer's disease Paternal Aunt       PHYSICAL EXAM General: Pleasant elderly Caucasian female, well nourished, in no acute distress.  Sitting upright in hospital bed  with her sister at bedside. HEENT:  Normocephalic and atraumatic. Neck:  No JVD.  Lungs: Normal respiratory effort on 2 L by [place].  Expiratory wheezes throughout with poor air movement and trace crackles  Heart: Tachy but regular. Normal S1 and S2 without gallops or murmurs. Radial & DP pulses 2+ bilaterally. Abdomen: Non-distended appearing.  Msk: Normal strength and tone for age. Extremities: Warm and well perfused. No clubbing, cyanosis.  Trace bilateral LE pedal edema.  Neuro: Alert and oriented X 3. Psych:  Answers questions appropriately.   Labs:   Lab Results  Component Value Date   WBC 8.2 03/20/2022   HGB 12.3 03/20/2022   HCT 40.9 03/20/2022   MCV 88.5 03/20/2022   PLT 414 (H) 03/20/2022    Recent Labs  Lab 03/19/22 1801 03/20/22 0500  NA 138 141  K 4.6 4.1  CL 100 102  CO2 27 30  BUN 9 8  CREATININE 0.73 0.64  CALCIUM 8.3* 8.1*  PROT 6.4*  --   BILITOT 0.5  --   ALKPHOS 140*  --   ALT 26  --   AST 28  --   GLUCOSE 81 86    No results found for: "CKTOTAL", "CKMB", "CKMBINDEX", "TROPONINI"  Lab Results  Component Value Date   CHOL 267 (H) 01/08/2022   CHOL 242 (H) 01/09/2021   CHOL 275 (H) 11/27/2018   Lab Results  Component Value Date   HDL 62 01/08/2022   HDL 50 01/09/2021   HDL 56 11/27/2018   Lab Results  Component Value Date   LDLCALC 173 (H) 01/08/2022   LDLCALC 159 (H) 01/09/2021   LDLCALC 178 (H) 11/27/2018   Lab Results  Component Value Date   TRIG 167 (H)  01/08/2022   TRIG 177 (H) 01/09/2021   TRIG 240 (H) 11/27/2018   Lab Results  Component Value Date   CHOLHDL 4.3 01/08/2022   CHOLHDL 4.8 01/09/2021   CHOLHDL 4.9 11/27/2018   No results found for: "LDLDIRECT"    Radiology: ECHOCARDIOGRAM COMPLETE  Result Date: 03/20/2022    ECHOCARDIOGRAM REPORT   Patient Name:   TANDI HANKO Date of Exam: 03/20/2022 Medical Rec #:  [id number]         Height:       58.0 in Accession #:    [id number]        Weight:       164.0 lb Date of Birth:  [birth year]         BSA:          1.674 m Patient Age:    49 years          BP:           103/68 mmHg Patient Gender: F                 HR:           102 bpm. Exam Location:  [facility] Procedure: 2D Echo, Cardiac Doppler and Color Doppler Indications:     CHF-acute diastolic H85.27  History:         Patient has no prior history of Echocardiogram examinations.                  COPD. High cholesterol.  Sonographer:     Sherrie Sport Referring Phys:  [id number] JAN A MANSY Diagnosing Phys: Harrell Gave End MD  Sonographer Comments: Technically challenging study due to limited acoustic windows and no apical window. IMPRESSIONS  1. Left ventricular ejection  fraction, by estimation, is 55 to 60%. The left ventricle has normal function. The left ventricle demonstrates regional wall motion abnormalities (see scoring diagram/findings for description). Left ventricular diastolic function could not be evaluated. There is the interventricular septum is flattened in diastole ('D' shaped left ventricle), consistent with right ventricular volume overload. There is akinesis of the left ventricular, basal inferior segment.  2. Right ventricular systolic function was not well visualized. The right ventricular size is not well visualized.  3. The mitral valve is abnormal. No evidence of mitral valve regurgitation. Moderate mitral annular calcification.  4. The aortic valve was not well visualized. FINDINGS  Left Ventricle: Left ventricular ejection  fraction, by estimation, is 55 to 60%. The left ventricle has normal function. The left ventricle demonstrates regional wall motion abnormalities. The left ventricular internal cavity size was normal in size. There is borderline left ventricular hypertrophy. The interventricular septum is flattened in diastole ('D' shaped left ventricle), consistent with right ventricular volume overload. Left ventricular diastolic function could not be evaluated. Right Ventricle: The right ventricular size is not well visualized. No increase in right ventricular wall thickness. Right ventricular systolic function was not well visualized. Left Atrium: Left atrial size was not well visualized. Right Atrium: Right atrial size was not well visualized. Pericardium: The pericardium was not well visualized. Mitral Valve: The mitral valve is abnormal. Moderate mitral annular calcification. No evidence of mitral valve regurgitation. Tricuspid Valve: The tricuspid valve is not well visualized. Tricuspid valve regurgitation is trivial. Aortic Valve: Poor apical windows limit assessment for aortic regurgitation and stenosis. The aortic valve was not well visualized. Pulmonic Valve: The pulmonic valve was not well visualized. Pulmonic valve regurgitation is not visualized. No evidence of pulmonic stenosis. Aorta: The aortic root is normal in size and structure. Pulmonary Artery: The pulmonary artery is not well seen. Venous: The inferior vena cava was not well visualized. IAS/Shunts: The interatrial septum was not well visualized.  LEFT VENTRICLE PLAX 2D LVIDd:         4.60 cm LVIDs:         3.30 cm LV PW:         1.00 cm LV IVS:        0.90 cm LVOT diam:     2.00 cm LVOT Area:     3.14 cm  LEFT ATRIUM         Index LA diam:    4.20 cm 2.51 cm/m   AORTA Ao Root diam: 3.10 cm TRICUSPID VALVE TR Peak grad:   16.6 mmHg TR Vmax:        204.00 cm/s  SHUNTS Systemic Diam: 2.00 cm Nelva Bush MD Electronically signed by Nelva Bush MD  Signature Date/Time: 03/20/2022/11:33:02 AM    Final    CT Angio Chest PE W and/or Wo Contrast  Result Date: 03/20/2022 CLINICAL DATA:  Pulmonary embolism (PE) suspected, high prob EXAM: CT ANGIOGRAPHY CHEST WITH CONTRAST TECHNIQUE: Multidetector CT imaging of the chest was performed using the standard protocol during bolus administration of intravenous contrast. Multiplanar CT image reconstructions and MIPs were obtained to evaluate the vascular anatomy. RADIATION DOSE REDUCTION: This exam was performed according to the departmental dose-optimization program which includes automated exposure control, adjustment of the mA and/or kV according to patient size and/or use of iterative reconstruction technique. CONTRAST:  62m OMNIPAQUE IOHEXOL 350 MG/ML SOLN COMPARISON:  01/02/2022 FINDINGS: Cardiovascular: Small filling defect seen within a posterior right lower lobe pulmonary arterial branch on image 79 of series 4  suggesting very small nonocclusive pulmonary embolus. No additional filling defects noted. Heart is normal size. Diffuse coronary artery calcifications. Moderate aortic calcifications. No aneurysm. Mediastinum/Nodes: No mediastinal, hilar, or axillary adenopathy. Trachea and esophagus are unremarkable. Thyroid unremarkable. Lungs/Pleura: Moderate right pleural effusion. Atelectasis in the right middle lobe and right lower lobe. Minimal left base atelectasis with no effusion on the left. Upper Abdomen: Imaging into the upper abdomen demonstrates no acute findings. Musculoskeletal: Chest wall soft tissues are unremarkable. No acute bony abnormality. Review of the MIP images confirms the above findings. IMPRESSION: Very small filling defect within a left lower lobe pulmonary arterial branch compatible with small nonocclusive pulmonary embolus. Moderate right pleural effusion. Right middle lobe and lower lobe atelectasis. Extensive coronary artery disease. Aortic Atherosclerosis (ICD10-I70.0). Electronically  Signed   By: Rolm Baptise M.D.   On: 03/20/2022 00:02   US Venous Img Lower Bilateral  Result Date: 03/19/2022 CLINICAL DATA:  Leg swelling EXAM: BILATERAL LOWER EXTREMITY VENOUS DOPPLER ULTRASOUND TECHNIQUE: Gray-scale sonography with compression, as well as color and duplex ultrasound, were performed to evaluate the deep venous system(s) from the level of the common femoral vein through the popliteal and proximal calf veins. COMPARISON:  None Available. FINDINGS: VENOUS Normal compressibility of the common femoral, superficial femoral, and popliteal veins, as well as the visualized calf veins. Visualized portions of profunda femoral vein and great saphenous vein unremarkable. No filling defects to suggest DVT on grayscale or color Doppler imaging. Doppler waveforms show normal direction of venous flow, normal respiratory plasticity and response to augmentation. OTHER None. Limitations: Visualization of the calf veins due to edema. IMPRESSION: No evidence of lower extremity DVT. Electronically Signed   By: Rolm Baptise M.D.   On: 03/19/2022 23:41   DG Chest 2 View  Result Date: 03/19/2022 CLINICAL DATA:  Increased swelling in legs, shortness of breath, oxygen desaturation EXAM: CHEST - 2 VIEW COMPARISON:  02/16/2016 FINDINGS: Enlargement of cardiac silhouette with pulmonary vascular congestion. Atherosclerotic calcification aorta. RIGHT pleural effusion and basilar atelectasis. Mild interstitial prominence versus previous exam question minimal pulmonary edema. At emphysematous changes and chronic central peribronchial thickening. No pneumothorax or acute osseous findings. IMPRESSION: Enlargement of cardiac silhouette with pulmonary vascular congestion and question minimal pulmonary edema. COPD changes with RIGHT pleural effusion and basilar atelectasis. Aortic Atherosclerosis (ICD10-I70.0). Emphysema (ICD10-J43.9). Electronically Signed   By: Lavonia Dana M.D.   On: 03/19/2022 18:28    ECHO LVEF 55-60%,  interventricular septum flattened in diastole c/w RV overload, akinesis of left ventricular, basilar inferior segment  TELEMETRY reviewed by me: Sinus tachycardia with rates in the low 100s to 110  EKG reviewed by me: sinus tach rate 108 T wave inversions in lead III and aVF and flattening anterolaterally  ASSESSMENT AND PLAN:  Mackenzie Ward is a 19yoF with a PMH of COPD, ongoing tobacco use, depression, hyperlipidemia, chronic back pain who presented to [facility] ED 03/19/22 with leg swelling and SOB. Cardiology is consulted for assistance with possible new onset heart failure.   #Acute hypoxic respiratory failure #Newly diagnosed HFpEF (LVEF 55-60%, LV basal inferior wall akinesis) #Hyperlipidemia The patient presents with 1 week of lower extremity swelling up to her knees, orthopnea, and shortness of breath on top of her chronic dyspnea on exertion.  Her BNP is elevated to 800 with a right pleural effusion and vascular congestion on chest x-ray, and clinical volume overload and her lower legs with a new oxygen requirement. incidentally, she has a inferior basilar wall motion abnormality, she has  risk factors for CAD including ongoing tobacco use and hyperlipidemia, with an extensive family history of CAD.  She has never had an ischemic work-up in the outpatient setting.  Also incidentally, a very small left lower lobe filling defect was noted on CTA chest and extensive coronary artery calcifications. -S/p IV Lasix 40 mg x 4 doses with good UOP -Continue Lasix 40 mg twice daily for now -continue aspirin 81 mg daily -Increase rosuvastatin from '5mg'$  to 20 mg daily -Initiate beta blocker for GDMT pending clinical course and as BP tolerates, low 56P systolic to 014D at best so dar today -wean oxygen as tolerated, defer management of small PE to primary team -- planning observation  -strict I/O, daily weights -discussed low sodium, heart healthy diet -will need outpatient cardiology follow up at  discharge for consideration of ischemic evaluation  #COPD #tobacco abuse -Discussed smoking cessation at length with patient, she has tried to quit multiple times in the past (including hypnosis) currently smokes 1 pack/day, possibly eager to cut back but not interested in cessation at this time  This patient's plan of care was discussed and created with Dr. Nehemiah Massed and he is in agreement.  Signed: Tristan Schroeder , PA-C 03/21/2022, 9:38 AM [facility]  Overall patient has had a significant improvements in overall symptoms and issues including edema pulmonary edema shortness of breath.  There is been no evidence of acute coronary syndrome and will be able to likely discharged home today if ambulating well with follow-up in 1 to 2 weeks  The patient has been interviewed and examined. I agree with assessment and plan above. Serafina Royals MD [facility]

## 2022-03-21 NOTE — Progress Notes (Signed)
  Progress Note   Patient: Mackenzie Ward MKL:[id number] DOB: Jul 18, 1946 DOA: 03/19/2022     2 DOS: the patient was seen and examined on 03/21/2022   Brief hospital course: MADOLINE BHATT is a 76 y.o. female with medical history significant for asthma and COPD as well as depression and dyslipidemia, presented to the emergency room with a  worsening dyspnea with associated orthopnea since last Wednesday as well as dry cough and wheezing.  He continues to smoke cigarettes.  Upon arriving the hospital, her oxygen saturation was 89% on room air, he was placed on 2 L oxygen.  Chest x-ray showed cardiomegaly with pulmonary vascular congestion, COPD changes.  BNP elevated 85 5.  Patient is diagnosed with acute on chronic congestive heart failure.  She is giving IV Lasix. Echocardiogram performed on 7/11 showed ejection fraction 69-79%, diastolic function cannot be evaluated, but the patient has evidence of right-sided volume overload.  Assessment and Plan: Acute respiratory failure with hypoxemia. Acute on chronic diastolic congestive heart failure. Elevated troponin secondary to congestive heart failure. Patient is diuresing well, condition improving.  But still on 2 L oxygen, will wean off.  Patient will benefit for another day of IV Lasix. She also had a sinus tachycardia, I reviewed the telemetry and EKG, no atrial fibrillation.  She was able to ambulate with the physical therapy without significant drop of oxygen saturation, her heart rate only went up to 130.    Subsegmental PE. Reactive thrombocytosis. No need for anticoagulation at this time.   COPD. Tobacco abuse Stable, no bronchospasm. Advised to quit smoking.        Subjective:  Patient doing better today, short of breath improved.  But still on 2 L oxygen.  No cough  Physical Exam: Vitals:   03/20/22 2314 03/21/22 0433 03/21/22 1000 03/21/22 1145  BP: (!) 115/57 103/61 (!) 94/52 (!) 109/59  Pulse: (!) 106 (!) 109 (!) 109  (!) 106  Resp: '19 18 17 17  '$ Temp: 98.6 F (37 C) 98.2 F (36.8 C) 97.9 F (36.6 C) 98.4 F (36.9 C)  TempSrc: Oral  Oral Oral  SpO2: 92% (!) 89% 94% 94%  Weight:  71.9 kg    Height:       General exam: Appears calm and comfortable  Respiratory system: Clear to auscultation. Respiratory effort normal. Cardiovascular system: A few crackles in the base no JVD, murmurs, rubs, gallops or clicks.  Gastrointestinal system: Abdomen is nondistended, soft and nontender. No organomegaly or masses felt. Normal bowel sounds heard. Central nervous system: Alert and oriented. No focal neurological deficits. Extremities: Leg edema much improved. Skin: No rashes, lesions or ulcers Psychiatry: Judgement and insight appear normal. Mood & affect appropriate.   Data Reviewed:  Lab results reviewed.  Family Communication:   Disposition: Status is: Inpatient Remains inpatient appropriate because: Severity of disease, IV treatment.  Planned Discharge Destination: Home with Home Health    Time spent: 35 minutes  Author: Sharen Hones, MD 03/21/2022 12:46 PM  For on call review [email or web address].

## 2022-03-21 NOTE — Consult Note (Addendum)
   Heart Failure Nurse Navigator Note  HFpEF 50 to 55%.  Unable to evaluate diastolic dysfunction.  D-shaped left ventricle noted.  She presented to the emergency room with complaints of increasing shortness of breath and lower extremity edema, since July 5.  BNP was 855.  Chest x-ray revealed pulmonary vascular congestion.  CT revealed small nonocclusive PE.  Moderate right pleural effusion.  Comorbidities:  Asthma COPD Depression Hyperlipidemia Continued tobacco abuse  Medications:  Aspirin 81 mg daily Furosemide 40 mg IV 2 times a day NicoDerm patch 21 mg daily Crestor 20 mg at bedtime  Labs:  Sodium 141, potassium 4.1, CO2 30, chloride 102, BUN 8, creatinine 0.64, GFR greater than 60. Weight is 71.9 kg Blood pressure 109/59 Intake not documented Output 2200 mL    Initial meeting with patient who was sitting up in the chair at bedside in no acute distress, also present was her sister, Shirlean Mylar whom she lives with.  Discussed heart failure.  He states that she is familiar with the term but not in relationship to herself.  Discussed the importance of daily weights, recording and what to report.  Also discussed limiting fluid restriction to less than 64 ounces daily also went over what is included in a liquid.  She states that she has a scale but does not weigh daily.  Discussed making wise choices with diet, reading labels and abstaining from use of salt when cooking also not adding it at the table.  Sister states that she was not aware that she should not be cooking with it but that they had not been adding it at the table  Also talked about restaurant eating, they do like to get a pizza, gave an example of if she had 1-2 slices of pizza for a meal to be more cognizant of on her other 2 meals eating foods that were lower in sodium content.  They voiced understand.  The Patient and her sister  are taking a cruise in September, going to [place] and [place] etc. reminded that the  foods will probably be higher in sodium content and to make good choices. "Lots of fruits and vegetables"  Also discussed the ventricle health program for which she qualifies as being a [facility] patient.  Patient was given flyer and explained the program.  She states that she is ready to enroll, application sent.    We will continue to follow along.  Pricilla Riffle RN [facility]

## 2022-03-21 NOTE — Progress Notes (Signed)
Physical Therapy Evaluation Patient Details Name: Mackenzie Ward MRN: [id number] DOB: 1945-09-26 Today's Date: 03/21/2022  History of Present Illness  Pt is a 76 y.o. female with PMH including asthma, COPD, depression, dyslipidemia, tobacco use, and chronic back pain. Pt presented to ED on 03/19/22 with chief concern of swelling in LEs and SOB. Pt admitted for new onset acute on chronic CHF.   Clinical Impression  Pt is a pleasant 76 year old female who was admitted for new onset CHF. Cleared to see pt by MD [place] in hallway prior to session due to small PE upon chart review. Pt lives in an apartment with her sister who is available 24/7 for assistance if needed. PLOF includes ambulatory with SPC in community, without AD in home, driving, independent with ADLs. Pt performs bed mobility mod I, transfers and ambulation with CGA and personal SPC. Pt ambulated 200 feet around RN station. Pt maintained on 2 lpm O2 via nasal cannula throughout duration of session. SpO2/HR monitored throughout session: at rest 91% HR 110 bpm, sitting 90% HR 117 bpm, post-ambulation 89% HR 123 bpm, at rest in chair at end of session 93% after education on pursed lip breathingg HR 116 bpm (MD notified of SpO2/HR status post-session). Pt demonstrates deficits with balance, endurance, and strength. Would benefit from skilled PT to address above deficits and promote optimal return to PLOF. Recommend HH PT at discharge due to pt current mobility status and assistance from sister at home.    Recommendations for follow up therapy are one component of a multi-disciplinary discharge planning process, led by the attending physician.  Recommendations may be updated based on patient status, additional functional criteria and insurance authorization.  Follow Up Recommendations Outpatient PT      Assistance Recommended at Discharge PRN  Patient can return home with the following  Help with stairs or ramp for entrance;A little help  with walking and/or transfers    Equipment Recommendations None recommended by PT  Recommendations for Other Services       Functional Status Assessment Patient has had a recent decline in their functional status and demonstrates the ability to make significant improvements in function in a reasonable and predictable amount of time.     Precautions / Restrictions Precautions Precautions: Fall Restrictions Weight Bearing Restrictions: No      Mobility  Bed Mobility Overal bed mobility: Modified Independent                  Transfers Overall transfer level: Needs assistance Equipment used: Straight cane Transfers: Sit to/from Stand Sit to Stand: Min guard           General transfer comment: Sit to stand from EOB. Slightly unsteady upon initial stand but pt able to self-correct without physical assist. Pt stated it was her first time standing up since she's been at the hospital.    Ambulation/Gait Ambulation/Gait assistance: Min guard Gait Distance (Feet): 200 Feet Assistive device: Straight cane Gait Pattern/deviations: Step-through pattern Gait velocity: decreased     General Gait Details: Step-through continuous gait pattern with slightly decreased speed. Pt reported no dizziness, SOB with ambulation.  Stairs            Wheelchair Mobility    Modified Rankin (Stroke Patients Only)       Balance Overall balance assessment: Needs assistance Sitting-balance support: No upper extremity supported, Feet unsupported Sitting balance-Leahy Scale: Good Sitting balance - Comments: Pt able to sit EOB with feet unsupported, no UE or back  support, no LOB. Pt sat EOB for MMT with bilat UE support on bed   Standing balance support: Single extremity supported Standing balance-Leahy Scale: Good Standing balance comment: Pt ambulated steadily with [facility]                             Pertinent Vitals/Pain Pain Assessment Pain Assessment: Faces Faces  Pain Scale: Hurts a little bit Pain Location: back down R LE (self-reported as Sciatica) Pain Descriptors / Indicators: Shooting Pain Intervention(s): Limited activity within patient's tolerance, Monitored during session, Repositioned    Home Living Family/patient expects to be discharged to:: Private residence Living Arrangements: Other relatives (Sister) Available Help at Discharge: Available 24 hours/day;Family Type of Home: Apartment Home Access: Stairs to enter Entrance Stairs-Rails: Right Entrance Stairs-Number of Steps: 7 (3 steps both rails, 4 steps right rail along sidewalk to walk up to 1st floor apt (on a hill))   Home Layout: One level Home Equipment: [place] - single point      Prior Function Prior Level of Function : Independent/Modified Independent;Driving;History of Falls (last six months)             Mobility Comments: Ambulatory without AD in home (reported occasional use of furniture for support), with [place] in community. Retired. Driving. Reported 1 fall in the past 6 months where she "tripped over her own feet" but was able to get back up on her own with no injuries incurred. ADLs Comments: Independent with ADLs     Hand Dominance   Dominant Hand: Right    Extremity/Trunk Assessment   Upper Extremity Assessment Upper Extremity Assessment: Overall WFL for tasks assessed    Lower Extremity Assessment Lower Extremity Assessment: Overall WFL for tasks assessed (Gross bilat LE MMT 4/5 (hip flex, knee ext, knee flex, ankle DF in sitting))    Cervical / Trunk Assessment Cervical / Trunk Assessment: Normal  Communication   Communication: No difficulties  Cognition Arousal/Alertness: Awake/alert Behavior During Therapy: WFL for tasks assessed/performed Overall Cognitive Status: Within Functional Limits for tasks assessed                                 General Comments: A&Ox4        General Comments General comments (skin integrity, edema,  etc.): SpO2/HR monitored throughout session: at rest 91% HR 110 bpm, sitting 90% HR 117 bpm, post-ambulation 89% HR 123 bpm, at rest in chair at end of session 93% after education on pursed lip breathingg HR 116 bpm    Exercises     Assessment/Plan    PT Assessment Patient needs continued PT services  PT Problem List Decreased strength;Decreased activity tolerance;Decreased balance;Cardiopulmonary status limiting activity;Pain       PT Treatment Interventions Gait training;Stair training;Functional mobility training;Therapeutic activities;Therapeutic exercise;Balance training;Patient/family education;DME instruction    PT Goals (Current goals can be found in the Care Plan section)  Acute Rehab PT Goals Patient Stated Goal: to go home PT Goal Formulation: With patient Time For Goal Achievement: 04/04/22 Potential to Achieve Goals: Good    Frequency Min 2X/week     Co-evaluation               AM-PAC PT "6 Clicks" Mobility  Outcome Measure Help needed turning from your back to your side while in a flat bed without using bedrails?: None Help needed moving from lying on your back to sitting on  the side of a flat bed without using bedrails?: None Help needed moving to and from a bed to a chair (including a wheelchair)?: A Little Help needed standing up from a chair using your arms (e.g., wheelchair or bedside chair)?: A Little Help needed to walk in hospital room?: A Little Help needed climbing 3-5 steps with a railing? : A Little 6 Click Score: 20    End of Session Equipment Utilized During Treatment: Gait belt;Oxygen Activity Tolerance: Patient tolerated treatment well Patient left: in chair;with call bell/phone within reach;with chair alarm set;Other (comment) (will pillow placed under bilat LEs to elevate heels) Nurse Communication: Mobility status PT Visit Diagnosis: History of falling (Z91.81);Other abnormalities of gait and mobility (R26.89);Pain Pain - Right/Left:  Right Pain - part of body: Leg (Sciatica)    Time: 0300-9233 PT Time Calculation (min) (ACUTE ONLY): 29 min   Charges:             Mackenzie Ward, SPT  Mackenzie Ward 03/21/2022, 1:33 PM

## 2022-03-22 DIAGNOSIS — I2693 Single subsegmental pulmonary embolism without acute cor pulmonale: Secondary | ICD-10-CM | POA: Diagnosis not present

## 2022-03-22 DIAGNOSIS — J9601 Acute respiratory failure with hypoxia: Secondary | ICD-10-CM | POA: Diagnosis not present

## 2022-03-22 DIAGNOSIS — I5033 Acute on chronic diastolic (congestive) heart failure: Secondary | ICD-10-CM | POA: Diagnosis not present

## 2022-03-22 LAB — BASIC METABOLIC PANEL
Anion gap: 5 (ref 5–15)
BUN: 10 mg/dL (ref 8–23)
CO2: 37 mmol/L — ABNORMAL HIGH (ref 22–32)
Calcium: 8.2 mg/dL — ABNORMAL LOW (ref 8.9–10.3)
Chloride: 96 mmol/L — ABNORMAL LOW (ref 98–111)
Creatinine, Ser: 0.76 mg/dL (ref 0.44–1.00)
GFR, Estimated: >60 mL/min (ref >60)
Glucose, Bld: 90 mg/dL (ref 70–99)
Potassium: 3.2 mmol/L — ABNORMAL LOW (ref 3.5–5.1)
Sodium: 138 mmol/L (ref 135–145)

## 2022-03-22 LAB — MAGNESIUM: Magnesium: 2 mg/dL (ref 1.7–2.4)

## 2022-03-22 MED ORDER — POTASSIUM CHLORIDE 20 MEQ PO PACK
40.0000 meq | PACK | Freq: Once | ORAL | Status: AC
  Administered 2022-03-22: 40 meq via ORAL
  Filled 2022-03-22: qty 2

## 2022-03-22 MED ORDER — POTASSIUM CHLORIDE 10 MEQ/100ML IV SOLN
10.0000 meq | Freq: Once | INTRAVENOUS | Status: DC
  Administered 2022-03-22: 10 meq via INTRAVENOUS
  Filled 2022-03-22: qty 100

## 2022-03-22 MED ORDER — SODIUM CHLORIDE 0.9% FLUSH
3.0000 mL | Freq: Two times a day (BID) | INTRAVENOUS | Status: DC
  Administered 2022-03-22 – 2022-03-23 (×2): 3 mL via INTRAVENOUS

## 2022-03-22 MED ORDER — POTASSIUM CHLORIDE CRYS ER 20 MEQ PO TBCR
40.0000 meq | EXTENDED_RELEASE_TABLET | ORAL | Status: AC
  Administered 2022-03-22 (×2): 40 meq via ORAL
  Filled 2022-03-22 (×2): qty 2

## 2022-03-22 MED ORDER — METOPROLOL SUCCINATE ER 25 MG PO TB24
12.5000 mg | ORAL_TABLET | Freq: Every day | ORAL | Status: DC
  Administered 2022-03-22 – 2022-03-23 (×2): 12.5 mg via ORAL
  Filled 2022-03-22 (×2): qty 1

## 2022-03-22 MED ORDER — FUROSEMIDE 20 MG PO TABS: 20.0000 mg | ORAL_TABLET | Freq: Every day | ORAL | Status: DC

## 2022-03-22 MED ORDER — FUROSEMIDE 10 MG/ML IJ SOLN
40.0000 mg | Freq: Two times a day (BID) | INTRAMUSCULAR | Status: AC
  Administered 2022-03-22 – 2022-03-23 (×2): 40 mg via INTRAVENOUS
  Filled 2022-03-22 (×2): qty 4

## 2022-03-22 NOTE — Progress Notes (Addendum)
SATURATION QUALIFICATIONS: (This note is used to comply with regulatory documentation for home oxygen)  Patient Saturations on Room Air at Rest = 87%  Patient Saturation on 1 Liters at rest = 90%  Patient Saturations on 1 Liters of oxygen while Ambulating = 92%  Please briefly explain why patient needs home oxygen:

## 2022-03-22 NOTE — Progress Notes (Signed)
Physical Therapy Treatment Patient Details Name: Mackenzie Ward MRN: [id number] DOB: 04/08/1946 Today's Date: 03/22/2022   History of Present Illness Pt is a 76 y.o. female with PMH including asthma, COPD, depression, dyslipidemia, tobacco use, and chronic back pain. Pt presented to ED on 03/19/22 with chief concern of swelling in LEs and SOB. Pt admitted for new onset acute on chronic CHF.   PT Comments    Pt agreeable to PT this date. Oxygen qualification completed this date. Pt taken off oxygen lying supine and decreased from 93% to 88% on room air. Pt was placed back on 1 lpm O2 via nasal cannula. Pt sat EOB with Mod I. Pt was taken off oxygen again and decreased to 88% again before being placed on portable tank on 1 lpm O2 for ambulation. Pt ambulated 200 feet with SPC and CGA on 1 lpm O2 via nasal cannula with SpO2 monitored after 50 feet at 92%. Pt reported no dizziness nor SOB during session. Upon return to room, pt SpO2 at 94-95% seated on 1 lpm O2. Noted pts elevated HR at 101 at rest and 107 post-ambulation. Pt left in chair with nursing present. Nursing notified of pt oxygen levels during session. Continue to recommend Outpatient PT at discharge.  SaO2 on room air at rest = 88% SaO2 on room air while ambulating = N/A SaO2 on 1 liter of O2 while ambulating = 92%   Recommendations for follow up therapy are one component of a multi-disciplinary discharge planning process, led by the attending physician.  Recommendations may be updated based on patient status, additional functional criteria and insurance authorization.  Follow Up Recommendations  Outpatient PT     Assistance Recommended at Discharge PRN  Patient can return home with the following Help with stairs or ramp for entrance;A little help with walking and/or transfers   Equipment Recommendations  None recommended by PT    Recommendations for Other Services       Precautions / Restrictions Precautions Precautions:  Fall Restrictions Weight Bearing Restrictions: No     Mobility  Bed Mobility Overal bed mobility: Modified Independent                  Transfers Overall transfer level: Needs assistance Equipment used: Straight cane Transfers: Sit to/from Stand Sit to Stand: Supervision           General transfer comment: Pt performed 2 sit-to-stands during session with supervision for safety and use of SPC    Ambulation/Gait Ambulation/Gait assistance: Min guard Gait Distance (Feet): 200 Feet Assistive device: Straight cane Gait Pattern/deviations: Step-through pattern Gait velocity: decreased     General Gait Details: Step-through continuous gait pattern with slightly decreased speed. Pt reported no dizziness nor SOB with ambulation.SpO2 monitored after 50 feet was 92% on 1 lpm O2.   Stairs             Wheelchair Mobility    Modified Rankin (Stroke Patients Only)       Balance Overall balance assessment: Needs assistance Sitting-balance support: No upper extremity supported, Feet unsupported Sitting balance-Leahy Scale: Good Sitting balance - Comments: Pt able to sit EOB with feet unsupported, no UE or back support, no LOB.   Standing balance support: Single extremity supported Standing balance-Leahy Scale: Good Standing balance comment: Pt ambulated steadily with [facility]                            Cognition Arousal/Alertness: Awake/alert Behavior  During Therapy: WFL for tasks assessed/performed Overall Cognitive Status: Within Functional Limits for tasks assessed                                          Exercises      General Comments        Pertinent Vitals/Pain Pain Assessment Pain Assessment: No/denies pain    Home Living                          Prior Function            PT Goals (current goals can now be found in the care plan section) Acute Rehab PT Goals Patient Stated Goal: to go home PT Goal  Formulation: With patient Time For Goal Achievement: 04/04/22 Potential to Achieve Goals: Good Progress towards PT goals: Progressing toward goals    Frequency    Min 2X/week      PT Plan Current plan remains appropriate    Co-evaluation              AM-PAC PT "6 Clicks" Mobility   Outcome Measure  Help needed turning from your back to your side while in a flat bed without using bedrails?: None Help needed moving from lying on your back to sitting on the side of a flat bed without using bedrails?: None Help needed moving to and from a bed to a chair (including a wheelchair)?: A Little Help needed standing up from a chair using your arms (e.g., wheelchair or bedside chair)?: A Little Help needed to walk in hospital room?: A Little Help needed climbing 3-5 steps with a railing? : A Little 6 Click Score: 20    End of Session Equipment Utilized During Treatment: Gait belt;Oxygen Activity Tolerance: Patient tolerated treatment well Patient left: in chair;with call bell/phone within reach;with chair alarm set;with nursing/sitter in room Nurse Communication: Mobility status PT Visit Diagnosis: History of falling (Z91.81);Other abnormalities of gait and mobility (R26.89);Pain Pain - Right/Left: Right Pain - part of body: Leg (Sciatica)     Time: 3016-0109 PT Time Calculation (min) (ACUTE ONLY): 24 min  Charges:                        Rella Larve, SPT  Chrishonda Hesch 03/22/2022, 4:42 PM

## 2022-03-22 NOTE — Progress Notes (Signed)
  Progress Note   Patient: Mackenzie Ward RKY:[id number] DOB: Aug 04, 1946 DOA: 03/19/2022     3 DOS: the patient was seen and examined on 03/22/2022   Brief hospital course: BANEEN WIESELER is a 76 y.o. female with medical history significant for asthma and COPD as well as depression and dyslipidemia, presented to the emergency room with a  worsening dyspnea with associated orthopnea since last Wednesday as well as dry cough and wheezing.  He continues to smoke cigarettes.  Upon arriving the hospital, her oxygen saturation was 89% on room air, he was placed on 2 L oxygen.  Chest x-ray showed cardiomegaly with pulmonary vascular congestion, COPD changes.  BNP elevated 85 5.  Patient is diagnosed with acute on chronic congestive heart failure.  She is giving IV Lasix. Echocardiogram performed on 7/11 showed ejection fraction 31-51%, diastolic function cannot be evaluated, but the patient has evidence of right-sided volume overload.  Assessment and Plan: Acute respiratory failure with hypoxemia. Acute on chronic diastolic congestive heart failure. Elevated troponin secondary to congestive heart failure. Condition gradually improving, patient still has some volume overload, continue IV Lasix. Increase beta-blocker dose for sinus tachycardia.  Hypokalemia Replaced potassium, recheck a BMP and magnesium in the morning.   Subsegmental PE. Reactive thrombocytosis. No need for anticoagulation at this time.   COPD. Tobacco abuse Stable, no bronchospasm. Advised to quit smoking.      Subjective:  Patient feels much improved, less short of breath.  Had a good night sleep.  Physical Exam: Vitals:   03/22/22 0753 03/22/22 1138 03/22/22 1200 03/22/22 1403  BP: 106/64 108/62  (!) 100/57  Pulse: (!) 102 (!) 101  (!) 104  Resp: 18 20    Temp: 98 F (36.7 C) 98.6 F (37 C)    TempSrc:      SpO2: 100% 97% 91% (!) 89%  Weight:      Height:       General exam: Appears calm and comfortable   Respiratory system: Crackles in the base. Respiratory effort normal. Cardiovascular system: S1 & S2 heard, RRR. No JVD, murmurs, rubs, gallops or clicks. No pedal edema. Gastrointestinal system: Abdomen is nondistended, soft and nontender. No organomegaly or masses felt. Normal bowel sounds heard. Central nervous system: Alert and oriented. No focal neurological deficits. Extremities: Symmetric 5 x 5 power. Skin: No rashes, lesions or ulcers Psychiatry: Judgement and insight appear normal. Mood & affect appropriate.   Data Reviewed:  Lab results reviewed  Family Communication:   Disposition: Status is: Inpatient Remains inpatient appropriate because: Severity of disease, IV treatment  Planned Discharge Destination: Home with Home Health    Time spent: 35 minutes  Author: Sharen Hones, MD 03/22/2022 2:22 PM  For on call review [email or web address].

## 2022-03-22 NOTE — Progress Notes (Signed)
Mackenzie Ward       Patient ID: MARILYNE HASELEY MRN: [id number] DOB/AGE: 75-23-1947 75 y.o.  Admit date: 03/19/2022 Referring Physician Dr. Eugenie Norrie Primary Physician Dr. Nobie Putnam Primary Cardiologist none Reason for Consultation CHF  HPI: Mackenzie Ward is a 12yoF with a PMH of COPD, ongoing tobacco use, depression, hyperlipidemia, chronic back pain who presented to [facility] ED 03/19/22 with leg swelling and SOB. Cardiology is consulted for assistance with possible new onset heart failure.   Interval History: - diuresing well but remains on supplemental O2 with crackles in lungs - eager to go home, feels like her breathing and swelling is getting better   Review of systems complete and found to be negative unless listed above    Past Medical History:  Diagnosis Date   Asthma    COPD (chronic obstructive pulmonary disease) ([place])    Depression    High cholesterol     Past Surgical History:  Procedure Laterality Date   BREAST SURGERY Right 2013   CATARACT EXTRACTION W/PHACO Left 08/26/2019   Procedure: CATARACT EXTRACTION PHACO AND INTRAOCULAR LENS PLACEMENT (IOC) LEFT  4.45    00:40.5    11.0%;  Surgeon: Leandrew Koyanagi, MD;  Location: [place];  Service: Ophthalmology;  Laterality: Left;   CATARACT EXTRACTION W/PHACO Right 09/30/2019   Procedure: CATARACT EXTRACTION PHACO AND INTRAOCULAR LENS PLACEMENT (IOC) RIGHT 4.05  00:44.4  9.1% ;  Surgeon: Leandrew Koyanagi, MD;  Location: [place];  Service: Ophthalmology;  Laterality: Right;   COLONOSCOPY  15 years ago   COLONOSCOPY WITH PROPOFOL N/A 11/10/2018   Procedure: COLONOSCOPY WITH PROPOFOL;  Surgeon: Lin Landsman, MD;  Location: [facility] ENDOSCOPY;  Service: Gastroenterology;  Laterality: N/A;   EYE SURGERY     TONSILLECTOMY      Medications Prior to Admission  Medication Sig Dispense Refill Last Dose   acetaminophen (TYLENOL) 500 MG tablet Take 1,000 mg  by mouth every 8 (eight) hours as needed.   prn at prn   albuterol (PROVENTIL) (2.5 MG/3ML) 0.083% nebulizer solution Take 3 mLs (2.5 mg total) by nebulization every 6 (six) hours as needed for wheezing or shortness of breath. 60 mL 1 prn at prn   ARIPiprazole (ABILIFY) 5 MG tablet Take 1 tablet (5 mg total) by mouth daily. 30 tablet 1 03/17/2022 at 2230   gabapentin (NEURONTIN) 300 MG capsule TAKE 1 CAPSULE BY MOUTH TWICE DAILY (Patient taking differently: Take 300 mg by mouth 3 (three) times daily.) 180 capsule 1 03/19/2022 at 0900   hydrOXYzine (ATARAX/VISTARIL) 10 MG tablet Take 1 tablet (10 mg total) by mouth 3 (three) times daily as needed for anxiety. 270 tablet 3 prn at prn   Magnesium Hydroxide (MAGNESIA PO) Take 3 capsules by mouth daily as needed.   prn at prn   rosuvastatin (CRESTOR) 5 MG tablet Take 1 tablet (5 mg total) by mouth at bedtime. 90 tablet 3 03/18/2022 at 2230   SPIRIVA HANDIHALER 18 MCG inhalation capsule Place 1 capsule (18 mcg total) into inhaler and inhale daily. (Patient taking differently: Place 18 mcg into inhaler and inhale daily as needed. Place 1 capsule (18 mcg total) into inhaler and inhale daily.) 90 capsule 1 prn at prn   venlafaxine XR (EFFEXOR-XR) 150 MG 24 hr capsule Take total of 225 mg daily, along with 75 mg cap 90 capsule 3 03/19/2022 at 0930   venlafaxine XR (EFFEXOR-XR) 75 MG 24 hr capsule Take total of 225 mg daily, along  with 75 mg cap 90 capsule 3 03/19/2022 at 0930    Social History   Socioeconomic History   Marital status: Widowed    Spouse name: Not on file   Number of children: Not on file   Years of education: Not on file   Highest education level: Some college, no degree  Occupational History   Occupation: retired  Tobacco Use   Smoking status: Every Day    Packs/day: 1.00    Years: 45.00    Total pack years: 45.00    Types: Cigarettes   Smokeless tobacco: Never  Vaping Use   Vaping Use: Never used  Substance and Sexual Activity    Alcohol use: No    Alcohol/week: 0.0 standard drinks of alcohol   Drug use: No   Sexual activity: Not Currently  Other Topics Concern   Not on file  Social History Narrative   Goes to dinner with friends often, goes to see her mom at nursing home on a regular basis .         Pt plans on taking a trip to [place] 05/2022.   Social Determinants of Health   Financial Resource Strain: Low Risk  (11/16/2021)   Overall Financial Resource Strain (CARDIA)    Difficulty of Paying Living Expenses: Not hard at all  Food Insecurity: No Food Insecurity (11/16/2021)   Hunger Vital Sign    Worried About Running Out of Food in the Last Year: Never true    Ran Out of Food in the Last Year: Never true  Transportation Needs: No Transportation Needs (11/16/2021)   PRAPARE - Hydrologist (Medical): No    Lack of Transportation (Non-Medical): No  Physical Activity: Inactive (11/16/2021)   Exercise Vital Sign    Days of Exercise per Week: 0 days    Minutes of Exercise per Session: 0 min  Stress: Stress Concern Present (11/16/2021)   [place]    Feeling of Stress : To some extent  Social Connections: Socially Isolated (11/16/2021)   Social Connection and Isolation Panel [NHANES]    Frequency of Communication with Friends and Family: Twice a week    Frequency of Social Gatherings with Friends and Family: Never    Attends Religious Services: Never    Marine scientist or Organizations: No    Attends Archivist Meetings: Never    Marital Status: Widowed  Intimate Partner Violence: Not At Risk (11/16/2021)   Humiliation, Afraid, Rape, and Kick questionnaire    Fear of Current or Ex-Partner: No    Emotionally Abused: No    Physically Abused: No    Sexually Abused: No    Family History  Problem Relation Age of Onset   Lymphoma Mother        Non Hodgkin   Dementia Mother    Alzheimer's disease Mother     Lymphoma Sister        Non Hodgkin   Depression Sister    Heart attack Father    Alcohol abuse Father    Dementia Brother    Dementia Maternal Uncle    Alzheimer's disease Maternal Aunt    Alzheimer's disease Paternal Aunt    Alzheimer's disease Paternal Grandmother    Alzheimer's disease Maternal Aunt    Alzheimer's disease Paternal Aunt       PHYSICAL EXAM General: Pleasant elderly Caucasian female, well nourished, in no acute distress.  Sitting upright in hospital bed  HEENT:  Normocephalic and atraumatic. Neck:  No JVD.  Lungs: Normal respiratory effort on 2 L by [place]. Poor air movement with crackles bilaterally R >L  Heart: Tachy but regular. Normal S1 and S2 without gallops or murmurs. Radial & DP pulses 2+ bilaterally. Abdomen: Non-distended appearing.  Msk: Normal strength and tone for age. Extremities: Warm and well perfused. No clubbing, cyanosis.  Trace bilateral LE pedal edema, improving Neuro: Alert and oriented X 3. Psych:  Answers questions appropriately.   Labs:   Lab Results  Component Value Date   WBC 8.2 03/20/2022   HGB 12.3 03/20/2022   HCT 40.9 03/20/2022   MCV 88.5 03/20/2022   PLT 414 (H) 03/20/2022    Recent Labs  Lab 03/19/22 1801 03/20/22 0500 03/22/22 0622  NA 138   < > 138  K 4.6   < > 3.2*  CL 100   < > 96*  CO2 27   < > 37*  BUN 9   < > 10  CREATININE 0.73   < > 0.76  CALCIUM 8.3*   < > 8.2*  PROT 6.4*  --   --   BILITOT 0.5  --   --   ALKPHOS 140*  --   --   ALT 26  --   --   AST 28  --   --   GLUCOSE 81   < > 90   < > = values in this interval not displayed.    No results found for: "CKTOTAL", "CKMB", "CKMBINDEX", "TROPONINI"  Lab Results  Component Value Date   CHOL 267 (H) 01/08/2022   CHOL 242 (H) 01/09/2021   CHOL 275 (H) 11/27/2018   Lab Results  Component Value Date   HDL 62 01/08/2022   HDL 50 01/09/2021   HDL 56 11/27/2018   Lab Results  Component Value Date   LDLCALC 173 (H) 01/08/2022   LDLCALC 159  (H) 01/09/2021   LDLCALC 178 (H) 11/27/2018   Lab Results  Component Value Date   TRIG 167 (H) 01/08/2022   TRIG 177 (H) 01/09/2021   TRIG 240 (H) 11/27/2018   Lab Results  Component Value Date   CHOLHDL 4.3 01/08/2022   CHOLHDL 4.8 01/09/2021   CHOLHDL 4.9 11/27/2018   No results found for: "LDLDIRECT"    Radiology: ECHOCARDIOGRAM COMPLETE  Result Date: 03/20/2022    ECHOCARDIOGRAM REPORT   Patient Name:   TIOMBE TOMEO Date of Exam: 03/20/2022 Medical Rec #:  [id number]         Height:       58.0 in Accession #:    [id number]        Weight:       164.0 lb Date of Birth:  1946/03/07         BSA:          1.674 m Patient Age:    50 years          BP:           103/68 mmHg Patient Gender: F                 HR:           102 bpm. Exam Location:  [facility] Procedure: 2D Echo, Cardiac Doppler and Color Doppler Indications:     CHF-acute diastolic H96.22  History:         Patient has no prior history of Echocardiogram examinations.  COPD. High cholesterol.  Sonographer:     Sherrie Sport Referring Phys:  [id number] JAN A MANSY Diagnosing Phys: Harrell Gave End MD  Sonographer Comments: Technically challenging study due to limited acoustic windows and no apical window. IMPRESSIONS  1. Left ventricular ejection fraction, by estimation, is 55 to 60%. The left ventricle has normal function. The left ventricle demonstrates regional wall motion abnormalities (see scoring diagram/findings for description). Left ventricular diastolic function could not be evaluated. There is the interventricular septum is flattened in diastole ('D' shaped left ventricle), consistent with right ventricular volume overload. There is akinesis of the left ventricular, basal inferior segment.  2. Right ventricular systolic function was not well visualized. The right ventricular size is not well visualized.  3. The mitral valve is abnormal. No evidence of mitral valve regurgitation. Moderate mitral annular calcification.  4.  The aortic valve was not well visualized. FINDINGS  Left Ventricle: Left ventricular ejection fraction, by estimation, is 55 to 60%. The left ventricle has normal function. The left ventricle demonstrates regional wall motion abnormalities. The left ventricular internal cavity size was normal in size. There is borderline left ventricular hypertrophy. The interventricular septum is flattened in diastole ('D' shaped left ventricle), consistent with right ventricular volume overload. Left ventricular diastolic function could not be evaluated. Right Ventricle: The right ventricular size is not well visualized. No increase in right ventricular wall thickness. Right ventricular systolic function was not well visualized. Left Atrium: Left atrial size was not well visualized. Right Atrium: Right atrial size was not well visualized. Pericardium: The pericardium was not well visualized. Mitral Valve: The mitral valve is abnormal. Moderate mitral annular calcification. No evidence of mitral valve regurgitation. Tricuspid Valve: The tricuspid valve is not well visualized. Tricuspid valve regurgitation is trivial. Aortic Valve: Poor apical windows limit assessment for aortic regurgitation and stenosis. The aortic valve was not well visualized. Pulmonic Valve: The pulmonic valve was not well visualized. Pulmonic valve regurgitation is not visualized. No evidence of pulmonic stenosis. Aorta: The aortic root is normal in size and structure. Pulmonary Artery: The pulmonary artery is not well seen. Venous: The inferior vena cava was not well visualized. IAS/Shunts: The interatrial septum was not well visualized.  LEFT VENTRICLE PLAX 2D LVIDd:         4.60 cm LVIDs:         3.30 cm LV PW:         1.00 cm LV IVS:        0.90 cm LVOT diam:     2.00 cm LVOT Area:     3.14 cm  LEFT ATRIUM         Index LA diam:    4.20 cm 2.51 cm/m   AORTA Ao Root diam: 3.10 cm TRICUSPID VALVE TR Peak grad:   16.6 mmHg TR Vmax:        204.00 cm/s   SHUNTS Systemic Diam: 2.00 cm Nelva Bush MD Electronically signed by Nelva Bush MD Signature Date/Time: 03/20/2022/11:33:02 AM    Final    CT Angio Chest PE W and/or Wo Contrast  Result Date: 03/20/2022 CLINICAL DATA:  Pulmonary embolism (PE) suspected, high prob EXAM: CT ANGIOGRAPHY CHEST WITH CONTRAST TECHNIQUE: Multidetector CT imaging of the chest was performed using the standard protocol during bolus administration of intravenous contrast. Multiplanar CT image reconstructions and MIPs were obtained to evaluate the vascular anatomy. RADIATION DOSE REDUCTION: This exam was performed according to the departmental dose-optimization program which includes automated exposure control, adjustment of the mA  and/or kV according to patient size and/or use of iterative reconstruction technique. CONTRAST:  72m OMNIPAQUE IOHEXOL 350 MG/ML SOLN COMPARISON:  01/02/2022 FINDINGS: Cardiovascular: Small filling defect seen within a posterior right lower lobe pulmonary arterial branch on image 79 of series 4 suggesting very small nonocclusive pulmonary embolus. No additional filling defects noted. Heart is normal size. Diffuse coronary artery calcifications. Moderate aortic calcifications. No aneurysm. Mediastinum/Nodes: No mediastinal, hilar, or axillary adenopathy. Trachea and esophagus are unremarkable. Thyroid unremarkable. Lungs/Pleura: Moderate right pleural effusion. Atelectasis in the right middle lobe and right lower lobe. Minimal left base atelectasis with no effusion on the left. Upper Abdomen: Imaging into the upper abdomen demonstrates no acute findings. Musculoskeletal: Chest wall soft tissues are unremarkable. No acute bony abnormality. Review of the MIP images confirms the above findings. IMPRESSION: Very small filling defect within a left lower lobe pulmonary arterial branch compatible with small nonocclusive pulmonary embolus. Moderate right pleural effusion. Right middle lobe and lower lobe  atelectasis. Extensive coronary artery disease. Aortic Atherosclerosis (ICD10-I70.0). Electronically Signed   By: KRolm BaptiseM.D.   On: 03/20/2022 00:02   UKoreaVenous Img Lower Bilateral  Result Date: 03/19/2022 CLINICAL DATA:  Leg swelling EXAM: BILATERAL LOWER EXTREMITY VENOUS DOPPLER ULTRASOUND TECHNIQUE: Gray-scale sonography with compression, as well as color and duplex ultrasound, were performed to evaluate the deep venous system(s) from the level of the common femoral vein through the popliteal and proximal calf veins. COMPARISON:  None Available. FINDINGS: VENOUS Normal compressibility of the common femoral, superficial femoral, and popliteal veins, as well as the visualized calf veins. Visualized portions of profunda femoral vein and great saphenous vein unremarkable. No filling defects to suggest DVT on grayscale or color Doppler imaging. Doppler waveforms show normal direction of venous flow, normal respiratory plasticity and response to augmentation. OTHER None. Limitations: Visualization of the calf veins due to edema. IMPRESSION: No evidence of lower extremity DVT. Electronically Signed   By: KRolm BaptiseM.D.   On: 03/19/2022 23:41   DG Chest 2 View  Result Date: 03/19/2022 CLINICAL DATA:  Increased swelling in legs, shortness of breath, oxygen desaturation EXAM: CHEST - 2 VIEW COMPARISON:  02/16/2016 FINDINGS: Enlargement of cardiac silhouette with pulmonary vascular congestion. Atherosclerotic calcification aorta. RIGHT pleural effusion and basilar atelectasis. Mild interstitial prominence versus previous exam question minimal pulmonary edema. At emphysematous changes and chronic central peribronchial thickening. No pneumothorax or acute osseous findings. IMPRESSION: Enlargement of cardiac silhouette with pulmonary vascular congestion and question minimal pulmonary edema. COPD changes with RIGHT pleural effusion and basilar atelectasis. Aortic Atherosclerosis (ICD10-I70.0). Emphysema  (ICD10-J43.9). Electronically Signed   By: MLavonia DanaM.D.   On: 03/19/2022 18:28    ECHO LVEF 55-60%, interventricular septum flattened in diastole c/w RV overload, akinesis of left ventricular, basilar inferior segment  TELEMETRY reviewed by me: Sinus tachycardia with rates in the low 100s to 110  EKG reviewed by me: sinus tach rate 108 T wave inversions in lead III and aVF and flattening anterolaterally  ASSESSMENT AND PLAN:  CTrecia Maringis a 89yoFwith a PMH of COPD, ongoing tobacco use, depression, hyperlipidemia, chronic back pain [facility] and SOB. Cardiology is consulted for assistance with possible new onset heart failure.   #Acute hypoxic respiratory failure #Newly diagnosed HFpEF (LVEF 55-60%, LV basal inferior wall akinesis) #Hyperlipidemia The patient presents with 1 week of lower extremity swelling up to her knees, orthopnea, and shortness of breath on top of her chronic dyspnea  on exertion.  Her BNP is elevated to 800 with a right pleural effusion and vascular congestion on chest x-ray, and clinical volume overload and her lower legs with a new oxygen requirement. incidentally, she has a inferior basilar wall motion abnormality, she has risk factors for CAD including ongoing tobacco use and hyperlipidemia, with an extensive family history of CAD.  She has never had an ischemic work-up in the outpatient setting.  Also incidentally, a very small left lower lobe filling defect was noted on CTA chest and extensive coronary artery calcifications. -S/p IV Lasix 40 mg x6 doses with excellent UOP -Continue Lasix 40 mg BID daily for now -continue aspirin 81 mg daily -continue rosuvastatin 20 mg daily -Initiate beta blocker for GDMT with metoprolol XL 12.'5mg'$  today -wean oxygen as tolerated, defer management of small PE to primary team -- planning observation  -strict I/O, daily weights -discussed low sodium, heart healthy diet -will need  outpatient cardiology follow up at discharge for consideration of ischemic evaluation  #COPD #tobacco abuse -Discussed smoking cessation at length with patient, she has tried to quit multiple times in the past (including hypnosis) currently smokes 1 pack/day, doing well with nicotine patches and is interested in continuing them at discharg efor cessation   This patient's plan of care was discussed and created with Dr. Nehemiah Massed and he is in agreement.  Signed: Tristan Schroeder , PA-C 03/22/2022, 9:55 AM [facility]

## 2022-03-22 NOTE — Progress Notes (Signed)
Tried to wean patient off O2.  Patient O2 saturations dropped to 87% on room air.   Placed back on 2 L oxygen.  Incentive spirometer given.  Paged MD  Per MD- place on 1 L and verbal order given to give a flutter valve.    Will continue to monitor

## 2022-03-23 ENCOUNTER — Telehealth: Payer: Self-pay

## 2022-03-23 DIAGNOSIS — I5033 Acute on chronic diastolic (congestive) heart failure: Secondary | ICD-10-CM | POA: Diagnosis not present

## 2022-03-23 DIAGNOSIS — I2693 Single subsegmental pulmonary embolism without acute cor pulmonale: Secondary | ICD-10-CM | POA: Diagnosis not present

## 2022-03-23 DIAGNOSIS — J9601 Acute respiratory failure with hypoxia: Secondary | ICD-10-CM | POA: Diagnosis not present

## 2022-03-23 LAB — BASIC METABOLIC PANEL
Anion gap: 7 (ref 5–15)
BUN: 13 mg/dL (ref 8–23)
CO2: 33 mmol/L — ABNORMAL HIGH (ref 22–32)
Calcium: 8.4 mg/dL — ABNORMAL LOW (ref 8.9–10.3)
Chloride: 98 mmol/L (ref 98–111)
Creatinine, Ser: 0.8 mg/dL (ref 0.44–1.00)
GFR, Estimated: >60 mL/min (ref >60)
Glucose, Bld: 98 mg/dL (ref 70–99)
Potassium: 4.6 mmol/L (ref 3.5–5.1)
Sodium: 138 mmol/L (ref 135–145)

## 2022-03-23 LAB — MAGNESIUM: Magnesium: 2.1 mg/dL (ref 1.7–2.4)

## 2022-03-23 MED ORDER — METOPROLOL SUCCINATE ER 25 MG PO TB24: 12.5000 mg | ORAL_TABLET | Freq: Every day | ORAL | 0 refills | Status: DC

## 2022-03-23 MED ORDER — ORAL CARE MOUTH RINSE: 15.0000 mL | OROMUCOSAL | Status: DC | PRN

## 2022-03-23 MED ORDER — POTASSIUM CHLORIDE CRYS ER 20 MEQ PO TBCR: 20.0000 meq | EXTENDED_RELEASE_TABLET | Freq: Every day | ORAL | 0 refills | Status: AC

## 2022-03-23 MED ORDER — POTASSIUM CHLORIDE CRYS ER 20 MEQ PO TBCR: 20.0000 meq | EXTENDED_RELEASE_TABLET | Freq: Every day | ORAL | Status: DC

## 2022-03-23 MED ORDER — FUROSEMIDE 20 MG PO TABS: 20.0000 mg | ORAL_TABLET | Freq: Every day | ORAL | Status: DC

## 2022-03-23 MED ORDER — FUROSEMIDE 40 MG PO TABS: 40.0000 mg | ORAL_TABLET | Freq: Every day | ORAL | 0 refills | Status: AC

## 2022-03-23 MED ORDER — FUROSEMIDE 40 MG PO TABS: 40.0000 mg | ORAL_TABLET | Freq: Every day | ORAL | Status: DC

## 2022-03-23 NOTE — Care Management Important Message (Signed)
Important Message  Patient Details  Name: Mackenzie Ward MRN: [id number] Date of Birth: May 30, 1946   Medicare Important Message Given:  Yes     Dannette Barbara 03/23/2022, 1:02 PM

## 2022-03-23 NOTE — Discharge Summary (Signed)
Physician Discharge Summary   Patient: Mackenzie Ward MRN: [id number] DOB: 1945-12-28  Admit date:     03/19/2022  Discharge date: 03/23/22  Discharge Physician: Sharen Hones   PCP: Olin Hauser, DO   Recommendations at discharge:   Follow-up with PCP and cardiology in 1 week  Discharge Diagnoses: Active Problems:   Acute respiratory failure with hypoxia (HCC)   Chronic obstructive pulmonary disease (COPD) (HCC)   Tobacco abuse   Depression   Centrilobular emphysema (HCC)   Bipolar disorder in remission ([place])   Acute on chronic diastolic CHF (congestive heart failure) (HCC)   Obesity (BMI 30-39.9)   Reactive thrombocytosis   Single subsegmental pulmonary embolism without acute cor pulmonale (HCC)  Resolved Problems:   * No resolved hospital problems. *  Hospital Course: LEEANNA SLABY is a 76 y.o. female with medical history significant for asthma and COPD as well as depression and dyslipidemia, presented to the emergency room with a  worsening dyspnea with associated orthopnea since last Wednesday as well as dry cough and wheezing.  He continues to smoke cigarettes.  Upon arriving the hospital, her oxygen saturation was 89% on room air, he was placed on 2 L oxygen.  Chest x-ray showed cardiomegaly with pulmonary vascular congestion, COPD changes.  BNP elevated 85 5.  Patient is diagnosed with acute on chronic congestive heart failure.  She is giving IV Lasix. Echocardiogram performed on 7/11 showed ejection fraction 40-98%, diastolic function cannot be evaluated, but the patient has evidence of right-sided volume overload.  Assessment and Plan: Acute respiratory failure with hypoxemia. Acute on chronic diastolic congestive heart failure. Elevated troponin secondary to congestive heart failure. Condition much improved, volume status better.  She was able to walk with physical therapy, she was still requiring 1 L oxygen, but oxygen improved to 93% after ambulation.   But she still has hypoxemia at rest, she will be prescribed 1 L oxygen at discharge.  Discussed with cardiology, patient is medically stable to be discharged from their standpoint.  We will continue Lasix 40 mg daily with added potassium 20 mEq daily.  Follow-up with PCP and cardiology in the near future.  Hypokalemia Improved.   Subsegmental PE. Reactive thrombocytosis. No need for anticoagulation at this time.   COPD. Tobacco abuse Stable, no bronchospasm. Advised to quit smoking.       Consultants: Cardiology Procedures performed: None  Disposition: Home Diet recommendation:  Discharge Diet Orders (From admission, onward)     Start     Ordered   03/23/22 0000  Diet - low sodium heart healthy        03/23/22 1048           Cardiac diet DISCHARGE MEDICATION: Allergies as of 03/23/2022   No Known Allergies      Medication List     TAKE these medications    acetaminophen 500 MG tablet Commonly known as: TYLENOL Take 1,000 mg by mouth every 8 (eight) hours as needed.   albuterol (2.5 MG/3ML) 0.083% nebulizer solution Commonly known as: PROVENTIL Take 3 mLs (2.5 mg total) by nebulization every 6 (six) hours as needed for wheezing or shortness of breath.   ARIPiprazole 5 MG tablet Commonly known as: ABILIFY Take 1 tablet (5 mg total) by mouth daily.   furosemide 40 MG tablet Commonly known as: LASIX Take 1 tablet (40 mg total) by mouth daily. Start taking on: March 24, 2022   gabapentin 300 MG capsule Commonly known as: NEURONTIN TAKE 1 CAPSULE  BY MOUTH TWICE DAILY What changed: when to take this   hydrOXYzine 10 MG tablet Commonly known as: ATARAX Take 1 tablet (10 mg total) by mouth 3 (three) times daily as needed for anxiety.   MAGNESIA PO Take 3 capsules by mouth daily as needed.   metoprolol succinate 25 MG 24 hr tablet Commonly known as: TOPROL-XL Take 0.5 tablets (12.5 mg total) by mouth daily. Start taking on: March 24, 2022    potassium chloride SA 20 MEQ tablet Commonly known as: KLOR-CON M Take 1 tablet (20 mEq total) by mouth daily. Start taking on: March 24, 2022   rosuvastatin 5 MG tablet Commonly known as: Crestor Take 1 tablet (5 mg total) by mouth at bedtime.   Spiriva HandiHaler 18 MCG inhalation capsule Generic drug: tiotropium Place 1 capsule (18 mcg total) into inhaler and inhale daily. What changed:  how much to take when to take this reasons to take this additional instructions   venlafaxine XR 150 MG 24 hr capsule Commonly known as: EFFEXOR-XR Take total of 225 mg daily, along with 75 mg cap What changed: Another medication with the same name was removed. Continue taking this medication, and follow the directions you see here.        Follow-up Information     Corey Skains, MD. Go in 1 week(s).   Specialty: Cardiology Contact information: [address] [facility] [place] [address] [place], DO Follow up in 1 week(s).   Specialty: Family Medicine Contact information: [place] [address] [phone]                Discharge Exam: Filed Weights   03/21/22 0433 03/22/22 0327 03/23/22 0511  Weight: 71.9 kg 68.4 kg 68.2 kg   General exam: Appears calm and comfortable  Respiratory system: Minimal crackles in left base much improved. Respiratory effort normal. Cardiovascular system: S1 & S2 heard, RRR. No JVD, murmurs, rubs, gallops or clicks. No pedal edema. Gastrointestinal system: Abdomen is nondistended, soft and nontender. No organomegaly or masses felt. Normal bowel sounds heard. Central nervous system: Alert and oriented. No focal neurological deficits. Extremities: Symmetric 5 x 5 power. Skin: No rashes, lesions or ulcers Psychiatry: Judgement and insight appear normal. Mood & affect appropriate.    Condition at discharge: good  The results of significant diagnostics  from this hospitalization (including imaging, microbiology, ancillary and laboratory) are listed below for reference.   Imaging Studies: ECHOCARDIOGRAM COMPLETE  Result Date: 03/20/2022    ECHOCARDIOGRAM REPORT   Patient Name:   ULANDA TACKETT Date of Exam: 03/20/2022 Medical Rec #:  [id number]         Height:       58.0 in Accession #:    [id number]        Weight:       164.0 lb Date of Birth:  Sep 24, 1945         BSA:          1.674 m Patient Age:    76 years          BP:           103/68 mmHg Patient Gender: F                 HR:           102 bpm. Exam Location:  [facility] Procedure: 2D Echo, Cardiac Doppler and Color Doppler Indications:  CHF-acute diastolic R60.45  History:         Patient has no prior history of Echocardiogram examinations.                  COPD. High cholesterol.  Sonographer:     Sherrie Sport Referring Phys:  [id number] JAN A MANSY Diagnosing Phys: Harrell Gave End MD  Sonographer Comments: Technically challenging study due to limited acoustic windows and no apical window. IMPRESSIONS  1. Left ventricular ejection fraction, by estimation, is 55 to 60%. The left ventricle has normal function. The left ventricle demonstrates regional wall motion abnormalities (see scoring diagram/findings for description). Left ventricular diastolic function could not be evaluated. There is the interventricular septum is flattened in diastole ('D' shaped left ventricle), consistent with right ventricular volume overload. There is akinesis of the left ventricular, basal inferior segment.  2. Right ventricular systolic function was not well visualized. The right ventricular size is not well visualized.  3. The mitral valve is abnormal. No evidence of mitral valve regurgitation. Moderate mitral annular calcification.  4. The aortic valve was not well visualized. FINDINGS  Left Ventricle: Left ventricular ejection fraction, by estimation, is 55 to 60%. The left ventricle has normal function. The left ventricle  demonstrates regional wall motion abnormalities. The left ventricular internal cavity size was normal in size. There is borderline left ventricular hypertrophy. The interventricular septum is flattened in diastole ('D' shaped left ventricle), consistent with right ventricular volume overload. Left ventricular diastolic function could not be evaluated. Right Ventricle: The right ventricular size is not well visualized. No increase in right ventricular wall thickness. Right ventricular systolic function was not well visualized. Left Atrium: Left atrial size was not well visualized. Right Atrium: Right atrial size was not well visualized. Pericardium: The pericardium was not well visualized. Mitral Valve: The mitral valve is abnormal. Moderate mitral annular calcification. No evidence of mitral valve regurgitation. Tricuspid Valve: The tricuspid valve is not well visualized. Tricuspid valve regurgitation is trivial. Aortic Valve: Poor apical windows limit assessment for aortic regurgitation and stenosis. The aortic valve was not well visualized. Pulmonic Valve: The pulmonic valve was not well visualized. Pulmonic valve regurgitation is not visualized. No evidence of pulmonic stenosis. Aorta: The aortic root is normal in size and structure. Pulmonary Artery: The pulmonary artery is not well seen. Venous: The inferior vena cava was not well visualized. IAS/Shunts: The interatrial septum was not well visualized.  LEFT VENTRICLE PLAX 2D LVIDd:         4.60 cm LVIDs:         3.30 cm LV PW:         1.00 cm LV IVS:        0.90 cm LVOT diam:     2.00 cm LVOT Area:     3.14 cm  LEFT ATRIUM         Index LA diam:    4.20 cm 2.51 cm/m   AORTA Ao Root diam: 3.10 cm TRICUSPID VALVE TR Peak grad:   16.6 mmHg TR Vmax:        204.00 cm/s  SHUNTS Systemic Diam: 2.00 cm Nelva Bush MD Electronically signed by Nelva Bush MD Signature Date/Time: 03/20/2022/11:33:02 AM    Final    CT Angio Chest PE W and/or Wo  Contrast  Result Date: 03/20/2022 CLINICAL DATA:  Pulmonary embolism (PE) suspected, high prob EXAM: CT ANGIOGRAPHY CHEST WITH CONTRAST TECHNIQUE: Multidetector CT imaging of the chest was performed using the standard protocol during bolus administration  of intravenous contrast. Multiplanar CT image reconstructions and MIPs were obtained to evaluate the vascular anatomy. RADIATION DOSE REDUCTION: This exam was performed according to the departmental dose-optimization program which includes automated exposure control, adjustment of the mA and/or kV according to patient size and/or use of iterative reconstruction technique. CONTRAST:  33m OMNIPAQUE IOHEXOL 350 MG/ML SOLN COMPARISON:  01/02/2022 FINDINGS: Cardiovascular: Small filling defect seen within a posterior right lower lobe pulmonary arterial branch on image 79 of series 4 suggesting very small nonocclusive pulmonary embolus. No additional filling defects noted. Heart is normal size. Diffuse coronary artery calcifications. Moderate aortic calcifications. No aneurysm. Mediastinum/Nodes: No mediastinal, hilar, or axillary adenopathy. Trachea and esophagus are unremarkable. Thyroid unremarkable. Lungs/Pleura: Moderate right pleural effusion. Atelectasis in the right middle lobe and right lower lobe. Minimal left base atelectasis with no effusion on the left. Upper Abdomen: Imaging into the upper abdomen demonstrates no acute findings. Musculoskeletal: Chest wall soft tissues are unremarkable. No acute bony abnormality. Review of the MIP images confirms the above findings. IMPRESSION: Very small filling defect within a left lower lobe pulmonary arterial branch compatible with small nonocclusive pulmonary embolus. Moderate right pleural effusion. Right middle lobe and lower lobe atelectasis. Extensive coronary artery disease. Aortic Atherosclerosis (ICD10-I70.0). Electronically Signed   By: KRolm BaptiseM.D.   On: 03/20/2022 00:02   UKoreaVenous Img Lower  Bilateral  Result Date: 03/19/2022 CLINICAL DATA:  Leg swelling EXAM: BILATERAL LOWER EXTREMITY VENOUS DOPPLER ULTRASOUND TECHNIQUE: Gray-scale sonography with compression, as well as color and duplex ultrasound, were performed to evaluate the deep venous system(s) from the level of the common femoral vein through the popliteal and proximal calf veins. COMPARISON:  None Available. FINDINGS: VENOUS Normal compressibility of the common femoral, superficial femoral, and popliteal veins, as well as the visualized calf veins. Visualized portions of profunda femoral vein and great saphenous vein unremarkable. No filling defects to suggest DVT on grayscale or color Doppler imaging. Doppler waveforms show normal direction of venous flow, normal respiratory plasticity and response to augmentation. OTHER None. Limitations: Visualization of the calf veins due to edema. IMPRESSION: No evidence of lower extremity DVT. Electronically Signed   By: KRolm BaptiseM.D.   On: 03/19/2022 23:41   DG Chest 2 View  Result Date: 03/19/2022 CLINICAL DATA:  Increased swelling in legs, shortness of breath, oxygen desaturation EXAM: CHEST - 2 VIEW COMPARISON:  02/16/2016 FINDINGS: Enlargement of cardiac silhouette with pulmonary vascular congestion. Atherosclerotic calcification aorta. RIGHT pleural effusion and basilar atelectasis. Mild interstitial prominence versus previous exam question minimal pulmonary edema. At emphysematous changes and chronic central peribronchial thickening. No pneumothorax or acute osseous findings. IMPRESSION: Enlargement of cardiac silhouette with pulmonary vascular congestion and question minimal pulmonary edema. COPD changes with RIGHT pleural effusion and basilar atelectasis. Aortic Atherosclerosis (ICD10-I70.0). Emphysema (ICD10-J43.9). Electronically Signed   By: MLavonia DanaM.D.   On: 03/19/2022 18:28    Microbiology: Results for orders placed or performed during the hospital encounter of 03/19/22   SARS Coronavirus 2 by RT PCR (hospital order[facility]) *cepheid single result test* Anterior Nasal Swab     Status: None   Collection Time: 03/19/22 10:59 PM   Specimen: Anterior Nasal Swab  Result Value Ref Range Status   SARS Coronavirus 2 by RT PCR NEGATIVE NEGATIVE Final    Comment: (NOTE) SARS-CoV-2 target nucleic acids are NOT DETECTED.  The SARS-CoV-2 RNA is generally detectable in upper and lower respiratory specimens during the acute phase of infection.  The lowest concentration of SARS-CoV-2 viral copies this assay can detect is 250 copies / mL. A negative result does not preclude SARS-CoV-2 infection and should not be used as the sole basis for treatment or other patient management decisions.  A negative result may occur with improper specimen collection / handling, submission of specimen other than nasopharyngeal swab, presence of viral mutation(s) within the areas targeted by this assay, and inadequate number of viral copies (<250 copies / mL). A negative result must be combined with clinical observations, patient history, and epidemiological information.  Fact Sheet for Patients:   https://www.patel.info/  Fact Sheet for Healthcare Providers: https://hall.com/  This test is not yet approved or  cleared by the [place] FDA and has been authorized for detection and/or diagnosis of SARS-CoV-2 by FDA under an Emergency Use Authorization (EUA).  This EUA will remain in effect (meaning this test can be used) for the duration of the COVID-19 declaration under Section 564(b)(1) of the Act, 21 U.S.C. section 360bbb-3(b)(1), unless the authorization is terminated or revoked sooner.  Performed at [facility], [address] [place], [place] [address]     Labs: CBC: Recent Labs  Lab 03/19/22 1801 03/20/22 0500  WBC 7.8 8.2  NEUTROABS 5.0  --   HGB 12.3 12.3  HCT 39.9 40.9  MCV  87.9 88.5  PLT 443* 784*   Basic Metabolic Panel: Recent Labs  Lab 03/19/22 1801 03/20/22 0500 03/22/22 0622 03/23/22 0316  NA 138 141 138 138  K 4.6 4.1 3.2* 4.6  CL 100 102 96* 98  CO2 27 30 37* 33*  GLUCOSE 81 86 90 98  BUN '9 8 10 13  '$ CREATININE 0.73 0.64 0.76 0.80  CALCIUM 8.3* 8.1* 8.2* 8.4*  MG  --   --  2.0 2.1   Liver Function Tests: Recent Labs  Lab 03/19/22 1801  AST 28  ALT 26  ALKPHOS 140*  BILITOT 0.5  PROT 6.4*  ALBUMIN 2.6*   CBG: No results for input(s): "GLUCAP" in the last 168 hours.  Discharge time spent: greater than 30 minutes.  Signed: Sharen Hones, MD [facility] 03/23/2022

## 2022-03-23 NOTE — Plan of Care (Signed)
  Problem: Cardiac: Goal: Ability to achieve and maintain adequate cardiopulmonary perfusion will improve Outcome: Progressing Resting without distress this shift.  Oxygen saturations remain decreased on room air with improvements noted on room air.

## 2022-03-23 NOTE — Progress Notes (Signed)
[place] NOTE       Patient ID: Mackenzie Ward MRN: [id number] DOB/AGE: June 23, 1946 76 y.o.  Admit date: 03/19/2022 Referring Physician Dr. Eugenie Norrie Primary Physician Dr. Nobie Putnam Primary Cardiologist none Reason for Consultation CHF  HPI: Mackenzie Ward is a 65yoF with a PMH of COPD, ongoing tobacco use, depression, hyperlipidemia, chronic back pain who presented to [facility] ED 03/19/22 with leg swelling and SOB. Cardiology is consulted for assistance with new onset heart failure.   Interval History: -No acute events -She is in good spirits, motivated to quit smoking and take her medicines.  Her breathing continues to feel much better, ambulated with physical therapy yesterday and did well on 1 L by [place] -No chest pain, palpitations, dizziness   Review of systems complete and found to be negative unless listed above    Past Medical History:  Diagnosis Date   Asthma    COPD (chronic obstructive pulmonary disease) ([place])    Depression    High cholesterol     Past Surgical History:  Procedure Laterality Date   BREAST SURGERY Right 2013   CATARACT EXTRACTION W/PHACO Left 08/26/2019   Procedure: CATARACT EXTRACTION PHACO AND INTRAOCULAR LENS PLACEMENT (IOC) LEFT  4.45    00:40.5    11.0%;  Surgeon: Leandrew Koyanagi, MD;  Location: [facility];  Service: Ophthalmology;  Laterality: Left;   CATARACT EXTRACTION W/PHACO Right 09/30/2019   Procedure: CATARACT EXTRACTION PHACO AND INTRAOCULAR LENS PLACEMENT (IOC) RIGHT 4.05  00:44.4  9.1% ;  Surgeon: Leandrew Koyanagi, MD;  Location: [place];  Service: Ophthalmology;  Laterality: Right;   COLONOSCOPY  15 years ago   COLONOSCOPY WITH PROPOFOL N/A 11/10/2018   Procedure: COLONOSCOPY WITH PROPOFOL;  Surgeon: Lin Landsman, MD;  Location: [facility] ENDOSCOPY;  Service: Gastroenterology;  Laterality: N/A;   EYE SURGERY     TONSILLECTOMY      Medications Prior to Admission   Medication Sig Dispense Refill Last Dose   acetaminophen (TYLENOL) 500 MG tablet Take 1,000 mg by mouth every 8 (eight) hours as needed.   prn at prn   albuterol (PROVENTIL) (2.5 MG/3ML) 0.083% nebulizer solution Take 3 mLs (2.5 mg total) by nebulization every 6 (six) hours as needed for wheezing or shortness of breath. 60 mL 1 prn at prn   ARIPiprazole (ABILIFY) 5 MG tablet Take 1 tablet (5 mg total) by mouth daily. 30 tablet 1 03/17/2022 at 2230   gabapentin (NEURONTIN) 300 MG capsule TAKE 1 CAPSULE BY MOUTH TWICE DAILY (Patient taking differently: Take 300 mg by mouth 3 (three) times daily.) 180 capsule 1 03/19/2022 at 0900   hydrOXYzine (ATARAX/VISTARIL) 10 MG tablet Take 1 tablet (10 mg total) by mouth 3 (three) times daily as needed for anxiety. 270 tablet 3 prn at prn   Magnesium Hydroxide (MAGNESIA PO) Take 3 capsules by mouth daily as needed.   prn at prn   rosuvastatin (CRESTOR) 5 MG tablet Take 1 tablet (5 mg total) by mouth at bedtime. 90 tablet 3 03/18/2022 at 2230   SPIRIVA HANDIHALER 18 MCG inhalation capsule Place 1 capsule (18 mcg total) into inhaler and inhale daily. (Patient taking differently: Place 18 mcg into inhaler and inhale daily as needed. Place 1 capsule (18 mcg total) into inhaler and inhale daily.) 90 capsule 1 prn at prn   venlafaxine XR (EFFEXOR-XR) 150 MG 24 hr capsule Take total of 225 mg daily, along with 75 mg cap 90 capsule 3 03/19/2022 at 0930  venlafaxine XR (EFFEXOR-XR) 75 MG 24 hr capsule Take total of 225 mg daily, along with 75 mg cap 90 capsule 3 03/19/2022 at 0930    Social History   Socioeconomic History   Marital status: Widowed    Spouse name: Not on file   Number of children: Not on file   Years of education: Not on file   Highest education level: Some college, no degree  Occupational History   Occupation: retired  Tobacco Use   Smoking status: Every Day    Packs/day: 1.00    Years: 45.00    Total pack years: 45.00    Types: Cigarettes    Smokeless tobacco: Never  Vaping Use   Vaping Use: Never used  Substance and Sexual Activity   Alcohol use: No    Alcohol/week: 0.0 standard drinks of alcohol   Drug use: No   Sexual activity: Not Currently  Other Topics Concern   Not on file  Social History Narrative   Goes to dinner with friends often, goes to see her mom at nursing home on a regular basis .         Pt plans on taking a trip to [place] 05/2022.   Social Determinants of Health   Financial Resource Strain: Low Risk  (11/16/2021)   Overall Financial Resource Strain (CARDIA)    Difficulty of Paying Living Expenses: Not hard at all  Food Insecurity: No Food Insecurity (11/16/2021)   Hunger Vital Sign    Worried About Running Out of Food in the Last Year: Never true    Ran Out of Food in the Last Year: Never true  Transportation Needs: No Transportation Needs (11/16/2021)   PRAPARE - Hydrologist (Medical): No    Lack of Transportation (Non-Medical): No  Physical Activity: Inactive (11/16/2021)   Exercise Vital Sign    Days of Exercise per Week: 0 days    Minutes of Exercise per Session: 0 min  Stress: Stress Concern Present (11/16/2021)   [place]    Feeling of Stress : To some extent  Social Connections: Socially Isolated (11/16/2021)   Social Connection and Isolation Panel [NHANES]    Frequency of Communication with Friends and Family: Twice a week    Frequency of Social Gatherings with Friends and Family: Never    Attends Religious Services: Never    Marine scientist or Organizations: No    Attends Archivist Meetings: Never    Marital Status: Widowed  Intimate Partner Violence: Not At Risk (11/16/2021)   Humiliation, Afraid, Rape, and Kick questionnaire    Fear of Current or Ex-Partner: No    Emotionally Abused: No    Physically Abused: No    Sexually Abused: No    Family History  Problem Relation Age  of Onset   Lymphoma Mother        Non Hodgkin   Dementia Mother    Alzheimer's disease Mother    Lymphoma Sister        Non Hodgkin   Depression Sister    Heart attack Father    Alcohol abuse Father    Dementia Brother    Dementia Maternal Uncle    Alzheimer's disease Maternal Aunt    Alzheimer's disease Paternal Aunt    Alzheimer's disease Paternal Grandmother    Alzheimer's disease Maternal Aunt    Alzheimer's disease Paternal Aunt       PHYSICAL EXAM General: Pleasant  elderly Caucasian female, well nourished, in no acute distress.  Sitting upright in hospital bed with physical therapist at bedside HEENT:  Normocephalic and atraumatic. Neck:  No JVD.  Lungs: Normal respiratory effort on  L by [place].  Somewhat improved aeration with trace with crackles bilaterally R >L  Heart: Regular rate and rhythm. Normal S1 and S2 without gallops or murmurs. Radial & DP pulses 2+ bilaterally. Abdomen: Non-distended appearing.  Msk: Normal strength and tone for age. Extremities: Warm and well perfused. No clubbing, cyanosis.  No peripheral l LE pedal edema, much improved since admission Neuro: Alert and oriented X 3. Psych:  Answers questions appropriately.   Labs:   Lab Results  Component Value Date   WBC 8.2 03/20/2022   HGB 12.3 03/20/2022   HCT 40.9 03/20/2022   MCV 88.5 03/20/2022   PLT 414 (H) 03/20/2022    Recent Labs  Lab 03/19/22 1801 03/20/22 0500 03/23/22 0316  NA 138   < > 138  K 4.6   < > 4.6  CL 100   < > 98  CO2 27   < > 33*  BUN 9   < > 13  CREATININE 0.73   < > 0.80  CALCIUM 8.3*   < > 8.4*  PROT 6.4*  --   --   BILITOT 0.5  --   --   ALKPHOS 140*  --   --   ALT 26  --   --   AST 28  --   --   GLUCOSE 81   < > 98   < > = values in this interval not displayed.    No results found for: "CKTOTAL", "CKMB", "CKMBINDEX", "TROPONINI"  Lab Results  Component Value Date   CHOL 267 (H) 01/08/2022   CHOL 242 (H) 01/09/2021   CHOL 275 (H) 11/27/2018   Lab  Results  Component Value Date   HDL 62 01/08/2022   HDL 50 01/09/2021   HDL 56 11/27/2018   Lab Results  Component Value Date   LDLCALC 173 (H) 01/08/2022   LDLCALC 159 (H) 01/09/2021   LDLCALC 178 (H) 11/27/2018   Lab Results  Component Value Date   TRIG 167 (H) 01/08/2022   TRIG 177 (H) 01/09/2021   TRIG 240 (H) 11/27/2018   Lab Results  Component Value Date   CHOLHDL 4.3 01/08/2022   CHOLHDL 4.8 01/09/2021   CHOLHDL 4.9 11/27/2018   No results found for: "LDLDIRECT"    Radiology: ECHOCARDIOGRAM COMPLETE  Result Date: 03/20/2022    ECHOCARDIOGRAM REPORT   Patient Name:   RYELEE ALBEE Date of Exam: 03/20/2022 Medical Rec #:  [id number]         Height:       58.0 in Accession #:    [id number]        Weight:       164.0 lb Date of Birth:  June 02, 1946         BSA:          1.674 m Patient Age:    38 years          BP:           103/68 mmHg Patient Gender: F                 HR:           102 bpm. Exam Location:  [facility] Procedure: 2D Echo, Cardiac Doppler and Color Doppler Indications:     CHF-acute diastolic  I50.31  History:         Patient has no prior history of Echocardiogram examinations.                  COPD. High cholesterol.  Sonographer:     Sherrie Sport Referring Phys:  [id number] JAN A MANSY Diagnosing Phys: Harrell Gave End MD  Sonographer Comments: Technically challenging study due to limited acoustic windows and no apical window. IMPRESSIONS  1. Left ventricular ejection fraction, by estimation, is 55 to 60%. The left ventricle has normal function. The left ventricle demonstrates regional wall motion abnormalities (see scoring diagram/findings for description). Left ventricular diastolic function could not be evaluated. There is the interventricular septum is flattened in diastole ('D' shaped left ventricle), consistent with right ventricular volume overload. There is akinesis of the left ventricular, basal inferior segment.  2. Right ventricular systolic function was not well  visualized. The right ventricular size is not well visualized.  3. The mitral valve is abnormal. No evidence of mitral valve regurgitation. Moderate mitral annular calcification.  4. The aortic valve was not well visualized. FINDINGS  Left Ventricle: Left ventricular ejection fraction, by estimation, is 55 to 60%. The left ventricle has normal function. The left ventricle demonstrates regional wall motion abnormalities. The left ventricular internal cavity size was normal in size. There is borderline left ventricular hypertrophy. The interventricular septum is flattened in diastole ('D' shaped left ventricle), consistent with right ventricular volume overload. Left ventricular diastolic function could not be evaluated. Right Ventricle: The right ventricular size is not well visualized. No increase in right ventricular wall thickness. Right ventricular systolic function was not well visualized. Left Atrium: Left atrial size was not well visualized. Right Atrium: Right atrial size was not well visualized. Pericardium: The pericardium was not well visualized. Mitral Valve: The mitral valve is abnormal. Moderate mitral annular calcification. No evidence of mitral valve regurgitation. Tricuspid Valve: The tricuspid valve is not well visualized. Tricuspid valve regurgitation is trivial. Aortic Valve: Poor apical windows limit assessment for aortic regurgitation and stenosis. The aortic valve was not well visualized. Pulmonic Valve: The pulmonic valve was not well visualized. Pulmonic valve regurgitation is not visualized. No evidence of pulmonic stenosis. Aorta: The aortic root is normal in size and structure. Pulmonary Artery: The pulmonary artery is not well seen. Venous: The inferior vena cava was not well visualized. IAS/Shunts: The interatrial septum was not well visualized.  LEFT VENTRICLE PLAX 2D LVIDd:         4.60 cm LVIDs:         3.30 cm LV PW:         1.00 cm LV IVS:        0.90 cm LVOT diam:     2.00 cm LVOT  Area:     3.14 cm  LEFT ATRIUM         Index LA diam:    4.20 cm 2.51 cm/m   AORTA Ao Root diam: 3.10 cm TRICUSPID VALVE TR Peak grad:   16.6 mmHg TR Vmax:        204.00 cm/s  SHUNTS Systemic Diam: 2.00 cm Mackenzie Bush MD Electronically signed by Mackenzie Bush MD Signature Date/Time: 03/20/2022/11:33:02 AM    Final    CT Angio Chest PE W and/or Wo Contrast  Result Date: 03/20/2022 CLINICAL DATA:  Pulmonary embolism (PE) suspected, high prob EXAM: CT ANGIOGRAPHY CHEST WITH CONTRAST TECHNIQUE: Multidetector CT imaging of the chest was performed using the standard protocol during bolus administration of intravenous  contrast. Multiplanar CT image reconstructions and MIPs were obtained to evaluate the vascular anatomy. RADIATION DOSE REDUCTION: This exam was performed according to the departmental dose-optimization program which includes automated exposure control, adjustment of the mA and/or kV according to patient size and/or use of iterative reconstruction technique. CONTRAST:  67m OMNIPAQUE IOHEXOL 350 MG/ML SOLN COMPARISON:  01/02/2022 FINDINGS: Cardiovascular: Small filling defect seen within a posterior right lower lobe pulmonary arterial branch on image 79 of series 4 suggesting very small nonocclusive pulmonary embolus. No additional filling defects noted. Heart is normal size. Diffuse coronary artery calcifications. Moderate aortic calcifications. No aneurysm. Mediastinum/Nodes: No mediastinal, hilar, or axillary adenopathy. Trachea and esophagus are unremarkable. Thyroid unremarkable. Lungs/Pleura: Moderate right pleural effusion. Atelectasis in the right middle lobe and right lower lobe. Minimal left base atelectasis with no effusion on the left. Upper Abdomen: Imaging into the upper abdomen demonstrates no acute findings. Musculoskeletal: Chest wall soft tissues are unremarkable. No acute bony abnormality. Review of the MIP images confirms the above findings. IMPRESSION: Very small filling defect  within a left lower lobe pulmonary arterial branch compatible with small nonocclusive pulmonary embolus. Moderate right pleural effusion. Right middle lobe and lower lobe atelectasis. Extensive coronary artery disease. Aortic Atherosclerosis (ICD10-I70.0). Electronically Signed   By: KRolm BaptiseM.D.   On: 03/20/2022 00:02   UKoreaVenous Img Lower Bilateral  Result Date: 03/19/2022 CLINICAL DATA:  Leg swelling EXAM: BILATERAL LOWER EXTREMITY VENOUS DOPPLER ULTRASOUND TECHNIQUE: Gray-scale sonography with compression, as well as color and duplex ultrasound, were performed to evaluate the deep venous system(s) from the level of the common femoral vein through the popliteal and proximal calf veins. COMPARISON:  None Available. FINDINGS: VENOUS Normal compressibility of the common femoral, superficial femoral, and popliteal veins, as well as the visualized calf veins. Visualized portions of profunda femoral vein and great saphenous vein unremarkable. No filling defects to suggest DVT on grayscale or color Doppler imaging. Doppler waveforms show normal direction of venous flow, normal respiratory plasticity and response to augmentation. OTHER None. Limitations: Visualization of the calf veins due to edema. IMPRESSION: No evidence of lower extremity DVT. Electronically Signed   By: KRolm BaptiseM.D.   On: 03/19/2022 23:41   DG Chest 2 View  Result Date: 03/19/2022 CLINICAL DATA:  Increased swelling in legs, shortness of breath, oxygen desaturation EXAM: CHEST - 2 VIEW COMPARISON:  02/16/2016 FINDINGS: Enlargement of cardiac silhouette with pulmonary vascular congestion. Atherosclerotic calcification aorta. RIGHT pleural effusion and basilar atelectasis. Mild interstitial prominence versus previous exam question minimal pulmonary edema. At emphysematous changes and chronic central peribronchial thickening. No pneumothorax or acute osseous findings. IMPRESSION: Enlargement of cardiac silhouette with pulmonary vascular  congestion and question minimal pulmonary edema. COPD changes with RIGHT pleural effusion and basilar atelectasis. Aortic Atherosclerosis (ICD10-I70.0). Emphysema (ICD10-J43.9). Electronically Signed   By: MLavonia DanaM.D.   On: 03/19/2022 18:28    ECHO LVEF 55-60%, interventricular septum flattened in diastole c/w RV overload, akinesis of left ventricular, basilar inferior segment  TELEMETRY reviewed by me: Sinus tachycardia with rates in the low 100s to 110  EKG reviewed by me: sinus tach rate 108 T wave inversions in lead III and aVF and flattening anterolaterally  ASSESSMENT AND PLAN:  CEllieana Doleckiis a 89yoFwith a PMH of COPD, ongoing tobacco use, depression, hyperlipidemia, chronic back pain [facility] and [facility]B. Cardiology is consulted for assistance with new onset heart failure.   #Acute hypoxic respiratory failure #Newly  diagnosed HFpEF (LVEF 55-60%, LV basal inferior wall akinesis) #Hyperlipidemia The patient presents with 1 week of lower extremity swelling up to her knees, orthopnea, and shortness of breath on top of her chronic dyspnea on exertion.  Her BNP is elevated to 800 with a right pleural effusion and vascular congestion on chest x-ray, and clinical volume overload and her lower legs with a new oxygen requirement. incidentally, she has a inferior basilar wall motion abnormality, she has risk factors for CAD including ongoing tobacco use and hyperlipidemia, with an extensive family history of CAD.  She has never had an ischemic work-up in the outpatient setting.  Also incidentally, a very small left lower lobe filling defect was noted on CTA chest and extensive coronary artery calcifications.  -S/p IV Lasix 40 mg x 8 doses with excellent UOP, weight has decreased 6.2 kg since admission -We will discharge home on 40 mg of IV Lasix once daily with a potassium supplement -continue aspirin 81 mg daily -continue rosuvastatin 20 mg  daily -Continue metoprolol XL 12.'5mg'$  for GDMT. -wean oxygen as tolerated, defer management of small PE to primary team -- planning observation  -strict I/O, daily weights -discussed low sodium, heart healthy diet -will need outpatient cardiology follow up at discharge for consideration of ischemic evaluation and recheck of BMP.  She is okay for discharge today from a cardiac standpoint, needs follow-up with Dr. Nehemiah Massed in 1 to 2 weeks.  #COPD #tobacco abuse -Discussed smoking cessation at length with patient, she has tried to quit multiple times in the past (including hypnosis) currently smokes 1 pack/day, doing well with nicotine patches and is interested in continuing them at discharge for cessation   This patient's plan of care was discussed and created with Dr. Nehemiah Massed and he is in agreement.  Signed: Tristan Schroeder , PA-C 03/23/2022, 8:08 AM [facility] Cardiology

## 2022-03-23 NOTE — Telephone Encounter (Signed)
Transition Care Management Unsuccessful Follow-up Telephone Call  Date of discharge and from where:  [facility] 03/23/22  Attempts:  1st Attempt  Reason for unsuccessful TCM follow-up call:  No answer/busy

## 2022-03-23 NOTE — Progress Notes (Signed)
Physical Therapy Treatment Patient Details Name: SHAHIDA SCHNACKENBERG MRN: [id number] DOB: [birth year] Today's Date: 03/23/2022   History of Present Illness Pt is a 76 y.o. female with PMH including asthma, COPD, depression, dyslipidemia, tobacco use, and chronic back pain. Pt presented to ED on 03/19/22 with chief concern of swelling in LEs and SOB. Pt admitted for new onset acute on chronic CHF.   PT Comments    Pt agreeable to PT this date. Pt maintained on 1 lpm O2 via nasal cannula throughout duration of session. Pt SpO2 monitored at 89-90% at rest and 93% during ambulation. Pt Mod I for bed mobility and sit-to-stand transfers utilizing SPC. Pt stated she has had dizziness for a long time now that happens if she is lying flat in bed looking straight up at the ceiling, looking straight down at the floor for a length of time, or if she has her eyes closed. Pt educated on the balance systems and the role of the visual, vestibular, and somatosensory systems. Pt ambulated to hallway for completion of modified DGI without AD scoring 7/15. DGI score indicated deficits with dynamic balance, especially with head turns with pt demonstrating LOB and need to reach for railing on wall to regain balance. Pt then ambulated an additional 200 feet around RN station with [facility] and Supervision. Pt had no LOB, ambulated with slow and steady continuous gait pattern. Pt asked whether or not she would be going home with oxygen. MD and nurse notified. Continue to recommend Outpatient PT at discharge.   Recommendations for follow up therapy are one component of a multi-disciplinary discharge planning process, led by the attending physician.  Recommendations may be updated based on patient status, additional functional criteria and insurance authorization.  Follow Up Recommendations  Outpatient PT     Assistance Recommended at Discharge PRN  Patient can return home with the following Help with stairs or ramp for entrance;A  little help with walking and/or transfers   Equipment Recommendations  None recommended by PT    Recommendations for Other Services       Precautions / Restrictions Precautions Precautions: Fall Restrictions Weight Bearing Restrictions: No     Mobility  Bed Mobility Overal bed mobility: Modified Independent                  Transfers Overall transfer level: Needs assistance Equipment used: Straight cane Transfers: Sit to/from Stand Sit to Stand: Modified independent (Device/Increase time)           General transfer comment: Pt performs sit-to-stands from bed/chair Mod I with SPC    Ambulation/Gait Ambulation/Gait assistance: Supervision Gait Distance (Feet): 280 Feet Assistive device: Straight cane Gait Pattern/deviations: Step-through pattern Gait velocity: decreased     General Gait Details: Step-through continuous gait pattern with slightly decreased speed. Pt reported no dizziness nor SOB with ambulation. SpO2 monitored after 100 feet was 93% on 1 lpm O2 via nasal cannula   Stairs             Wheelchair Mobility    Modified Rankin (Stroke Patients Only)       Balance Overall balance assessment: Needs assistance Sitting-balance support: No upper extremity supported, Feet unsupported Sitting balance-Leahy Scale: Good Sitting balance - Comments: Pt able to sit EOB with feet unsupported, no UE or back support, able to reach within BoS with no LOB.   Standing balance support: No upper extremity supported, Single extremity supported, During functional activity Standing balance-Leahy Scale: Good Standing balance comment: Pt ambulated steadily  with SPC. Able to ambulate with completion of modified DGI without AD.           [place] - Eyes Closed: 30     Standardized Balance Assessment Standardized Balance Assessment : Dynamic Gait Index   Dynamic Gait Index Level Surface: Mild Impairment Change in Gait Speed: Mild Impairment Gait  with Horizontal Head Turns: Severe Impairment Gait with Vertical Head Turns: Moderate Impairment Gait and Pivot Turn: Mild Impairment      Cognition Arousal/Alertness: Awake/alert Behavior During Therapy: WFL for tasks assessed/performed Overall Cognitive Status: Within Functional Limits for tasks assessed                                          Exercises      General Comments General comments (skin integrity, edema, etc.): SpO2 monitored at rest on 1 lpm O2 89-90%. During ambulation after 100 feet 93% on 1 lpm O2      Pertinent Vitals/Pain Pain Assessment Pain Assessment: No/denies pain    Home Living                          Prior Function            PT Goals (current goals can now be found in the care plan section) Acute Rehab PT Goals Patient Stated Goal: to go home PT Goal Formulation: With patient Time For Goal Achievement: 04/04/22 Potential to Achieve Goals: Good Progress towards PT goals: Progressing toward goals    Frequency    Min 2X/week      PT Plan Current plan remains appropriate    Co-evaluation              AM-PAC PT "6 Clicks" Mobility   Outcome Measure  Help needed turning from your back to your side while in a flat bed without using bedrails?: None Help needed moving from lying on your back to sitting on the side of a flat bed without using bedrails?: None Help needed moving to and from a bed to a chair (including a wheelchair)?: A Little Help needed standing up from a chair using your arms (e.g., wheelchair or bedside chair)?: A Little Help needed to walk in hospital room?: A Little Help needed climbing 3-5 steps with a railing? : A Little 6 Click Score: 20    End of Session Equipment Utilized During Treatment: Gait belt;Oxygen Activity Tolerance: Patient tolerated treatment well Patient left: in chair;with call bell/phone within reach;with chair alarm set Nurse Communication: Mobility status PT  Visit Diagnosis: History of falling (Z91.81);Other abnormalities of gait and mobility (R26.89);Pain Pain - Right/Left: Right Pain - part of body: Leg (Sciatica (self-reported))     Time: 2595-6387 PT Time Calculation (min) (ACUTE ONLY): 28 min  Charges:                        Audry Pecina, SPT  Krissie Merrick 03/23/2022, 11:04 AM

## 2022-03-23 NOTE — Progress Notes (Signed)
Patient discharging home. Vital signs stable at time of discharge as reflected in discharge summary. Discharge instructions given and verbal understanding returned. No question at this time. Patient discharging with O2 tank and oxygen compressor.

## 2022-03-26 DIAGNOSIS — R778 Other specified abnormalities of plasma proteins: Secondary | ICD-10-CM | POA: Diagnosis not present

## 2022-03-26 DIAGNOSIS — E7800 Pure hypercholesterolemia, unspecified: Secondary | ICD-10-CM | POA: Diagnosis not present

## 2022-03-26 DIAGNOSIS — I2510 Atherosclerotic heart disease of native coronary artery without angina pectoris: Secondary | ICD-10-CM | POA: Diagnosis not present

## 2022-03-26 DIAGNOSIS — I5033 Acute on chronic diastolic (congestive) heart failure: Secondary | ICD-10-CM | POA: Diagnosis not present

## 2022-03-27 ENCOUNTER — Encounter: Payer: Self-pay | Admitting: Family Medicine

## 2022-03-27 ENCOUNTER — Ambulatory Visit: Payer: Medicare PPO | Admitting: Family Medicine

## 2022-03-27 VITALS — BP 113/62 | HR 99 | Ht <= 58 in | Wt 147.8 lb

## 2022-03-27 DIAGNOSIS — I5032 Chronic diastolic (congestive) heart failure: Secondary | ICD-10-CM

## 2022-03-27 DIAGNOSIS — R600 Localized edema: Secondary | ICD-10-CM | POA: Diagnosis not present

## 2022-03-27 NOTE — Patient Instructions (Addendum)
Thank you for coming to the office today.  Keep on Jardiance '10mg'$  daily  Keep on the fluid treatment Lasix '40mg'$  daily and Potassium 39mq daily as well.  Future ask Cardiology about lowering the Lasix fluid medication dose if need.   Please schedule a Follow-up Appointment to: Return if symptoms worsen or fail to improve.  If you have any other questions or concerns, please feel free to call the office or send a message through [facility] You may also schedule an earlier appointment if necessary.  Additionally, you may be receiving a survey about your experience at our office within a few days to 1 week by e-mail or mail. We value your feedback.  ANobie Putnam DO [facility]

## 2022-03-27 NOTE — Progress Notes (Signed)
Subjective:    Patient ID: Mackenzie Ward, female    DOB: 10-27-1945, 76 y.o.   MRN: [id number]  Mackenzie Ward is a 76 y.o. female presenting on 03/27/2022 for Hospitalization Follow-up   HPI  HOSPITAL FOLLOW-UP VISIT  Hospital/Location: [place] Date of Admission: 03/19/22 Date of Discharge: 03/23/22 Transitions of care telephone call: Attempted 03/23/22 1 x, by Kirke Shaggy LPN patient did not answer  Reason for Admission: Edema / Dyspnea  - Hospital H&P and Discharge Summary have been reviewed - Patient presents today 4 days after recent hospitalization. Brief summary of recent course, patient had symptoms of lower extremity edema and dyspnea, hospitalized, diagnosed with acute respiratory failure with hypoxemia and acute on chronic diastolic CHF, treated with 1L oxygen and diuretic therapy, discharged on Lasix '40mg'$  daily and added Potassium 28mq daily  [facility], she was started on Jardiance '10mg'$  daily for preventing CHF exacerbation.  - Today reports overall has done well after discharge. Symptoms of CHF and edema have resolved. No further breathing concerns. She has not used the supplemental oxygen at home, no longer needs it  - New medications on discharge: Furosemide and Potassium - Changes to current meds on discharge: None  I have reviewed the discharge medication list, and have reconciled the current and discharge medications today.   Current Outpatient Medications:    acetaminophen (TYLENOL) 500 MG tablet, Take 1,000 mg by mouth every 8 (eight) hours as needed., Disp: , Rfl:    albuterol (PROVENTIL) (2.5 MG/3ML) 0.083% nebulizer solution, Take 3 mLs (2.5 mg total) by nebulization every 6 (six) hours as needed for wheezing or shortness of breath., Disp: 60 mL, Rfl: 1   ARIPiprazole (ABILIFY) 5 MG tablet, Take 1 tablet (5 mg total) by mouth daily., Disp: 30 tablet, Rfl: 1   empagliflozin (JARDIANCE) 10 MG TABS tablet,  Take 1 tablet by mouth daily., Disp: , Rfl:    furosemide (LASIX) 40 MG tablet, Take 1 tablet (40 mg total) by mouth daily., Disp: 30 tablet, Rfl: 0   gabapentin (NEURONTIN) 300 MG capsule, TAKE 1 CAPSULE BY MOUTH TWICE DAILY (Patient taking differently: Take 300 mg by mouth 3 (three) times daily.), Disp: 180 capsule, Rfl: 1   hydrOXYzine (ATARAX/VISTARIL) 10 MG tablet, Take 1 tablet (10 mg total) by mouth 3 (three) times daily as needed for anxiety., Disp: 270 tablet, Rfl: 3   Magnesium Hydroxide (MAGNESIA PO), Take 3 capsules by mouth daily as needed., Disp: , Rfl:    metoprolol succinate (TOPROL-XL) 25 MG 24 hr tablet, Take 0.5 tablets (12.5 mg total) by mouth daily., Disp: 60 tablet, Rfl: 0   potassium chloride SA (KLOR-CON M) 20 MEQ tablet, Take 1 tablet (20 mEq total) by mouth daily., Disp: 30 tablet, Rfl: 0   rosuvastatin (CRESTOR) 5 MG tablet, Take 1 tablet (5 mg total) by mouth at bedtime., Disp: 90 tablet, Rfl: 3   SPIRIVA HANDIHALER 18 MCG inhalation capsule, Place 1 capsule (18 mcg total) into inhaler and inhale daily. (Patient taking differently: Place 18 mcg into inhaler and inhale daily as needed. Place 1 capsule (18 mcg total) into inhaler and inhale daily.), Disp: 90 capsule, Rfl: 1   venlafaxine XR (EFFEXOR-XR) 150 MG 24 hr capsule, Take total of 225 mg daily, along with 75 mg cap, Disp: 90 capsule, Rfl: 3  ------------------------------------------------------------------------- Social History   Tobacco Use   Smoking status: Every Day    Packs/day: 1.00    Years: 45.00  Total pack years: 45.00    Types: Cigarettes   Smokeless tobacco: Never  Vaping Use   Vaping Use: Never used  Substance Use Topics   Alcohol use: No    Alcohol/week: 0.0 standard drinks of alcohol   Drug use: No    Review of Systems Per HPI unless specifically indicated above     Objective:    BP 113/62   Pulse 99   Ht '4\' 10"'$  (1.473 m)   Wt 147 lb 12.8 oz (67 kg)   SpO2 95%   BMI 30.89  kg/m   Wt Readings from Last 3 Encounters:  03/27/22 147 lb 12.8 oz (67 kg)  03/23/22 150 lb 5.7 oz (68.2 kg)  01/16/22 150 lb 6.4 oz (68.2 kg)    Physical Exam Vitals and nursing note reviewed.  Constitutional:      General: She is not in acute distress.    Appearance: She is well-developed. She is not diaphoretic.     Comments: Well-appearing, comfortable, cooperative  HENT:     Head: Normocephalic and atraumatic.  Eyes:     General:        Right eye: No discharge.        Left eye: No discharge.     Conjunctiva/sclera: Conjunctivae normal.  Neck:     Thyroid: No thyromegaly.  Cardiovascular:     Rate and Rhythm: Normal rate and regular rhythm.     Heart sounds: Normal heart sounds. No murmur heard. Pulmonary:     Effort: Pulmonary effort is normal. No respiratory distress.     Breath sounds: Normal breath sounds. No wheezing or rales.  Musculoskeletal:        General: Normal range of motion.     Cervical back: Normal range of motion and neck supple.     Right lower leg: No edema.     Left lower leg: No edema.  Lymphadenopathy:     Cervical: No cervical adenopathy.  Skin:    General: Skin is warm and dry.     Findings: No erythema or rash.  Neurological:     Mental Status: She is alert and oriented to person, place, and time.  Psychiatric:        Behavior: Behavior normal.     Comments: Well groomed, good eye contact, normal speech and thoughts       Results for orders placed or performed during the hospital encounter of 03/19/22  SARS Coronavirus 2 by RT PCR (hospital order, performed in [facility] hospital lab) *cepheid single result test* Anterior Nasal Swab   Specimen: Anterior Nasal Swab  Result Value Ref Range   SARS Coronavirus 2 by RT PCR NEGATIVE NEGATIVE  Basic metabolic panel  Result Value Ref Range   Sodium 138 135 - 145 mmol/L   Potassium 4.6 3.5 - 5.1 mmol/L   Chloride 100 98 - 111 mmol/L   CO2 27 22 - 32 mmol/L   Glucose, Bld 81 70 - 99  mg/dL   BUN 9 8 - 23 mg/dL   Creatinine, Ser 0.73 0.44 - 1.00 mg/dL   Calcium 8.3 (L) 8.9 - 10.3 mg/dL   GFR, Estimated >60 >60 mL/min   Anion gap 11 5 - 15  Brain natriuretic peptide  Result Value Ref Range   B Natriuretic Peptide 855.4 (H) 0.0 - 100.0 pg/mL  CBC with Differential  Result Value Ref Range   WBC 7.8 4.0 - 10.5 K/uL   RBC 4.54 3.87 - 5.11 MIL/uL   Hemoglobin  12.3 12.0 - 15.0 g/dL   HCT 39.9 36.0 - 46.0 %   MCV 87.9 80.0 - 100.0 fL   MCH 27.1 26.0 - 34.0 pg   MCHC 30.8 30.0 - 36.0 g/dL   RDW 15.9 (H) 11.5 - 15.5 %   Platelets 443 (H) 150 - 400 K/uL   nRBC 0.0 0.0 - 0.2 %   Neutrophils Relative % 63 %   Neutro Abs 5.0 1.7 - 7.7 K/uL   Lymphocytes Relative 25 %   Lymphs Abs 1.9 0.7 - 4.0 K/uL   Monocytes Relative 10 %   Monocytes Absolute 0.8 0.1 - 1.0 K/uL   Eosinophils Relative 1 %   Eosinophils Absolute 0.1 0.0 - 0.5 K/uL   Basophils Relative 0 %   Basophils Absolute 0.0 0.0 - 0.1 K/uL   Immature Granulocytes 1 %   Abs Immature Granulocytes 0.04 0.00 - 0.07 K/uL  Hepatic function panel  Result Value Ref Range   Total Protein 6.4 (L) 6.5 - 8.1 g/dL   Albumin 2.6 (L) 3.5 - 5.0 g/dL   AST 28 15 - 41 U/L   ALT 26 0 - 44 U/L   Alkaline Phosphatase 140 (H) 38 - 126 U/L   Total Bilirubin 0.5 0.3 - 1.2 mg/dL   Bilirubin, Direct 0.1 0.0 - 0.2 mg/dL   Indirect Bilirubin 0.4 0.3 - 0.9 mg/dL  Lipase, blood  Result Value Ref Range   Lipase 32 11 - 51 U/L  Basic metabolic panel  Result Value Ref Range   Sodium 141 135 - 145 mmol/L   Potassium 4.1 3.5 - 5.1 mmol/L   Chloride 102 98 - 111 mmol/L   CO2 30 22 - 32 mmol/L   Glucose, Bld 86 70 - 99 mg/dL   BUN 8 8 - 23 mg/dL   Creatinine, Ser 0.64 0.44 - 1.00 mg/dL   Calcium 8.1 (L) 8.9 - 10.3 mg/dL   GFR, Estimated >60 >60 mL/min   Anion gap 9 5 - 15  CBC  Result Value Ref Range   WBC 8.2 4.0 - 10.5 K/uL   RBC 4.62 3.87 - 5.11 MIL/uL   Hemoglobin 12.3 12.0 - 15.0 g/dL   HCT 40.9 36.0 - 46.0 %   MCV 88.5  80.0 - 100.0 fL   MCH 26.6 26.0 - 34.0 pg   MCHC 30.1 30.0 - 36.0 g/dL   RDW 15.9 (H) 11.5 - 15.5 %   Platelets 414 (H) 150 - 400 K/uL   nRBC 0.0 0.0 - 0.2 %  Basic metabolic panel  Result Value Ref Range   Sodium 138 135 - 145 mmol/L   Potassium 3.2 (L) 3.5 - 5.1 mmol/L   Chloride 96 (L) 98 - 111 mmol/L   CO2 37 (H) 22 - 32 mmol/L   Glucose, Bld 90 70 - 99 mg/dL   BUN 10 8 - 23 mg/dL   Creatinine, Ser 0.76 0.44 - 1.00 mg/dL   Calcium 8.2 (L) 8.9 - 10.3 mg/dL   GFR, Estimated >60 >60 mL/min   Anion gap 5 5 - 15  Magnesium  Result Value Ref Range   Magnesium 2.0 1.7 - 2.4 mg/dL  Basic metabolic panel  Result Value Ref Range   Sodium 138 135 - 145 mmol/L   Potassium 4.6 3.5 - 5.1 mmol/L   Chloride 98 98 - 111 mmol/L   CO2 33 (H) 22 - 32 mmol/L   Glucose, Bld 98 70 - 99 mg/dL   BUN 13 8 - 23  mg/dL   Creatinine, Ser 0.80 0.44 - 1.00 mg/dL   Calcium 8.4 (L) 8.9 - 10.3 mg/dL   GFR, Estimated >60 >60 mL/min   Anion gap 7 5 - 15  Magnesium  Result Value Ref Range   Magnesium 2.1 1.7 - 2.4 mg/dL  ECHOCARDIOGRAM COMPLETE  Result Value Ref Range   Weight 2,624 oz   Height 58 in   BP 103/68 mmHg   S' Lateral 3.30 cm  Troponin I (High Sensitivity)  Result Value Ref Range   Troponin I (High Sensitivity) 66 (H) <18 ng/L  Troponin I (High Sensitivity)  Result Value Ref Range   Troponin I (High Sensitivity) 59 (H) <18 ng/L      Assessment & Plan:   Problem List Items Addressed This Visit   None Visit Diagnoses     Bilateral lower extremity edema    -  Primary   Relevant Orders   BASIC METABOLIC PANEL WITH GFR   Chronic diastolic congestive heart failure (HCC)       Relevant Orders   BASIC METABOLIC PANEL WITH GFR       Acute on chronic Diastolic CHF HFpEF - Improved No further requirement of supplemental oxygen, 95% on room air Improved resp status and fluid balance. Already seen Cardiology yesterday  Chemistry lab today for Cr / K  Follow w/ Cardiology  Keep  on Jardiance '10mg'$  daily  Keep on the fluid treatment Lasix '40mg'$  daily and Potassium 62mq daily as well.  Future ask Cardiology about lowering the Lasix fluid medication dose if need.   No orders of the defined types were placed in this encounter.   Follow up plan: Return if symptoms worsen or fail to improve.  ANobie Putnam [facility] 03/27/2022, 11:25 AM

## 2022-03-28 LAB — BASIC METABOLIC PANEL WITH GFR
BUN: 8 mg/dL (ref 7–25)
CO2: 34 mmol/L — ABNORMAL HIGH (ref 20–32)
Calcium: 8.8 mg/dL (ref 8.6–10.4)
Chloride: 99 mmol/L (ref 98–110)
Creat: 0.81 mg/dL (ref 0.60–1.00)
Glucose, Bld: 54 mg/dL — ABNORMAL LOW (ref 65–139)
Potassium: 4.4 mmol/L (ref 3.5–5.3)
Sodium: 141 mmol/L (ref 135–146)
eGFR: 76 mL/min/{1.73_m2} (ref >=60)

## 2022-04-03 NOTE — Progress Notes (Signed)
Virtual Visit via Video Note  I connected with Mackenzie Ward on 04/06/22 at 11:00 AM EDT by a video enabled telemedicine application and verified that I am speaking with the correct person using two identifiers.  Location: Patient: home Provider: office Persons participated in the visit- patient, provider    I discussed the limitations of evaluation and management by telemedicine and the availability of in person appointments. The patient expressed understanding and agreed to proceed.     I discussed the assessment and treatment plan with the patient. The patient was provided an opportunity to ask questions and all were answered. The patient agreed with the plan and demonstrated an understanding of the instructions.   The patient was advised to call back or seek an in-person evaluation if the symptoms worsen or if the condition fails to improve as anticipated.  I provided 12 minutes of non-face-to-face time during this encounter.   Norman Clay, MD   [facility] MD/PA/NP OP Progress Note  04/06/2022 11:30 AM Mackenzie Ward  MRN:  [id number]  Chief Complaint:  Chief Complaint  Patient presents with   Follow-up   Depression   HPI:  - She was admitted due to acute respiratory failure in the context of CHF since the last visit.    This is a follow-up appointment for depression.  She states that she has been doing better except that she was admitted.  She did not have much shortness of breath or chest pain, and has been doing good since discharge.  Her sister is helping her to go out more frequent.  Although she is unable to take a walk as much due to plantar fasciitis and sciatica, she has been working on exercise with exercise bands.  She is hoping to start chair yoga with her sister.  She sleeps well.  She denies feeling depressed or anxiety.  She has a good appetite since the treatment of CHF.  She denies SI.  She denies panic attacks.  Although she has been taking hydroxyzine  regularly, she is willing to use it only as needed for extreme anxiety.  She feels comfortable to stay on the current medication regimen.    Daily routine: household chores,  Exercise: Employment: Retired in 2010. 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 Married twice before Number of children: 0 She grew up in [place]. She had "good childhood." She lost her mother in 10-25-20. Her father deceased in 54.  Wt Readings from Last 3 Encounters:  03/27/22 147 lb 12.8 oz (67 kg)  03/24/22 150 lb 5.7 oz (68.2 kg)  01/16/22 150 lb 6.4 oz (68.2 kg)     Visit Diagnosis:    ICD-10-CM   1. Anxiety state  F41.1 gabapentin (NEURONTIN) 300 MG capsule    2. MDD (major depressive disorder), recurrent, in partial remission (HCC)  F33.41 ARIPiprazole (ABILIFY) 5 MG tablet      Past Psychiatric History: Please see initial evaluation for full details. I have reviewed the history. No updates at this time.     Past Medical History:  Past Medical History:  Diagnosis Date   Asthma    COPD (chronic obstructive pulmonary disease) (HCC)    Depression    High cholesterol     Past Surgical  History:  Procedure Laterality Date   BREAST SURGERY Right 2013   CATARACT EXTRACTION W/PHACO Left 08/26/2019   Procedure: CATARACT EXTRACTION PHACO AND INTRAOCULAR LENS PLACEMENT (IOC) LEFT  4.45    00:40.5    11.0%;  Surgeon: Leandrew Koyanagi, MD;  Location: [place];  Service: Ophthalmology;  Laterality: Left;   CATARACT EXTRACTION W/PHACO Right 09/30/2019   Procedure: CATARACT EXTRACTION PHACO AND INTRAOCULAR LENS PLACEMENT (IOC) RIGHT 4.05  00:44.4  9.1% ;  Surgeon: Leandrew Koyanagi, MD;  Location: [place];  Service: Ophthalmology;   Laterality: Right;   COLONOSCOPY  15 years ago   COLONOSCOPY WITH PROPOFOL N/A 11/10/2018   Procedure: COLONOSCOPY WITH PROPOFOL;  Surgeon: Lin Landsman, MD;  Location: [facility] ENDOSCOPY;  Service: Gastroenterology;  Laterality: N/A;   EYE SURGERY     TONSILLECTOMY      Family Psychiatric History: Please see initial evaluation for full details. I have reviewed the history. No updates at this time.     Family History:  Family History  Problem Relation Age of Onset   Lymphoma Mother        Non Hodgkin   Dementia Mother    Alzheimer's disease Mother    Lymphoma Sister        Non Hodgkin   Depression Sister    Heart attack Father    Alcohol abuse Father    Dementia Brother    Dementia Maternal Uncle    Alzheimer's disease Maternal Aunt    Alzheimer's disease Paternal Aunt    Alzheimer's disease Paternal Grandmother    Alzheimer's disease Maternal Aunt    Alzheimer's disease Paternal Aunt     Social History:  Social History   Socioeconomic History   Marital status: Widowed    Spouse name: Not on file   Number of children: Not on file   Years of education: Not on file   Highest education level: Some college, no degree  Occupational History   Occupation: retired  Tobacco Use   Smoking status: Every Day    Packs/day: 1.00    Years: 45.00    Total pack years: 45.00    Types: Cigarettes   Smokeless tobacco: Never  Vaping Use   Vaping Use: Never used  Substance and Sexual Activity   Alcohol use: No    Alcohol/week: 0.0 standard drinks of alcohol   Drug use: No   Sexual activity: Not Currently  Other Topics Concern   Not on file  Social History Narrative   Goes to dinner with friends often, goes to see her mom at nursing home on a regular basis .         Pt plans on taking a trip to [place] 05/2022.   Social Determinants of Health   Financial Resource Strain: Low Risk  (11/16/2021)   Overall Financial Resource Strain (CARDIA)    Difficulty of Paying Living  Expenses: Not hard at all  Food Insecurity: No Food Insecurity (11/16/2021)   Hunger Vital Sign    Worried About Running Out of Food in the Last Year: Never true    Ran Out of Food in the Last Year: Never true  Transportation Needs: No Transportation Needs (11/16/2021)   PRAPARE - Hydrologist (Medical): No    Lack of Transportation (Non-Medical): No  Physical Activity: Inactive (11/16/2021)   Exercise Vital Sign    Days of Exercise per Week: 0 days    Minutes of Exercise per Session: 0 min  Stress: Stress Concern Present (11/16/2021)   [place]    Feeling of Stress : To some extent  Social Connections: Socially Isolated (11/16/2021)   Social Connection and Isolation Panel [NHANES]    Frequency of Communication with Friends and Family: Twice a week    Frequency of Social Gatherings with Friends and Family: Never    Attends Religious Services: Never    Marine scientist or Organizations: No    Attends Archivist Meetings: Never    Marital Status: Widowed    Allergies: No Known Allergies  Metabolic Disorder Labs: Lab Results  Component Value Date   HGBA1C 5.6 01/08/2022   MPG 114 01/08/2022   MPG 105 01/09/2021   No results found for: "PROLACTIN" Lab Results  Component Value Date   CHOL 267 (H) 01/08/2022   TRIG 167 (H) 01/08/2022   HDL 62 01/08/2022   CHOLHDL 4.3 01/08/2022   VLDL 27 11/05/2016   LDLCALC 173 (H) 01/08/2022   LDLCALC 159 (H) 01/09/2021   Lab Results  Component Value Date   TSH 2.30 01/08/2022   TSH 3.98 01/09/2021    Therapeutic Level Labs: Lab Results  Component Value Date   LITHIUM 0.7 04/13/2021   No results found for: "VALPROATE" No results found for: "CBMZ"  Current Medications: Current Outpatient Medications  Medication Sig Dispense Refill   acetaminophen (TYLENOL) 500 MG tablet Take 1,000 mg by mouth every 8 (eight) hours as needed.      albuterol (PROVENTIL) (2.5 MG/3ML) 0.083% nebulizer solution Take 3 mLs (2.5 mg total) by nebulization every 6 (six) hours as needed for wheezing or shortness of breath. 60 mL 1   [START ON 04/30/2022] ARIPiprazole (ABILIFY) 5 MG tablet Take 1 tablet (5 mg total) by mouth daily. 90 tablet 0   empagliflozin (JARDIANCE) 10 MG TABS tablet Take 1 tablet by mouth daily.     furosemide (LASIX) 40 MG tablet Take 1 tablet (40 mg total) by mouth daily. 30 tablet 0   gabapentin (NEURONTIN) 300 MG capsule Take 1 capsule (300 mg total) by mouth 3 (three) times daily. 270 capsule 0   hydrOXYzine (ATARAX/VISTARIL) 10 MG tablet Take 1 tablet (10 mg total) by mouth 3 (three) times daily as needed for anxiety. 270 tablet 3   Magnesium Hydroxide (MAGNESIA PO) Take 3 capsules by mouth daily as needed.     metoprolol succinate (TOPROL-XL) 25 MG 24 hr tablet Take 0.5 tablets (12.5 mg total) by mouth daily. 60 tablet 0   potassium chloride SA (KLOR-CON M) 20 MEQ tablet Take 1 tablet (20 mEq total) by mouth daily. 30 tablet 0   rosuvastatin (CRESTOR) 5 MG tablet Take 1 tablet (5 mg total) by mouth at bedtime. 90 tablet 3   SPIRIVA HANDIHALER 18 MCG inhalation capsule Place 1 capsule (18 mcg total) into inhaler and inhale daily. (Patient taking differently: Place 18 mcg into inhaler and inhale daily as needed. Place 1 capsule (18 mcg total) into inhaler and inhale daily.) 90 capsule 1   venlafaxine XR (EFFEXOR-XR) 150 MG 24 hr capsule Take total of 225 mg daily, along with 75 mg cap 90 capsule 3   No current facility-administered medications for this visit.     Musculoskeletal: Strength & Muscle Tone:  N/A Gait & Station:  N/A Patient leans: N/A  Psychiatric Specialty Exam: Review of Systems  Psychiatric/Behavioral:  Negative for agitation, behavioral problems, confusion, decreased concentration, dysphoric mood, hallucinations, self-injury, sleep disturbance and  suicidal ideas. The patient is not nervous/anxious  and is not hyperactive.   All other systems reviewed and are negative.   There were no vitals taken for this visit.There is no height or weight on file to calculate BMI.  General Appearance: Fairly Groomed  Eye Contact:  Good  Speech:  Clear and Coherent  Volume:  Normal  Mood:   good  Affect:  Appropriate, Congruent, and Full Range  Thought Process:  Coherent  Orientation:  Full (Time, Place, and Person)  Thought Content: Logical   Suicidal Thoughts:  No  Homicidal Thoughts:  No  Memory:  Immediate;   Good  Judgement:  Good  Insight:  Good  Psychomotor Activity:  Normal  Concentration:  Concentration: Good and Attention Span: Good  Recall:  Good  Fund of Knowledge: Good  Language: Good  Akathisia:  No  Handed:  Right  AIMS (if indicated): not done  Assets:  Communication Skills Desire for Improvement  ADL's:  Intact  Cognition: WNL  Sleep:  Good   Screenings: GAD-7    Flowsheet Row Office Visit from 03/27/2022 in [facility] Office Visit from 01/16/2022 in [facility] Office Visit from 01/07/2020 in [facility]  Total GAD-7 Score '4 5 3      '$ PHQ2-9    [place] Visit from 03/27/2022 in [facility] Office Visit from 01/16/2022 in [facility] Office Visit from 10/26/2021 in [place] Video Visit from 06/23/2021 in [place] Office Visit from 04/24/2021 in [place]  PHQ-2 Total Score 0 '2 3 1 1  '$ PHQ-9 Total Score '6 7 15 '$ -- --      Flowsheet Row ED to Hosp-Admission (Discharged) from 03/19/2022 in [facility] Office Visit from 10/26/2021 in [place] Video Visit from 06/23/2021 in [place] No Risk No Risk No Risk        Assessment and Plan:  Mackenzie Ward is a 75 y.o. year old female  with a history of depression, COPD, hyperlipidemia, chronic low back pain, who presents for follow up appointment for below.   1. Anxiety state 2. MDD (major depressive disorder), recurrent, in partial remission ([place]) There has been significant improvement in depressive symptoms and then anxiety since uptitration of Abilify. Psychosocial stressors includes loss of her husband in July, chronic pain.  Will continue current dose of venlafaxine and Abilify to target depression.  Will continue gabapentin for anxiety and neuropathic pain.  Will continue hydroxyzine as needed for anxiety.  She is willing to hold this medication use to avoid side effect when able.    # Cognitive deficit Unchanged. She reports occasional word-finding difficulty.  IADL is independent.  Although she was advised to obtain labs, she was not interested in this .  Will continue to monitor.    Plan   Continue venlafaxine 225 mg daily Continue Abilify 5 mg at night  Continue gabapentin 300 mg three times a day  Continue hydroxyzine 10 mg three times a day as needed for anxiety - she declined a refill Next appointment: 9/29 at 11:30 for 30 mins, video [email or web address]. [phone] (sister)     Past trials of medication- quetiapine    The patient demonstrates the following risk factors for suicide: Chronic risk factors for suicide include: psychiatric disorder of bipolar disorder and chronic pain. Acute risk factors for suicide include: unemployment.  Protective factors for this patient include: positive social support, responsibility to others (children, family) and hope for the future. Considering these factors, the overall suicide risk at this point appears to be low. Patient is appropriate for outpatient follow up.            Collaboration of Care: Collaboration of Care: Other N/A  Patient/Guardian was advised Release of Information must be obtained prior to any record release in order to collaborate their care  with an outside provider. Patient/Guardian was advised if they have not already done so to contact the registration department to sign all necessary forms in order for [place] to release information regarding their care.   Consent: Patient/Guardian gives verbal consent for treatment and assignment of benefits for services provided during this visit. Patient/Guardian expressed understanding and agreed to proceed.    Norman Clay, MD 04/06/2022, 11:30 AM

## 2022-04-06 ENCOUNTER — Telehealth (INDEPENDENT_AMBULATORY_CARE_PROVIDER_SITE_OTHER): Payer: Medicare PPO | Admitting: Psychiatry

## 2022-04-06 ENCOUNTER — Encounter: Payer: Self-pay | Admitting: Psychiatry

## 2022-04-06 DIAGNOSIS — F411 Generalized anxiety disorder: Secondary | ICD-10-CM | POA: Diagnosis not present

## 2022-04-06 DIAGNOSIS — F3341 Major depressive disorder, recurrent, in partial remission: Secondary | ICD-10-CM | POA: Diagnosis not present

## 2022-04-06 DIAGNOSIS — I5030 Unspecified diastolic (congestive) heart failure: Secondary | ICD-10-CM | POA: Diagnosis not present

## 2022-04-06 MED ORDER — GABAPENTIN 300 MG PO CAPS: 300.0000 mg | ORAL_CAPSULE | Freq: Three times a day (TID) | ORAL | 0 refills | Status: AC

## 2022-04-06 MED ORDER — ARIPIPRAZOLE 5 MG PO TABS: 5.0000 mg | ORAL_TABLET | Freq: Every day | ORAL | 0 refills | Status: AC

## 2022-04-06 NOTE — Patient Instructions (Signed)
Continue venlafaxine 225 mg daily Continue Abilify 5 mg at night  Continue gabapentin 300 mg three times a day  Continue hydroxyzine 10 mg three times a day as needed for anxiety  Next appointment: 9/29 at 11:30

## 2022-04-09 ENCOUNTER — Ambulatory Visit: Payer: Medicare PPO | Attending: Family | Admitting: Family

## 2022-04-09 ENCOUNTER — Encounter: Payer: Self-pay | Admitting: Family

## 2022-04-09 VITALS — BP 114/66 | HR 99 | Resp 18 | Ht <= 58 in | Wt 142.1 lb

## 2022-04-09 DIAGNOSIS — R42 Dizziness and giddiness: Secondary | ICD-10-CM | POA: Insufficient documentation

## 2022-04-09 DIAGNOSIS — I5032 Chronic diastolic (congestive) heart failure: Secondary | ICD-10-CM | POA: Diagnosis not present

## 2022-04-09 DIAGNOSIS — F17210 Nicotine dependence, cigarettes, uncomplicated: Secondary | ICD-10-CM | POA: Diagnosis not present

## 2022-04-09 DIAGNOSIS — E785 Hyperlipidemia, unspecified: Secondary | ICD-10-CM | POA: Insufficient documentation

## 2022-04-09 DIAGNOSIS — J449 Chronic obstructive pulmonary disease, unspecified: Secondary | ICD-10-CM | POA: Diagnosis not present

## 2022-04-09 DIAGNOSIS — F419 Anxiety disorder, unspecified: Secondary | ICD-10-CM | POA: Diagnosis not present

## 2022-04-09 DIAGNOSIS — Z720 Tobacco use: Secondary | ICD-10-CM | POA: Diagnosis not present

## 2022-04-09 DIAGNOSIS — F319 Bipolar disorder, unspecified: Secondary | ICD-10-CM | POA: Insufficient documentation

## 2022-04-09 DIAGNOSIS — Z86718 Personal history of other venous thrombosis and embolism: Secondary | ICD-10-CM | POA: Diagnosis not present

## 2022-04-09 NOTE — Patient Instructions (Signed)
Continue weighing daily and call for an overnight weight gain of 3 pounds or more or a weekly weight gain of more than 5 pounds.   If you have voicemail, please make sure your mailbox is cleaned out so that we may leave a message and please make sure to listen to any voicemails.     

## 2022-04-09 NOTE — Progress Notes (Signed)
Patient ID: Mackenzie Ward, female    DOB: [birth year], 76 y.o.   MRN: [id number]  HPI  Mackenzie Ward is a 76 y/o female with a history of asthma, anxiety, hyperlipidemia, COPD, depression, bipolar, depression, PE, thrombosis, tobacco use and chronic heart failure.   Echo report from 03/20/22 reviewed and showed an EF of 55-60%.   Admitted 03/19/22 due to worsening dyspnea and orthopnea. Placed on 2L. Initially given IV lasix with transition to oral diuretics. Cardiology consult obtained. Elevated troponin due to HF. Hypokalemia resolved. PT evaluation done. Discharged after 4 days.   Mackenzie Ward presents today for her initial visit with a chief complaint of moderate fatigue upon minimal exertion. Describes this as chronic in nature. Mackenzie Ward has associated cough, shortness of breath and intermittent light-headedness along with this. Mackenzie Ward denies any difficulty sleeping, abdominal distention, palpitations, pedal edema, chest pain, wheezing or weight gain.   Participating in [place] HF monitoring program.   Past Medical History:  Diagnosis Date   Anxiety    Asthma    Bipolar 1 disorder (HCC)    CHF (congestive heart failure) (HCC)    COPD (chronic obstructive pulmonary disease) (HCC)    Depression    Depression    High cholesterol    History of pulmonary embolus (PE)    Thrombosis    Past Surgical History:  Procedure Laterality Date   BREAST SURGERY Right 2013   CATARACT EXTRACTION W/PHACO Left 08/26/2019   Procedure: CATARACT EXTRACTION PHACO AND INTRAOCULAR LENS PLACEMENT (IOC) LEFT  4.45    00:40.5    11.0%;  Surgeon: Leandrew Koyanagi, MD;  Location: [place];  Service: Ophthalmology;  Laterality: Left;   CATARACT EXTRACTION W/PHACO Right 09/30/2019   Procedure: CATARACT EXTRACTION PHACO AND INTRAOCULAR LENS PLACEMENT (IOC) RIGHT 4.05  00:44.4  9.1% ;  Surgeon: Leandrew Koyanagi, MD;  Location: [place];  Service: Ophthalmology;  Laterality: Right;    COLONOSCOPY  15 years ago   COLONOSCOPY WITH PROPOFOL N/A 11/10/2018   Procedure: COLONOSCOPY WITH PROPOFOL;  Surgeon: Lin Landsman, MD;  Location: [facility] ENDOSCOPY;  Service: Gastroenterology;  Laterality: N/A;   EYE SURGERY     TONSILLECTOMY     Family History  Problem Relation Age of Onset   Lymphoma Mother        Non Hodgkin   Dementia Mother    Alzheimer's disease Mother    Lymphoma Sister        Non Hodgkin   Depression Sister    Heart attack Father    Alcohol abuse Father    Dementia Brother    Dementia Maternal Uncle    Alzheimer's disease Maternal Aunt    Alzheimer's disease Paternal Aunt    Alzheimer's disease Paternal Grandmother    Alzheimer's disease Maternal Aunt    Alzheimer's disease Paternal Aunt    Social History   Tobacco Use   Smoking status: Every Day    Packs/day: 1.00    Years: 45.00    Total pack years: 45.00    Types: Cigarettes   Smokeless tobacco: Never  Substance Use Topics   Alcohol use: No    Alcohol/week: 0.0 standard drinks of alcohol   No Known Allergies Prior to Admission medications   Medication Sig Start Date End Date Taking? Authorizing Provider  acetaminophen (TYLENOL) 500 MG tablet Take 1,000 mg by mouth every 8 (eight) hours as needed.   Yes [provider]  albuterol (PROVENTIL) (2.5 MG/3ML) 0.083% nebulizer solution Take 3 mLs (2.5  mg total) by nebulization every 6 (six) hours as needed for wheezing or shortness of breath. 05/27/18  Yes Mikey College, NP  ARIPiprazole (ABILIFY) 5 MG tablet Take 1 tablet (5 mg total) by mouth daily. 04/30/22 07/29/22 Yes Hisada, Elie Goody, MD  empagliflozin (JARDIANCE) 10 MG TABS tablet Take 1 tablet by mouth daily. 03/26/22 03/26/23 Yes [provider]  furosemide (LASIX) 40 MG tablet Take 1 tablet (40 mg total) by mouth daily. 03/24/22  Yes Sharen Hones, MD  gabapentin (NEURONTIN) 300 MG capsule Take 1 capsule (300 mg total) by mouth 3 (three) times daily. 04/06/22 07/05/22  Yes Norman Clay, MD  hydrOXYzine (ATARAX/VISTARIL) 10 MG tablet Take 1 tablet (10 mg total) by mouth 3 (three) times daily as needed for anxiety. 07/19/21  Yes Karamalegos, Devonne Doughty, DO  Magnesium Hydroxide (MAGNESIA PO) Take 3 capsules by mouth daily as needed.   Yes [provider]  metoprolol succinate (TOPROL-XL) 25 MG 24 hr tablet Take 0.5 tablets (12.5 mg total) by mouth daily. 03/24/22  Yes Sharen Hones, MD  potassium chloride SA (KLOR-CON M) 20 MEQ tablet Take 1 tablet (20 mEq total) by mouth daily. 03/24/22  Yes Sharen Hones, MD  rosuvastatin (CRESTOR) 5 MG tablet Take 1 tablet (5 mg total) by mouth at bedtime. 01/16/22  Yes Karamalegos, Devonne Doughty, DO  SPIRIVA HANDIHALER 18 MCG inhalation capsule Place 1 capsule (18 mcg total) into inhaler and inhale daily. Patient taking differently: Place 18 mcg into inhaler and inhale daily as needed. Place 1 capsule (18 mcg total) into inhaler and inhale daily. 06/26/21  Yes Karamalegos, Devonne Doughty, DO  venlafaxine XR (EFFEXOR-XR) 150 MG 24 hr capsule Take total of 225 mg daily, along with 75 mg cap 07/19/21  Yes Karamalegos, Devonne Doughty, DO   Review of Systems  Constitutional:  Positive for fatigue (tire easily). Negative for appetite change.  HENT:  Negative for congestion, postnasal drip and sore throat.   Eyes: Negative.   Respiratory:  Positive for cough and shortness of breath. Negative for wheezing.   Cardiovascular:  Negative for chest pain, palpitations and leg swelling.  Gastrointestinal:  Negative for abdominal distention and abdominal pain.  Endocrine: Negative.   Genitourinary: Negative.   Musculoskeletal:  Negative for back pain and neck pain.  Skin: Negative.   Allergic/Immunologic: Negative.   Neurological:  Positive for light-headedness (with change of position). Negative for dizziness.  Hematological:  Negative for adenopathy. Bruises/bleeds easily.  Psychiatric/Behavioral:  Negative for dysphoric mood and sleep  disturbance (sleeping on 1 pillow). The patient is not nervous/anxious.    Vitals:   04/09/22 1006  BP: 114/66  Pulse: 99  Resp: 18  SpO2: 100%  Weight: 142 lb 2 oz (64.5 kg)  Height: '4\' 10"'$  (1.473 m)   Wt Readings from Last 3 Encounters:  04/09/22 142 lb 2 oz (64.5 kg)  03/27/22 147 lb 12.8 oz (67 kg)  03/23/22 150 lb 5.7 oz (68.2 kg)   Lab Results  Component Value Date   CREATININE 0.81 03/27/2022   CREATININE 0.80 03/23/2022   CREATININE 0.76 03/22/2022   Physical Exam Vitals and nursing note reviewed.  Constitutional:      Appearance: Mackenzie Ward is well-developed.  HENT:     Head: Normocephalic and atraumatic.  Neck:     Vascular: No JVD.  Cardiovascular:     Rate and Rhythm: Normal rate and regular rhythm.  Pulmonary:     Effort: Pulmonary effort is normal.     Breath sounds: No wheezing,  rhonchi or rales.  Abdominal:     General: There is no distension.     Palpations: Abdomen is soft.     Tenderness: There is no abdominal tenderness.  Musculoskeletal:        General: No tenderness.     Cervical back: Normal range of motion and neck supple.     Right lower leg: No edema.     Left lower leg: No edema.  Skin:    General: Skin is warm and dry.  Neurological:     General: No focal deficit present.     Mental Status: Mackenzie Ward is alert and oriented to person, place, and time.  Psychiatric:        Mood and Affect: Mood normal.        Behavior: Behavior normal.        Thought Content: Thought content normal.     Assessment & Plan:  1: Chronic heart failure with preserved ejection fraction without structural changes- - NYHA class III - euvolemic today - weighing daily; reminded to call for an overnight weight gain of > 2 pounds or a weekly weight gain of > 5 pounds - not adding salt and has been reading food labels for sodium content so that Mackenzie Ward can keep daily sodium intake to < '2000mg'$  - participating in [place] HF monitoring program - saw cardiology  Nehemiah Massed) 03/26/22  2: COPD- - has oxygen that Mackenzie Ward can wear - saw PCP [place] Ranger) 03/27/22 - CT scan done 01/02/22  3: Tobacco use- - smokes 1/2 ppd of cigarettes - lives with her sister who also smokes  - patient says that Mackenzie Ward has quit "many times"  - complete cessation discussed for 3 minutes with her  4: Anxiety- - saw behavioral health 04/06/22 - currently taking abilify   Patient did not bring her medications nor a list. Each medication was verbally reviewed with the patient and Mackenzie Ward was encouraged to bring the bottles to every visit to confirm accuracy of list.   Return in 6 weeks, sooner if needed.

## 2022-04-11 ENCOUNTER — Other Ambulatory Visit: Payer: Self-pay | Admitting: Family Medicine

## 2022-04-16 DIAGNOSIS — I509 Heart failure, unspecified: Secondary | ICD-10-CM | POA: Diagnosis not present

## 2022-04-18 ENCOUNTER — Other Ambulatory Visit: Payer: Medicare PPO

## 2022-04-18 DIAGNOSIS — E782 Mixed hyperlipidemia: Secondary | ICD-10-CM | POA: Diagnosis not present

## 2022-04-19 LAB — COMPLETE METABOLIC PANEL WITH GFR
AG Ratio: 1.1 (calc) (ref 1.0–2.5)
ALT: 18 U/L (ref 6–29)
AST: 24 U/L (ref 10–35)
Albumin: 3.5 g/dL — ABNORMAL LOW (ref 3.6–5.1)
Alkaline phosphatase (APISO): 132 U/L (ref 37–153)
BUN: 12 mg/dL (ref 7–25)
CO2: 32 mmol/L (ref 20–32)
Calcium: 9.2 mg/dL (ref 8.6–10.4)
Chloride: 100 mmol/L (ref 98–110)
Creat: 0.73 mg/dL (ref 0.60–1.00)
Globulin: 3.2 g/dL (calc) (ref 1.9–3.7)
Glucose, Bld: 79 mg/dL (ref 65–99)
Potassium: 4.8 mmol/L (ref 3.5–5.3)
Sodium: 139 mmol/L (ref 135–146)
Total Bilirubin: 0.3 mg/dL (ref 0.2–1.2)
Total Protein: 6.7 g/dL (ref 6.1–8.1)
eGFR: 86 mL/min/{1.73_m2} (ref >=60)

## 2022-04-19 LAB — LIPID PANEL
Cholesterol: 148 mg/dL (ref <200)
HDL: 52 mg/dL (ref >=50)
LDL Cholesterol (Calc): 77 mg/dL (calc)
Non-HDL Cholesterol (Calc): 96 mg/dL (calc) (ref <130)
Total CHOL/HDL Ratio: 2.8 (calc) (ref <5.0)
Triglycerides: 103 mg/dL (ref <150)

## 2022-04-24 DIAGNOSIS — I2510 Atherosclerotic heart disease of native coronary artery without angina pectoris: Secondary | ICD-10-CM | POA: Diagnosis not present

## 2022-04-24 DIAGNOSIS — E7800 Pure hypercholesterolemia, unspecified: Secondary | ICD-10-CM | POA: Diagnosis not present

## 2022-04-24 DIAGNOSIS — I5032 Chronic diastolic (congestive) heart failure: Secondary | ICD-10-CM | POA: Diagnosis not present

## 2022-05-10 ENCOUNTER — Telehealth: Payer: Self-pay

## 2022-05-10 DIAGNOSIS — Z0100 Encounter for examination of eyes and vision without abnormal findings: Secondary | ICD-10-CM | POA: Diagnosis not present

## 2022-05-10 DIAGNOSIS — Z961 Presence of intraocular lens: Secondary | ICD-10-CM | POA: Diagnosis not present

## 2022-05-10 DIAGNOSIS — H53002 Unspecified amblyopia, left eye: Secondary | ICD-10-CM | POA: Diagnosis not present

## 2022-05-10 NOTE — Telephone Encounter (Signed)
pharmacy called asked to refill medication for patient.

## 2022-05-10 NOTE — Telephone Encounter (Signed)
Pt was called back and told that that [facility] should have a rx on hold for her that was back in 04-06-22 but it had a notes not to fill until 04-23-22 for a 90 day supply.  ARIPiprazole (ABILIFY) 5 MG tablet Medication Date: 04/06/2022 Department: [facility] Ordering/Authorizing: Norman Clay, MD   Order Providers  Prescribing Provider Encounter Provider  Norman Clay, MD Norman Clay, MD   Outpatient Medication Detail   Disp Refills Start End   ARIPiprazole (ABILIFY) 5 MG tablet 90 tablet 0 04/30/2022 07/29/2022   Sig - Route: Take 1 tablet (5 mg total) by mouth daily. - Oral   Sent to pharmacy as: ARIPiprazole (ABILIFY) 5 MG tablet   Notes to Pharmacy: Fill after 8/14   E-Prescribing Status: Receipt confirmed by pharmacy (04/06/2022 11:26 AM EDT)    Associated Diagnoses  MDD (major depressive disorder), recurrent, in partial remission ([place])

## 2022-05-10 NOTE — Telephone Encounter (Signed)
pt called states that she needed a refill on the avilify .

## 2022-06-03 ENCOUNTER — Other Ambulatory Visit: Payer: Self-pay | Admitting: Psychiatry

## 2022-06-03 DIAGNOSIS — F411 Generalized anxiety disorder: Secondary | ICD-10-CM

## 2022-06-04 ENCOUNTER — Telehealth: Payer: Self-pay | Admitting: Psychiatry

## 2022-06-04 NOTE — Telephone Encounter (Signed)
There should be an order as below. Please contact the pharmacy to verify this.   gabapentin (NEURONTIN) 300 MG capsule 270 capsule 0 04/06/2022 07/05/2022  Sig - Route: Take 1 capsule (300 mg total) by mouth 3 (three) times daily. - Oral  Sent to pharmacy as: gabapentin (NEURONTIN) 300 MG capsule  E-Prescribing Status: Receipt confirmed by pharmacy (04/06/2022 11:26 AM EDT)

## 2022-06-04 NOTE — Telephone Encounter (Signed)
Patient called stating pharmacy told her to call for a refill on her Gabapentin. Please send to [facility] on [address]. Please advise

## 2022-06-06 ENCOUNTER — Ambulatory Visit: Payer: Medicare PPO | Admitting: Family

## 2022-06-06 NOTE — Telephone Encounter (Signed)
Pt picked up medication on 7-30  she should have enough until 10-30

## 2022-06-08 ENCOUNTER — Emergency Department
Admission: EM | Admit: 2022-06-08 | Discharge: 2022-06-10 | Disposition: E | Payer: Medicare PPO | Attending: Student in an Organized Health Care Education/Training Program | Admitting: Student in an Organized Health Care Education/Training Program

## 2022-06-08 ENCOUNTER — Telehealth (INDEPENDENT_AMBULATORY_CARE_PROVIDER_SITE_OTHER): Payer: Medicare PPO | Admitting: Psychiatry

## 2022-06-08 ENCOUNTER — Emergency Department: Payer: Medicare PPO

## 2022-06-08 ENCOUNTER — Encounter: Payer: Self-pay | Admitting: Psychiatry

## 2022-06-08 ENCOUNTER — Other Ambulatory Visit: Payer: Self-pay

## 2022-06-08 DIAGNOSIS — F330 Major depressive disorder, recurrent, mild: Secondary | ICD-10-CM | POA: Diagnosis not present

## 2022-06-08 DIAGNOSIS — J9601 Acute respiratory failure with hypoxia: Secondary | ICD-10-CM | POA: Insufficient documentation

## 2022-06-08 DIAGNOSIS — R0602 Shortness of breath: Secondary | ICD-10-CM | POA: Diagnosis present

## 2022-06-08 DIAGNOSIS — R000 Tachycardia, unspecified: Secondary | ICD-10-CM | POA: Diagnosis not present

## 2022-06-08 DIAGNOSIS — R0902 Hypoxemia: Secondary | ICD-10-CM | POA: Diagnosis not present

## 2022-06-08 DIAGNOSIS — A419 Sepsis, unspecified organism: Secondary | ICD-10-CM | POA: Diagnosis not present

## 2022-06-08 DIAGNOSIS — F411 Generalized anxiety disorder: Secondary | ICD-10-CM

## 2022-06-08 DIAGNOSIS — R778 Other specified abnormalities of plasma proteins: Secondary | ICD-10-CM | POA: Diagnosis not present

## 2022-06-08 DIAGNOSIS — J984 Other disorders of lung: Secondary | ICD-10-CM | POA: Diagnosis not present

## 2022-06-08 DIAGNOSIS — I509 Heart failure, unspecified: Secondary | ICD-10-CM | POA: Insufficient documentation

## 2022-06-08 DIAGNOSIS — R6520 Severe sepsis without septic shock: Secondary | ICD-10-CM | POA: Diagnosis not present

## 2022-06-08 DIAGNOSIS — R6521 Severe sepsis with septic shock: Secondary | ICD-10-CM | POA: Insufficient documentation

## 2022-06-08 DIAGNOSIS — Z452 Encounter for adjustment and management of vascular access device: Secondary | ICD-10-CM | POA: Diagnosis not present

## 2022-06-08 DIAGNOSIS — J449 Chronic obstructive pulmonary disease, unspecified: Secondary | ICD-10-CM | POA: Diagnosis not present

## 2022-06-08 DIAGNOSIS — R0689 Other abnormalities of breathing: Secondary | ICD-10-CM | POA: Diagnosis not present

## 2022-06-08 DIAGNOSIS — I959 Hypotension, unspecified: Secondary | ICD-10-CM | POA: Diagnosis not present

## 2022-06-08 DIAGNOSIS — R069 Unspecified abnormalities of breathing: Secondary | ICD-10-CM | POA: Diagnosis not present

## 2022-06-08 DIAGNOSIS — Z4682 Encounter for fitting and adjustment of non-vascular catheter: Secondary | ICD-10-CM | POA: Diagnosis not present

## 2022-06-08 DIAGNOSIS — R0603 Acute respiratory distress: Secondary | ICD-10-CM | POA: Diagnosis not present

## 2022-06-08 LAB — BLOOD GAS, ARTERIAL
Acid-base deficit: 19.4 mmol/L — ABNORMAL HIGH (ref 0.0–2.0)
Bicarbonate: 13 mmol/L — ABNORMAL LOW (ref 20.0–28.0)
FIO2: 0.75 %
MECHVT: 420 mL
O2 Saturation: 98.4 %
PEEP: 5 cmH2O
Patient temperature: 36
RATE: 16 resp/min
pCO2 arterial: 56 mmHg — ABNORMAL HIGH (ref 32–48)
pH, Arterial: 6.96 — CL (ref 7.35–7.45)
pO2, Arterial: 106 mmHg (ref 83–108)

## 2022-06-08 LAB — CBC WITH DIFFERENTIAL/PLATELET
Abs Immature Granulocytes: 0.12 10*3/uL — ABNORMAL HIGH (ref 0.00–0.07)
Basophils Absolute: 0.1 10*3/uL (ref 0.0–0.1)
Basophils Relative: 1 %
Eosinophils Absolute: 0.1 10*3/uL (ref 0.0–0.5)
Eosinophils Relative: 1 %
HCT: 55.3 % — ABNORMAL HIGH (ref 36.0–46.0)
Hemoglobin: 15.6 g/dL — ABNORMAL HIGH (ref 12.0–15.0)
Immature Granulocytes: 1 %
Lymphocytes Relative: 23 %
Lymphs Abs: 1.9 10*3/uL (ref 0.7–4.0)
MCH: 24.2 pg — ABNORMAL LOW (ref 26.0–34.0)
MCHC: 28.2 g/dL — ABNORMAL LOW (ref 30.0–36.0)
MCV: 85.7 fL (ref 80.0–100.0)
Monocytes Absolute: 1.3 10*3/uL — ABNORMAL HIGH (ref 0.1–1.0)
Monocytes Relative: 15 %
Neutro Abs: 4.9 10*3/uL (ref 1.7–7.7)
Neutrophils Relative %: 59 %
Platelets: 491 10*3/uL — ABNORMAL HIGH (ref 150–400)
RBC: 6.45 MIL/uL — ABNORMAL HIGH (ref 3.87–5.11)
RDW: 19.4 % — ABNORMAL HIGH (ref 11.5–15.5)
WBC: 8.3 10*3/uL (ref 4.0–10.5)
nRBC: 0.2 % (ref 0.0–0.2)

## 2022-06-08 LAB — COMPREHENSIVE METABOLIC PANEL
ALT: 62 U/L — ABNORMAL HIGH (ref 0–44)
AST: 118 U/L — ABNORMAL HIGH (ref 15–41)
Albumin: 3.3 g/dL — ABNORMAL LOW (ref 3.5–5.0)
Alkaline Phosphatase: 103 U/L (ref 38–126)
Anion gap: 19 — ABNORMAL HIGH (ref 5–15)
BUN: 21 mg/dL (ref 8–23)
CO2: 16 mmol/L — ABNORMAL LOW (ref 22–32)
Calcium: 9.2 mg/dL (ref 8.9–10.3)
Chloride: 108 mmol/L (ref 98–111)
Creatinine, Ser: 1.16 mg/dL — ABNORMAL HIGH (ref 0.44–1.00)
GFR, Estimated: 49 mL/min — ABNORMAL LOW (ref >60)
Glucose, Bld: 125 mg/dL — ABNORMAL HIGH (ref 70–99)
Potassium: 5.3 mmol/L — ABNORMAL HIGH (ref 3.5–5.1)
Sodium: 143 mmol/L (ref 135–145)
Total Bilirubin: 1 mg/dL (ref 0.3–1.2)
Total Protein: 8 g/dL (ref 6.5–8.1)

## 2022-06-08 LAB — PROTIME-INR
INR: 2.8 — ABNORMAL HIGH (ref 0.8–1.2)
Prothrombin Time: 29.6 seconds — ABNORMAL HIGH (ref 11.4–15.2)

## 2022-06-08 LAB — URINALYSIS, ROUTINE W REFLEX MICROSCOPIC
Bilirubin Urine: NEGATIVE
Glucose, UA: >=500 mg/dL — AB
Ketones, ur: 5 mg/dL — AB
Leukocytes,Ua: NEGATIVE
Nitrite: NEGATIVE
Protein, ur: 100 mg/dL — AB
Specific Gravity, Urine: 1.023 (ref 1.005–1.030)
pH: 6 (ref 5.0–8.0)

## 2022-06-08 LAB — LACTIC ACID, PLASMA: Lactic Acid, Venous: 6 mmol/L (ref 0.5–1.9)

## 2022-06-08 LAB — APTT: aPTT: 47 seconds — ABNORMAL HIGH (ref 24–36)

## 2022-06-08 LAB — TROPONIN I (HIGH SENSITIVITY): Troponin I (High Sensitivity): 694 ng/L (ref <18)

## 2022-06-08 MED ORDER — LORAZEPAM 1 MG PO TABS: 1.0000 mg | ORAL_TABLET | ORAL | Status: DC | PRN

## 2022-06-08 MED ORDER — SODIUM CHLORIDE 0.9 % IV BOLUS
1000.0000 mL | Freq: Once | INTRAVENOUS | Status: AC
  Administered 2022-06-08: 1000 mL via INTRAVENOUS

## 2022-06-08 MED ORDER — GLYCOPYRROLATE 0.2 MG/ML IJ SOLN: 0.2000 mg | INTRAMUSCULAR | Status: DC | PRN

## 2022-06-08 MED ORDER — SODIUM CHLORIDE 0.9 % IV SOLN
2.0000 g | Freq: Once | INTRAVENOUS | Status: AC
  Administered 2022-06-08: 2 g via INTRAVENOUS
  Filled 2022-06-08: qty 12.5

## 2022-06-08 MED ORDER — HEPARIN (PORCINE) 25000 UT/250ML-% IV SOLN
650.0000 [IU]/h | INTRAVENOUS | Status: DC
  Filled 2022-06-08: qty 250

## 2022-06-08 MED ORDER — ETOMIDATE 2 MG/ML IV SOLN
INTRAVENOUS | Status: AC | PRN
  Administered 2022-06-08: 20 mg via INTRAVENOUS

## 2022-06-08 MED ORDER — MIDAZOLAM-SODIUM CHLORIDE 100-0.9 MG/100ML-% IV SOLN
0.5000 mg/h | INTRAVENOUS | Status: DC
  Filled 2022-06-08: qty 100

## 2022-06-08 MED ORDER — MORPHINE SULFATE (PF) 2 MG/ML IV SOLN: 1.0000 mg | INTRAVENOUS | Status: DC | PRN

## 2022-06-08 MED ORDER — HEPARIN BOLUS VIA INFUSION
3200.0000 [IU] | Freq: Once | INTRAVENOUS | Status: DC
  Filled 2022-06-08: qty 3200

## 2022-06-08 MED ORDER — MORPHINE SULFATE (PF) 4 MG/ML IV SOLN
4.0000 mg | INTRAVENOUS | Status: DC | PRN
  Administered 2022-06-08: 4 mg via INTRAVENOUS
  Filled 2022-06-08: qty 1

## 2022-06-08 MED ORDER — ETOMIDATE 2 MG/ML IV SOLN
INTRAVENOUS | Status: AC
  Filled 2022-06-08: qty 20

## 2022-06-08 MED ORDER — LORAZEPAM 2 MG/ML IJ SOLN: 1.0000 mg | INTRAMUSCULAR | Status: DC | PRN

## 2022-06-08 MED ORDER — SODIUM CHLORIDE 0.9 % IV SOLN: 250.0000 mL | INTRAVENOUS | Status: DC

## 2022-06-08 MED ORDER — SODIUM CHLORIDE 0.9 % IV SOLN
500.0000 mg | Freq: Once | INTRAVENOUS | Status: AC
  Administered 2022-06-08: 500 mg via INTRAVENOUS
  Filled 2022-06-08: qty 5

## 2022-06-08 MED ORDER — SODIUM BICARBONATE 8.4 % IV SOLN
INTRAVENOUS | Status: DC
  Filled 2022-06-08 (×2): qty 1000

## 2022-06-08 MED ORDER — IPRATROPIUM-ALBUTEROL 0.5-2.5 (3) MG/3ML IN SOLN
RESPIRATORY_TRACT | Status: AC
  Administered 2022-06-08: 3 mL
  Filled 2022-06-08: qty 3

## 2022-06-08 MED ORDER — ROCURONIUM BROMIDE 50 MG/5ML IV SOLN
INTRAVENOUS | Status: AC | PRN
  Administered 2022-06-08: 100 mg via INTRAVENOUS

## 2022-06-08 MED ORDER — NOREPINEPHRINE 4 MG/250ML-% IV SOLN: 0.0000 ug/min | INTRAVENOUS | Status: DC

## 2022-06-08 MED ORDER — ROCURONIUM BROMIDE 10 MG/ML (PF) SYRINGE
PREFILLED_SYRINGE | INTRAVENOUS | Status: AC
  Filled 2022-06-08: qty 10

## 2022-06-08 MED ORDER — LORAZEPAM 2 MG/ML PO CONC: 1.0000 mg | ORAL | Status: DC | PRN

## 2022-06-08 MED ORDER — SODIUM BICARBONATE 8.4 % IV SOLN
50.0000 meq | Freq: Once | INTRAVENOUS | Status: AC
  Administered 2022-06-08: 50 meq via INTRAVENOUS

## 2022-06-08 MED ORDER — NOREPINEPHRINE 4 MG/250ML-% IV SOLN
INTRAVENOUS | Status: AC
  Filled 2022-06-08: qty 250

## 2022-06-08 MED ORDER — GLYCOPYRROLATE 1 MG PO TABS: 1.0000 mg | ORAL_TABLET | ORAL | Status: DC | PRN

## 2022-06-08 MED ORDER — VANCOMYCIN HCL IN DEXTROSE 1-5 GM/200ML-% IV SOLN: 1000.0000 mg | Freq: Once | INTRAVENOUS | Status: DC

## 2022-06-08 MED ORDER — NOREPINEPHRINE 4 MG/250ML-% IV SOLN
2.0000 ug/min | INTRAVENOUS | Status: DC
  Administered 2022-06-08: 10 ug/min via INTRAVENOUS

## 2022-06-08 MED ORDER — FENTANYL 2500MCG IN NS 250ML (10MCG/ML) PREMIX INFUSION
0.0000 ug/h | INTRAVENOUS | Status: DC
  Filled 2022-06-08: qty 250

## 2022-06-10 NOTE — Consult Note (Signed)
[facility] for Heparin Indication: chest pain/ACS  No Known Allergies  Patient Measurements: Height: '4\' 10"'$  (147.3 cm) Weight: 59.7 kg (131 lb 9.8 oz) IBW/kg (Calculated) : 40.9 Heparin Dosing Weight: 53.7 kg  Vital Signs: BP: 99/69 (09/29 1416) Pulse Rate: 124 (09/29 1418)  Labs: Recent Labs    07/05/2022 1344  HGB 15.6*  HCT 55.3*  PLT 491*  CREATININE 1.16*  TROPONINIHS 694*    Estimated Creatinine Clearance: 32 mL/min (A) (by C-G formula based on SCr of 1.16 mg/dL (H)).   Medical History: Past Medical History:  Diagnosis Date   Anxiety    Asthma    Bipolar 1 disorder (HCC)    CHF (congestive heart failure) (HCC)    COPD (chronic obstructive pulmonary disease) (HCC)    Depression    Depression    High cholesterol    History of pulmonary embolus (PE)    Thrombosis     Medications:  No known chronic anticoagulation meds PTA  Assessment: 76 y.o. female with history of COPD, CHF, h/o PE presenting with respiratory distress.  Pharmacy consulted for management of heparin drip.   Goal of Therapy:  Heparin level 0.3-0.7 units/ml Monitor platelets by anticoagulation protocol: Yes   Plan:  Give 3200 units bolus x 1 Start heparin infusion at 650 units/hr Check anti-Xa level in 8 hours and daily while on heparin Continue to monitor H&H and platelets  Alison Murray 07-05-22,3:19 PM

## 2022-06-10 NOTE — Consult Note (Signed)
PHARMACY -  BRIEF ANTIBIOTIC NOTE   Pharmacy has received consult(s) for vancomycin and cefepime from an ED provider.  The patient's profile has been reviewed for ht/wt/allergies/indication/available labs.    One time order(s) placed for  Vancomycin 1gm IV x1 Cefepime 2gm IV x1 Azithromycin '500mg'$  IV x1  Further antibiotics/pharmacy consults should be ordered by admitting physician if indicated.                       Thank you, Alison Murray 06/11/22  2:25 PM

## 2022-06-10 NOTE — Progress Notes (Signed)
Virtual Visit via Video Note  I connected with Mackenzie Ward on 06-26-2022 at  9:00 AM EDT by a video enabled telemedicine application and verified that I am speaking with the correct person using two identifiers.  Location: Patient: home Provider: office Persons participated in the visit- patient, provider    I discussed the limitations of evaluation and management by telemedicine and the availability of in person appointments. The patient expressed understanding and agreed to proceed.   I discussed the assessment and treatment plan with the patient. The patient was provided an opportunity to ask questions and all were answered. The patient agreed with the plan and demonstrated an understanding of the instructions.   The patient was advised to call back or seek an in-person evaluation if the symptoms worsen or if the condition fails to improve as anticipated.  I provided 17 minutes of non-face-to-face time during this encounter.   Mackenzie Clay, MD    [facility] MD/PA/NP OP Progress Note  06/26/2022 9:38 AM Mackenzie Ward  MRN:  [id number]  Chief Complaint:  Chief Complaint  Patient presents with   Follow-up   Depression   HPI:  This is a follow-up appointment for depression and anxiety.  She states that she is not feeling good since coming back from cruise.  She has more shortness of breath, and has significant exhaustion.  Although she did contact her cardiologist a few days ago, she was advised to get rest and continue to take her medication.  Although she denies feeling worse over the past few days, it is not improving.  She tends to sleep more, and has cough.  She eats only a few bites for the past several days. Her sister had covid, and she has not tested herself.  She agrees to contact her provider again to update.  She states that she wants to feel better and wants to do things, although she is unable to do the things due to exhaustion.  She denies SI.  She denies AH, VH.  She  feels anxious.  She states that she will be running out of gabapentin.  She takes hydroxyzine 3 times a day for anxiety.  She agrees to limit the use of it.  She agrees to stay on the other medication.     Daily routine: household chores,  Exercise: Employment: Retired in 2010. 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 Married twice before Number of children: 0 She grew up in [place]. She had "good childhood." She lost her mother in Nov 11, 2020. Her father deceased in 86.  Visit Diagnosis:    ICD-10-CM   1. MDD (major depressive disorder), recurrent episode, mild ([place])  F33.0     2. Anxiety state  F41.1       Past Psychiatric History: Please see initial evaluation for full details. I have reviewed the history. No updates at this time.     Past Medical History:  Past Medical History:  Diagnosis Date   Anxiety    Asthma    Bipolar 1 disorder (HCC)    CHF (congestive heart failure) (HCC)    COPD (chronic obstructive pulmonary disease) (HCC)    Depression    Depression    High cholesterol    History of pulmonary  embolus (PE)    Thrombosis     Past Surgical History:  Procedure Laterality Date   BREAST SURGERY Right 2013   CATARACT EXTRACTION W/PHACO Left 08/26/2019   Procedure: CATARACT EXTRACTION PHACO AND INTRAOCULAR LENS PLACEMENT (IOC) LEFT  4.45    00:40.5    11.0%;  Surgeon: Leandrew Koyanagi, MD;  Location: [facility];  Service: Ophthalmology;  Laterality: Left;   CATARACT EXTRACTION W/PHACO Right 09/30/2019   Procedure: CATARACT EXTRACTION PHACO AND INTRAOCULAR LENS PLACEMENT (IOC) RIGHT 4.05  00:44.4  9.1% ;  Surgeon: Leandrew Koyanagi, MD;  Location: [facility];  Service: Ophthalmology;  Laterality:  Right;   COLONOSCOPY  15 years ago   COLONOSCOPY WITH PROPOFOL N/A 11/10/2018   Procedure: COLONOSCOPY WITH PROPOFOL;  Surgeon: Lin Landsman, MD;  Location: [facility] ENDOSCOPY;  Service: Gastroenterology;  Laterality: N/A;   EYE SURGERY     TONSILLECTOMY      Family Psychiatric History: Please see initial evaluation for full details. I have reviewed the history. No updates at this time.     Family History:  Family History  Problem Relation Age of Onset   Lymphoma Mother        Non Hodgkin   Dementia Mother    Alzheimer's disease Mother    Lymphoma Sister        Non Hodgkin   Depression Sister    Heart attack Father    Alcohol abuse Father    Dementia Brother    Dementia Maternal Uncle    Alzheimer's disease Maternal Aunt    Alzheimer's disease Paternal Aunt    Alzheimer's disease Paternal Grandmother    Alzheimer's disease Maternal Aunt    Alzheimer's disease Paternal Aunt     Social History:  Social History   Socioeconomic History   Marital status: Widowed    Spouse name: Not on file   Number of children: Not on file   Years of education: Not on file   Highest education level: Some college, no degree  Occupational History   Occupation: retired  Tobacco Use   Smoking status: Every Day    Packs/day: 1.00    Years: 45.00    Total pack years: 45.00    Types: Cigarettes   Smokeless tobacco: Never  Vaping Use   Vaping Use: Never used  Substance and Sexual Activity   Alcohol use: No    Alcohol/week: 0.0 standard drinks of alcohol   Drug use: No   Sexual activity: Not Currently  Other Topics Concern   Not on file  Social History Narrative   Goes to dinner with friends often, goes to see her mom at nursing home on a regular basis .         Pt plans on taking a trip to [place] 05/2022.   Social Determinants of Health   Financial Resource Strain: Low Risk  (11/16/2021)   Overall Financial Resource Strain (CARDIA)    Difficulty of Paying Living Expenses: Not  hard at all  Food Insecurity: No Food Insecurity (11/16/2021)   Hunger Vital Sign    Worried About Running Out of Food in the Last Year: Never true    Ran Out of Food in the Last Year: Never true  Transportation Needs: No Transportation Needs (11/16/2021)   PRAPARE - Hydrologist (Medical): No    Lack of Transportation (Non-Medical): No  Physical Activity: Inactive (11/16/2021)   Exercise Vital Sign    Days of Exercise per Week: 0  days    Minutes of Exercise per Session: 0 min  Stress: Stress Concern Present (11/16/2021)   [place]    Feeling of Stress : To some extent  Social Connections: Socially Isolated (11/16/2021)   Social Connection and Isolation Panel [NHANES]    Frequency of Communication with Friends and Family: Twice a week    Frequency of Social Gatherings with Friends and Family: Never    Attends Religious Services: Never    Marine scientist or Organizations: No    Attends Archivist Meetings: Never    Marital Status: Widowed    Allergies: No Known Allergies  Metabolic Disorder Labs: Lab Results  Component Value Date   HGBA1C 5.6 01/08/2022   MPG 114 01/08/2022   MPG 105 01/09/2021   No results found for: "PROLACTIN" Lab Results  Component Value Date   CHOL 148 04/18/2022   TRIG 103 04/18/2022   HDL 52 04/18/2022   CHOLHDL 2.8 04/18/2022   VLDL 27 11/05/2016   LDLCALC 77 04/18/2022   LDLCALC 173 (H) 01/08/2022   Lab Results  Component Value Date   TSH 2.30 01/08/2022   TSH 3.98 01/09/2021    Therapeutic Level Labs: Lab Results  Component Value Date   LITHIUM 0.7 04/13/2021   No results found for: "VALPROATE" No results found for: "CBMZ"  Current Medications: Current Outpatient Medications  Medication Sig Dispense Refill   acetaminophen (TYLENOL) 500 MG tablet Take 1,000 mg by mouth every 8 (eight) hours as needed.     albuterol (PROVENTIL)  (2.5 MG/3ML) 0.083% nebulizer solution Take 3 mLs (2.5 mg total) by nebulization every 6 (six) hours as needed for wheezing or shortness of breath. 60 mL 1   ARIPiprazole (ABILIFY) 5 MG tablet Take 1 tablet (5 mg total) by mouth daily. 90 tablet 0   empagliflozin (JARDIANCE) 10 MG TABS tablet Take 1 tablet by mouth daily.     furosemide (LASIX) 40 MG tablet Take 1 tablet (40 mg total) by mouth daily. 30 tablet 0   gabapentin (NEURONTIN) 300 MG capsule Take 1 capsule (300 mg total) by mouth 3 (three) times daily. 270 capsule 0   hydrOXYzine (ATARAX/VISTARIL) 10 MG tablet Take 1 tablet (10 mg total) by mouth 3 (three) times daily as needed for anxiety. 270 tablet 3   Magnesium Hydroxide (MAGNESIA PO) Take 3 capsules by mouth daily as needed.     metoprolol succinate (TOPROL-XL) 100 MG 24 hr tablet Take 1 tablet (100 mg total) by mouth daily.     potassium chloride SA (KLOR-CON M) 20 MEQ tablet Take 1 tablet (20 mEq total) by mouth daily. 30 tablet 0   rosuvastatin (CRESTOR) 5 MG tablet Take 1 tablet (5 mg total) by mouth at bedtime. 90 tablet 3   SPIRIVA HANDIHALER 18 MCG inhalation capsule Place 1 capsule (18 mcg total) into inhaler and inhale daily. (Patient taking differently: Place 18 mcg into inhaler and inhale daily as needed. Place 1 capsule (18 mcg total) into inhaler and inhale daily.) 90 capsule 1   venlafaxine XR (EFFEXOR-XR) 150 MG 24 hr capsule Take total of 225 mg daily, along with 75 mg cap 90 capsule 3   No current facility-administered medications for this visit.     Musculoskeletal: Strength & Muscle Tone:  N/A Gait & Station:  N/A Patient leans: N/A  Psychiatric Specialty Exam: Review of Systems  Psychiatric/Behavioral:  Positive for dysphoric mood and sleep disturbance. Negative for agitation,  behavioral problems, confusion, decreased concentration, hallucinations, self-injury and suicidal ideas. The patient is nervous/anxious. The patient is not hyperactive.   All other  systems reviewed and are negative.   There were no vitals taken for this visit.There is no height or weight on file to calculate BMI.  General Appearance: Fairly Groomed  Eye Contact:  Good  Speech:  Clear and Coherent  Volume:  Normal  Mood:   not good  Affect:  Appropriate, Congruent, and fatigue  Thought Process:  Coherent  Orientation:  Full (Time, Place, and Person)  Thought Content: Logical   Suicidal Thoughts:  No  Homicidal Thoughts:  No  Memory:  Immediate;   Good  Judgement:  Good  Insight:  Good  Psychomotor Activity:  Normal  Concentration:  Concentration: Good and Attention Span: Good  Recall:  Good  Fund of Knowledge: Good  Language: Good  Akathisia:  No  Handed:  Right  AIMS (if indicated): not done  Assets:  Communication Skills Desire for Improvement  ADL's:  Intact  Cognition: WNL  Sleep:  Poor   Screenings: GAD-7    Flowsheet Row Office Visit from 04/09/2022 in [facility] Office Visit from 03/27/2022 in [facility] Office Visit from 01/16/2022 in [facility] Office Visit from 01/07/2020 in [facility]  Total GAD-7 Score 0 '4 5 3      '$ PHQ2-9    [place] Office Visit from 03/27/2022 in [facility] Office Visit from 01/16/2022 in [facility] Office Visit from 10/26/2021 in [facility] Video Visit from 06/23/2021 in [facility] Office Visit from 04/24/2021 in [facility]  PHQ-2 Total Score 0 '2 3 1 1  '$ PHQ-9 Total Score '6 7 15 '$ -- --      Flowsheet Row ED to Hosp-Admission (Discharged) from 03/19/2022 in [facility] Office Visit from 10/26/2021 in [facility] Video Visit from 06/23/2021 in [facility] No Risk No Risk No Risk        Assessment and Plan:   TESSLYN BAUMERT is a 76 y.o. year old female with a history of depression, Heart Failure with Preserved Ejection Fraction (HFpEF), COPD, hyperlipidemia, chronic low back pain, who presents for follow up appointment for below.   1. MDD (major depressive disorder), recurrent episode, mild ([place]) 2. Anxiety state She reports worsening in depressive symptoms in the context of having physical symptoms of significant exertion, shortness of breath. Psychosocial stressors includes loss of her husband in July, chronic pain.  Given it is difficult to discern whether her current mood symptoms are more secondary to physical symptoms, we will not make adjustment in her medication at this time.  Will continue venlafaxine and Abilify to target depression.  Continue gabapentin for anxiety and neuropathic pain.  Will continue hydroxyzine as needed for anxiety; she was advised to taper down the dose given her current physical condition to avoid risk of confusion.   # fatigue, shortness of breath She was advised to contact her cardiologist for further evaluation given her symptoms.  # Cognitive deficit Unchanged. She reports occasional word-finding difficulty.  IADL is independent.  Although she was advised to obtain labs, she was not interested in this .  Will continue to monitor.    Plan   Continue venlafaxine 225 mg daily Continue Abilify 5 mg at night  Continue gabapentin 300  mg three times a day -contacted the pharmacy. She filled on 7/30 for 90 days.  She was notified of this, and was advised to find another bottle of the medication as it is usually dispensed in 2 bottles.  Decrease hydroxyzine 10 mg daily as needed for anxiety (was on three times a day prn) Next appointment: 11/3 for 30 mins, video [email or web address]. [phone] (sister)     Past trials of medication- quetiapine    The patient demonstrates the following risk factors for suicide: Chronic risk factors for suicide include:  psychiatric disorder of bipolar disorder and chronic pain. Acute risk factors for suicide include: unemployment. Protective factors for this patient include: positive social support, responsibility to others (children, family) and hope for the future. Considering these factors, the overall suicide risk at this point appears to be low. Patient is appropriate for outpatient follow up.             Collaboration of Care: Collaboration of Care: Other reviewed notes in Epic  Patient/Guardian was advised Release of Information must be obtained prior to any record release in order to collaborate their care with an outside provider. Patient/Guardian was advised if they have not already done so to contact the registration department to sign all necessary forms in order for [place] to release information regarding their care.   Consent: Patient/Guardian gives verbal consent for treatment and assignment of benefits for services provided during this visit. Patient/Guardian expressed understanding and agreed to proceed.    Mackenzie Clay, MD 2022/06/09, 9:38 AM

## 2022-06-10 NOTE — Progress Notes (Signed)
07/05/2022 1820: Received a phone call from [place]. [phone]. She stated she wanted toxicology done on the patient as she was concerned the patient had been exposed to poisoned eyedrops. She stated her father in law had died from that last year. I explained once someone was deceased, we could not process lab work, but would contact the [employer]ar[place] manager. 07-05-2022 1833 Called [facility] on call medical examiner Arnoldo Morale. Explained family concerns. She stated based on the patients past medical history, and presenting symptoms and note from the EDP, that it was not a medical examiners case, and they would have to pursue private toxicology. [phone] Returned call to [employer]. Explained medical examiner declined her MIL as a case. Explained they would need to pursue private toxicology testing. She asked if I knew how that was done, and I did not know.

## 2022-06-10 NOTE — ED Notes (Signed)
1442: CPR initiated 1444: Epi given 1445:CA++ given 1447: [place]

## 2022-06-10 NOTE — ED Provider Notes (Addendum)
[facility] Provider Note    Event Date/Time   First MD Initiated Contact with Patient Jul 03, 2022 1330     (approximate)   History   Respiratory Distress  Level V Caveat:  AMS  HPI  Mackenzie Ward is a 76 y.o. female with history of COPD CHF presents to the ER for evaluation of shortness of breath.  Arrives in respiratory distress with EMS on CPAP.  Able to to follow commands give thumbs.  Nods that she is feeling short of breath denies any pain.  Reportedly did have recent COVID-positive test.     Physical Exam   Triage Vital Signs: ED Triage Vitals  Enc Vitals Group     BP July 03, 2022 1335 126/84     Pulse Rate 07-03-22 1335 (!) 125     Resp 2022/07/03 1335 (!) 47     Temp --      Temp src --      SpO2 07-03-2022 1335 (!) 85 %     Weight July 03, 2022 1336 131 lb 9.8 oz (59.7 kg)     Height 07/03/2022 1336 '4\' 10"'$  (1.473 m)     Head Circumference --      Peak Flow --      Pain Score 07/03/22 1336 0     Pain Loc --      Pain Edu? --      Excl. in [place]? --     Most recent vital signs: Vitals:   07/03/22 1418 03-Jul-2022 1430  BP:    Pulse: (!) 124   Resp: (!) 35   SpO2: 97% 99%     Constitutional: critically ill-appearing Eyes: Conjunctivae are normal.  Head: Atraumatic. Nose: No congestion/rhinnorhea. Mouth/Throat: Mucous membranes are moist.   Neck: Painless ROM.  Cardiovascular:   Mottled cool periphery.  Palpable carotid femoral and radial pulse Respiratory: Sniffing and tachypnea with expiratory wheeze. Gastrointestinal: Soft and nontender.  Musculoskeletal:  no deformity    ED Results / Procedures / Treatments   Labs (all labs ordered are listed, but only abnormal results are displayed) Labs Reviewed  CBC WITH DIFFERENTIAL/PLATELET - Abnormal; Notable for the following components:      Result Value   RBC 6.45 (*)    Hemoglobin 15.6 (*)    HCT 55.3 (*)    MCH 24.2 (*)    MCHC 28.2 (*)    RDW 19.4 (*)    Platelets 491 (*)     Monocytes Absolute 1.3 (*)    Abs Immature Granulocytes 0.12 (*)    All other components within normal limits  COMPREHENSIVE METABOLIC PANEL - Abnormal; Notable for the following components:   Potassium 5.3 (*)    CO2 16 (*)    Glucose, Bld 125 (*)    Creatinine, Ser 1.16 (*)    Albumin 3.3 (*)    AST 118 (*)    ALT 62 (*)    GFR, Estimated 49 (*)    Anion gap 19 (*)    All other components within normal limits  URINALYSIS, ROUTINE W REFLEX MICROSCOPIC - Abnormal; Notable for the following components:   Color, Urine YELLOW (*)    APPearance HAZY (*)    Glucose, UA >=500 (*)    Hgb urine dipstick SMALL (*)    Ketones, ur 5 (*)    Protein, ur 100 (*)    Bacteria, UA RARE (*)    All other components within normal limits  LACTIC ACID, PLASMA - Abnormal; Notable for the  following components:   Lactic Acid, Venous 6.0 (*)    All other components within normal limits  PROTIME-INR - Abnormal; Notable for the following components:   Prothrombin Time 29.6 (*)    INR 2.8 (*)    All other components within normal limits  BLOOD GAS, ARTERIAL - Abnormal; Notable for the following components:   pH, Arterial 6.96 (*)    pCO2 arterial 56 (*)    Bicarbonate 13.0 (*)    Acid-base deficit 19.4 (*)    All other components within normal limits  APTT - Abnormal; Notable for the following components:   aPTT 47 (*)    All other components within normal limits  TROPONIN I (HIGH SENSITIVITY) - Abnormal; Notable for the following components:   Troponin I (High Sensitivity) 694 (*)    All other components within normal limits  RESP PANEL BY RT-PCR (FLU A&B, COVID) ARPGX2  LACTIC ACID, PLASMA  HEPARIN LEVEL (UNFRACTIONATED)  TROPONIN I (HIGH SENSITIVITY)     EKG  ED ECG REPORT I, Merlyn Lot, the attending physician, personally viewed and interpreted this ECG.   Date: 2022-07-01  EKG Time: 13:35  Rate: 125  Rhythm: sinus vs afib  Axis: left  Intervals: normal  ST&T Change:  difficult to assess 2/2 artifact    RADIOLOGY Please see ED Course for my review and interpretation.  I personally reviewed all radiographic images ordered to evaluate for the above acute complaints and reviewed radiology reports and findings.  These findings were personally discussed with the patient.  Please see medical record for radiology report.    PROCEDURES:  Critical Care performed: Yes, see critical care procedure note(s)  Procedure Name: Intubation Date/Time: 07-01-2022 3:16 PM  Performed by: Merlyn Lot, MDPre-anesthesia Checklist: Patient identified, Emergency Drugs available, Suction available and Patient being monitored Preoxygenation: Pre-oxygenation with 100% oxygen Induction Type: IV induction and Rapid sequence Laryngoscope Size: Glidescope and 3 Tube size: 7.5 mm Number of attempts: 1 Airway Equipment and Method: Video-laryngoscopy Placement Confirmation: ETT inserted through vocal cords under direct vision, CO2 detector and Breath sounds checked- equal and bilateral Dental Injury: Teeth and Oropharynx as per pre-operative assessment     .Critical Care  Performed by: Merlyn Lot, MD Authorized by: Merlyn Lot, MD   Critical care provider statement:    Critical care time (minutes):  45   Critical care was necessary to treat or prevent imminent or life-threatening deterioration of the following conditions:  Respiratory failure   Critical care was time spent personally by me on the following activities:  Ordering and performing treatments and interventions, ordering and review of laboratory studies, ordering and review of radiographic studies, pulse oximetry, re-evaluation of patient's condition, review of old charts, obtaining history from patient or surrogate, examination of patient, evaluation of patient's response to treatment, discussions with primary provider, discussions with consultants and development of treatment plan with patient or  surrogate    MEDICATIONS ORDERED IN ED: Medications  vancomycin (VANCOCIN) IVPB 1000 mg/200 mL premix (has no administration in time range)  fentaNYL 2570mg in NS 2557m(1011mml) infusion-PREMIX (has no administration in time range)  midazolam (VERSED) 100 mg/100 mL (1 mg/mL) premix infusion (has no administration in time range)  0.9 %  sodium chloride infusion (has no administration in time range)  norepinephrine (LEVOPHED) '4mg'$  in 250m17m.016 mg/mL) premix infusion (10 mcg/min Intravenous New Bag/Given 9/29Oct 22, 20238)  heparin bolus via infusion 3,200 Units (has no administration in time range)  heparin ADULT infusion 100 units/mL (25000 units/250mL26m  has no administration in time range)  sodium bicarbonate 150 mEq in dextrose 5 % 1,150 mL infusion (has no administration in time range)  morphine (PF) 4 MG/ML injection 4 mg (has no administration in time range)  morphine (PF) 2 MG/ML injection 1 mg (has no administration in time range)  LORazepam (ATIVAN) tablet 1 mg (has no administration in time range)    Or  LORazepam (ATIVAN) 2 MG/ML concentrated solution 1 mg (has no administration in time range)    Or  LORazepam (ATIVAN) injection 1 mg (has no administration in time range)  glycopyrrolate (ROBINUL) tablet 1 mg (has no administration in time range)    Or  glycopyrrolate (ROBINUL) injection 0.2 mg (has no administration in time range)    Or  glycopyrrolate (ROBINUL) injection 0.2 mg (has no administration in time range)  ipratropium-albuterol (DUONEB) 0.5-2.5 (3) MG/3ML nebulizer solution (3 mLs  Given 06-14-2022 1347)  ceFEPIme (MAXIPIME) 2 g in sodium chloride 0.9 % 100 mL IVPB (0 g Intravenous Stopped 06-14-2022 1526)  azithromycin (ZITHROMAX) 500 mg in sodium chloride 0.9 % 250 mL IVPB (0 mg Intravenous Stopped 06/14/22 1545)  etomidate (AMIDATE) injection (20 mg Intravenous Given Jun 14, 2022 1420)  rocuronium (ZEMURON) injection (100 mg Intravenous Given 06/14/22 1420)  sodium chloride  0.9 % bolus 1,000 mL (1,000 mLs Intravenous New Bag/Given 14-Jun-2022 1440)  sodium chloride 0.9 % bolus 1,000 mL (0 mLs Intravenous Stopped June 14, 2022 1527)  sodium bicarbonate injection 50 mEq (50 mEq Intravenous Given Jun 14, 2022 1526)     IMPRESSION / MDM / ASSESSMENT AND PLAN / ED COURSE  I reviewed the triage vital signs and the nursing notes.                              Differential diagnosis includes, but is not limited to, Asthma, copd, CHF, pna, ptx, malignancy, Pe, anemia  Patient presenting to the ER for evaluation of symptoms as described above.  Base on symptoms, risk factors and considered above differential, this presenting complaint could reflect a potentially life-threatening illness therefore the patient will be placed on continuous pulse oximetry and telemetry for monitoring.  Laboratory evaluation will be sent to evaluate for the above complaints.  Patient is critically ill appearing appears to be protecting her airway and is following commands.  Will trial on BiPAP. Will give fluids, nebs, reassess    Clinical Course as of 06/14/2022 1626  06/14/22  1356 Chest x-ray on my review and interpretation is concerning for pneumonia. [PR]  4259 Patient's O2 saturations are stabilizing at 100% on maximal FiO2 and BiPAP support her respirations remain in the high 40s to 50s.  We will plan intubation etc. believe she can sustain this respiratory effort. [PR]  1435 Patient intubated with episode of post intubation hypotension resolved with IV fluids. [PR]  1451 Patient with episode of loss of blood pressure.  No palpable pulse.  PEA arrest. CPR started by nurses given 1 dose of epinephrine as well as calcium.  On recheck had return of spontaneous circulation.  X-ray confirms ET tube placement.  She is ventilating well. [PR]  1453 Attempted to reach patient's family.  No voicemail. [PR]  1501 Nor epi is ordered but has not been started as her p.o. blood pressures are improved.  Has  metabolic acidosis but lactate still pending. [PR]  1513 Given consideration of PE and elevated troponin will order heparin.  Consult with intensivist who agreed admit  patient to the ICU. [PR]  1525 Patient with significant metabolic acidosis.  Not in DKA.  Will get dose of bicarb awaiting CT imaging. [PR]  8315 Able to contact patient's sister who is her POA.  Discussed the very poor prognosis and very low likelihood of survival outside of the hospital.  She states that in this setting the patient would not want to be resuscitated would not want to be kept on a ventilator and would want to be made comfort measures.  Will stop resuscitative measures at this time.  We will give IV pain medication.  We will withdrawal care. [PR]  1626 Time of death 16:12 [PR]    Clinical Course User Index [PR] Merlyn Lot, MD    FINAL CLINICAL IMPRESSION(S) / ED DIAGNOSES   Final diagnoses:  Acute respiratory failure with hypoxia ([place])  Sepsis with acute hypoxic respiratory failure, due to unspecified organism, unspecified whether septic shock present [facility])     Rx / DC Orders   ED Discharge Orders     None        Note:  This document was prepared using Dragon voice recognition software and may include unintentional dictation errors.       Merlyn Lot, MD 04-Jul-2022 1558    Merlyn Lot, MD 07-04-2022 1626

## 2022-06-10 NOTE — ED Triage Notes (Signed)
Pt presents to the ED due to respiratory distress. Pt is coming from was diagnosed with COVID on Saturday. Was [place] a cruise [place] 2 [place]. Pt is currently on BiPAP.Pt is responsive. No family currently at bedside.

## 2022-06-10 NOTE — ED Notes (Addendum)
This RN was given the order to place pt on comfort care. Vent and medication was turned off. Pt was extubated

## 2022-06-10 DEATH — deceased

## 2022-06-18 ENCOUNTER — Ambulatory Visit: Payer: Medicare PPO | Admitting: Family

## 2022-07-02 ENCOUNTER — Other Ambulatory Visit: Payer: Self-pay | Admitting: Family Medicine

## 2022-07-02 DIAGNOSIS — F413 Other mixed anxiety disorders: Secondary | ICD-10-CM

## 2022-07-13 ENCOUNTER — Telehealth: Payer: Medicare PPO | Admitting: Psychiatry

## 2022-07-23 ENCOUNTER — Ambulatory Visit: Payer: Medicare PPO | Admitting: Family Medicine

## 2022-07-26 ENCOUNTER — Other Ambulatory Visit: Payer: Self-pay | Admitting: Family Medicine

## 2022-07-26 DIAGNOSIS — F3170 Bipolar disorder, currently in remission, most recent episode unspecified: Secondary | ICD-10-CM

## 2022-07-26 DIAGNOSIS — F411 Generalized anxiety disorder: Secondary | ICD-10-CM
